# Patient Record
Sex: Male | Born: 1970 | Hispanic: Yes | Marital: Married | Smoking: Former smoker
Health system: Southern US, Community
[De-identification: ages and names within clinical notes are randomized; demographics above are authoritative.]

## PROBLEM LIST (undated history)

## (undated) DIAGNOSIS — T8859XA Other complications of anesthesia, initial encounter: Secondary | ICD-10-CM

## (undated) DIAGNOSIS — S3609XA Other injury of spleen, initial encounter: Secondary | ICD-10-CM

## (undated) DIAGNOSIS — F1011 Alcohol abuse, in remission: Secondary | ICD-10-CM

## (undated) DIAGNOSIS — K759 Inflammatory liver disease, unspecified: Secondary | ICD-10-CM

## (undated) DIAGNOSIS — F329 Major depressive disorder, single episode, unspecified: Secondary | ICD-10-CM

## (undated) DIAGNOSIS — T4145XA Adverse effect of unspecified anesthetic, initial encounter: Secondary | ICD-10-CM

## (undated) DIAGNOSIS — T50901A Poisoning by unspecified drugs, medicaments and biological substances, accidental (unintentional), initial encounter: Secondary | ICD-10-CM

## (undated) DIAGNOSIS — J45909 Unspecified asthma, uncomplicated: Secondary | ICD-10-CM

## (undated) DIAGNOSIS — F1020 Alcohol dependence, uncomplicated: Secondary | ICD-10-CM

## (undated) HISTORY — DX: Major depressive disorder, single episode, unspecified: F32.9

## (undated) HISTORY — PX: NO PAST SURGERIES: SHX2092

## (undated) HISTORY — PX: OTHER SURGICAL HISTORY: SHX169

---

## 2009-05-24 ENCOUNTER — Emergency Department (HOSPITAL_COMMUNITY): Admission: EM | Admit: 2009-05-24 | Discharge: 2009-05-24 | Payer: Self-pay | Admitting: Emergency Medicine

## 2009-05-31 ENCOUNTER — Emergency Department (HOSPITAL_COMMUNITY): Admission: EM | Admit: 2009-05-31 | Discharge: 2009-06-01 | Payer: Self-pay | Admitting: Internal Medicine

## 2009-07-19 ENCOUNTER — Emergency Department (HOSPITAL_COMMUNITY): Admission: EM | Admit: 2009-07-19 | Discharge: 2009-07-19 | Payer: Self-pay | Admitting: Emergency Medicine

## 2009-08-03 ENCOUNTER — Emergency Department (HOSPITAL_COMMUNITY): Admission: EM | Admit: 2009-08-03 | Discharge: 2009-08-03 | Payer: Self-pay | Admitting: Emergency Medicine

## 2009-08-13 ENCOUNTER — Encounter (INDEPENDENT_AMBULATORY_CARE_PROVIDER_SITE_OTHER): Payer: Self-pay | Admitting: Family Medicine

## 2009-08-13 ENCOUNTER — Ambulatory Visit: Payer: Self-pay | Admitting: Internal Medicine

## 2009-08-13 LAB — CONVERTED CEMR LAB
ALT: 87 units/L — ABNORMAL HIGH (ref 0–53)
AST: 159 units/L — ABNORMAL HIGH (ref 0–37)
Albumin: 4.3 g/dL (ref 3.5–5.2)
Alkaline Phosphatase: 92 units/L (ref 39–117)
BUN: 16 mg/dL (ref 6–23)
Basophils Absolute: 0 10*3/uL (ref 0.0–0.1)
Basophils Relative: 0 % (ref 0–1)
CO2: 26 meq/L (ref 19–32)
Calcium: 8.8 mg/dL (ref 8.4–10.5)
Chloride: 105 meq/L (ref 96–112)
Creatinine, Ser: 0.87 mg/dL (ref 0.40–1.50)
Eosinophils Absolute: 0.2 10*3/uL (ref 0.0–0.7)
Eosinophils Relative: 3 % (ref 0–5)
Glucose, Bld: 91 mg/dL (ref 70–99)
HCT: 45.5 % (ref 39.0–52.0)
HCV Ab: REACTIVE — AB
HCV Quantitative: 8190000 intl units/mL — ABNORMAL HIGH (ref <43)
Hemoglobin: 14.8 g/dL (ref 13.0–17.0)
Hep A Total Ab: NEGATIVE
Hep B Core Total Ab: POSITIVE — AB
Hep B S Ab: POSITIVE — AB
Lymphocytes Relative: 44 % (ref 12–46)
Lymphs Abs: 3 10*3/uL (ref 0.7–4.0)
MCHC: 32.5 g/dL (ref 30.0–36.0)
MCV: 97.6 fL (ref 78.0–100.0)
Monocytes Absolute: 1.2 10*3/uL — ABNORMAL HIGH (ref 0.1–1.0)
Monocytes Relative: 18 % — ABNORMAL HIGH (ref 3–12)
Neutro Abs: 2.4 10*3/uL (ref 1.7–7.7)
Neutrophils Relative %: 35 % — ABNORMAL LOW (ref 43–77)
Platelets: 210 10*3/uL (ref 150–400)
Potassium: 5 meq/L (ref 3.5–5.3)
RBC: 4.66 M/uL (ref 4.22–5.81)
RDW: 14.5 % (ref 11.5–15.5)
Sodium: 140 meq/L (ref 135–145)
Total Bilirubin: 0.8 mg/dL (ref 0.3–1.2)
Total Protein: 8 g/dL (ref 6.0–8.3)
WBC: 6.8 10*3/uL (ref 4.0–10.5)

## 2010-06-05 ENCOUNTER — Emergency Department (HOSPITAL_COMMUNITY)
Admission: EM | Admit: 2010-06-05 | Discharge: 2010-06-06 | Payer: Self-pay | Source: Home / Self Care | Admitting: Emergency Medicine

## 2010-06-30 ENCOUNTER — Emergency Department (HOSPITAL_COMMUNITY)
Admission: EM | Admit: 2010-06-30 | Discharge: 2010-07-01 | Payer: Self-pay | Source: Home / Self Care | Admitting: Emergency Medicine

## 2010-07-08 LAB — COMPREHENSIVE METABOLIC PANEL
ALT: 141 U/L — ABNORMAL HIGH (ref 0–53)
AST: 221 U/L — ABNORMAL HIGH (ref 0–37)
Albumin: 3.4 g/dL — ABNORMAL LOW (ref 3.5–5.2)
Alkaline Phosphatase: 116 U/L (ref 39–117)
BUN: 12 mg/dL (ref 6–23)
CO2: 22 mEq/L (ref 19–32)
Calcium: 8.5 mg/dL (ref 8.4–10.5)
Chloride: 106 mEq/L (ref 96–112)
Creatinine, Ser: 0.67 mg/dL (ref 0.4–1.5)
GFR calc Af Amer: >60 mL/min (ref >60)
GFR calc non Af Amer: >60 mL/min (ref >60)
Glucose, Bld: 94 mg/dL (ref 70–99)
Potassium: 3.7 mEq/L (ref 3.5–5.1)
Sodium: 137 mEq/L (ref 135–145)
Total Bilirubin: 0.7 mg/dL (ref 0.3–1.2)
Total Protein: 7.4 g/dL (ref 6.0–8.3)

## 2010-07-08 LAB — URINALYSIS, ROUTINE W REFLEX MICROSCOPIC
Bilirubin Urine: NEGATIVE
Hgb urine dipstick: NEGATIVE
Ketones, ur: NEGATIVE mg/dL
Nitrite: NEGATIVE
Protein, ur: NEGATIVE mg/dL
Specific Gravity, Urine: 1.018 (ref 1.005–1.030)
Urine Glucose, Fasting: NEGATIVE mg/dL
Urobilinogen, UA: 0.2 mg/dL (ref 0.0–1.0)
pH: 5.5 (ref 5.0–8.0)

## 2010-07-08 LAB — DIFFERENTIAL
Basophils Absolute: 0.1 10*3/uL (ref 0.0–0.1)
Basophils Relative: 1 % (ref 0–1)
Eosinophils Absolute: 0.4 10*3/uL (ref 0.0–0.7)
Eosinophils Relative: 4 % (ref 0–5)
Lymphocytes Relative: 50 % — ABNORMAL HIGH (ref 12–46)
Lymphs Abs: 4.1 10*3/uL — ABNORMAL HIGH (ref 0.7–4.0)
Monocytes Absolute: 1.4 10*3/uL — ABNORMAL HIGH (ref 0.1–1.0)
Monocytes Relative: 17 % — ABNORMAL HIGH (ref 3–12)
Neutro Abs: 2.3 10*3/uL (ref 1.7–7.7)
Neutrophils Relative %: 28 % — ABNORMAL LOW (ref 43–77)

## 2010-07-08 LAB — CBC
HCT: 42 % (ref 39.0–52.0)
Hemoglobin: 14.3 g/dL (ref 13.0–17.0)
MCH: 30.6 pg (ref 26.0–34.0)
MCHC: 34 g/dL (ref 30.0–36.0)
MCV: 89.9 fL (ref 78.0–100.0)
Platelets: 182 10*3/uL (ref 150–400)
RBC: 4.67 MIL/uL (ref 4.22–5.81)
RDW: 17.3 % — ABNORMAL HIGH (ref 11.5–15.5)
WBC: 8.4 10*3/uL (ref 4.0–10.5)

## 2010-07-08 LAB — LIPASE, BLOOD: Lipase: 46 U/L (ref 11–59)

## 2010-07-08 LAB — ETHANOL: Alcohol, Ethyl (B): 139 mg/dL — ABNORMAL HIGH (ref 0–10)

## 2010-07-08 LAB — CK: Total CK: 175 U/L (ref 7–232)

## 2010-07-21 ENCOUNTER — Emergency Department (HOSPITAL_COMMUNITY)
Admission: EM | Admit: 2010-07-21 | Discharge: 2010-07-21 | Payer: Self-pay | Source: Home / Self Care | Admitting: Emergency Medicine

## 2010-09-03 LAB — POCT I-STAT, CHEM 8
Calcium, Ion: 1.05 mmol/L — ABNORMAL LOW (ref 1.12–1.32)
Glucose, Bld: 88 mg/dL (ref 70–99)
HCT: 43 % (ref 39.0–52.0)
Hemoglobin: 14.6 g/dL (ref 13.0–17.0)

## 2010-09-03 LAB — CBC
HCT: 40.3 % (ref 39.0–52.0)
Hemoglobin: 13.5 g/dL (ref 13.0–17.0)
MCH: 29.7 pg (ref 26.0–34.0)
MCHC: 33.5 g/dL (ref 30.0–36.0)
MCV: 88.6 fL (ref 78.0–100.0)
Platelets: 229 10*3/uL (ref 150–400)
RBC: 4.55 MIL/uL (ref 4.22–5.81)
RDW: 17.8 % — ABNORMAL HIGH (ref 11.5–15.5)
WBC: 8.8 10*3/uL (ref 4.0–10.5)

## 2010-09-03 LAB — BASIC METABOLIC PANEL
BUN: 12 mg/dL (ref 6–23)
CO2: 24 mEq/L (ref 19–32)
Calcium: 8.6 mg/dL (ref 8.4–10.5)
Chloride: 111 mEq/L (ref 96–112)
Creatinine, Ser: 0.79 mg/dL (ref 0.4–1.5)
GFR calc Af Amer: >60 mL/min (ref >60)
GFR calc non Af Amer: >60 mL/min (ref >60)
Glucose, Bld: 89 mg/dL (ref 70–99)
Potassium: 3.6 mEq/L (ref 3.5–5.1)
Sodium: 139 mEq/L (ref 135–145)

## 2010-09-03 LAB — TYPE AND SCREEN
ABO/RH(D): O POS
Antibody Screen: NEGATIVE

## 2010-09-03 LAB — ABO/RH: ABO/RH(D): O POS

## 2010-09-03 LAB — URINALYSIS, ROUTINE W REFLEX MICROSCOPIC
Bilirubin Urine: NEGATIVE
Glucose, UA: NEGATIVE mg/dL
Hgb urine dipstick: NEGATIVE
Ketones, ur: NEGATIVE mg/dL
Nitrite: NEGATIVE
Protein, ur: NEGATIVE mg/dL
Specific Gravity, Urine: 1.037 — ABNORMAL HIGH (ref 1.005–1.030)
Urobilinogen, UA: 0.2 mg/dL (ref 0.0–1.0)
pH: 5.5 (ref 5.0–8.0)

## 2010-09-09 LAB — DIFFERENTIAL
Basophils Relative: 0 % (ref 0–1)
Lymphs Abs: 3.2 10*3/uL (ref 0.7–4.0)
Monocytes Absolute: 1.1 10*3/uL — ABNORMAL HIGH (ref 0.1–1.0)
Monocytes Relative: 16 % — ABNORMAL HIGH (ref 3–12)
Neutro Abs: 2.4 10*3/uL (ref 1.7–7.7)

## 2010-09-09 LAB — COMPREHENSIVE METABOLIC PANEL
Albumin: 3.6 g/dL (ref 3.5–5.2)
Alkaline Phosphatase: 69 U/L (ref 39–117)
BUN: 6 mg/dL (ref 6–23)
Calcium: 8.2 mg/dL — ABNORMAL LOW (ref 8.4–10.5)
Potassium: 4.4 mEq/L (ref 3.5–5.1)
Sodium: 141 mEq/L (ref 135–145)
Total Protein: 7.1 g/dL (ref 6.0–8.3)

## 2010-09-09 LAB — CBC
HCT: 41.2 % (ref 39.0–52.0)
MCHC: 33.6 g/dL (ref 30.0–36.0)
Platelets: 167 10*3/uL (ref 150–400)
RDW: 13.6 % (ref 11.5–15.5)

## 2010-09-09 LAB — URINALYSIS, ROUTINE W REFLEX MICROSCOPIC
Glucose, UA: NEGATIVE mg/dL
Hgb urine dipstick: NEGATIVE
Protein, ur: NEGATIVE mg/dL
Specific Gravity, Urine: 1.035 — ABNORMAL HIGH (ref 1.005–1.030)
pH: 5 (ref 5.0–8.0)

## 2010-09-24 LAB — URINALYSIS, ROUTINE W REFLEX MICROSCOPIC
Bilirubin Urine: NEGATIVE
Glucose, UA: NEGATIVE mg/dL
Glucose, UA: NEGATIVE mg/dL
Hgb urine dipstick: NEGATIVE
Nitrite: NEGATIVE
Protein, ur: NEGATIVE mg/dL
Specific Gravity, Urine: 1.012 (ref 1.005–1.030)
pH: 5.5 (ref 5.0–8.0)

## 2010-09-24 LAB — DIFFERENTIAL
Basophils Absolute: 0 10*3/uL (ref 0.0–0.1)
Basophils Absolute: 0.1 10*3/uL (ref 0.0–0.1)
Eosinophils Absolute: 0.2 10*3/uL (ref 0.0–0.7)
Eosinophils Relative: 2 % (ref 0–5)
Eosinophils Relative: 2 % (ref 0–5)
Lymphocytes Relative: 51 % — ABNORMAL HIGH (ref 12–46)
Lymphs Abs: 3.6 10*3/uL (ref 0.7–4.0)
Monocytes Absolute: 1 10*3/uL (ref 0.1–1.0)
Monocytes Relative: 14 % — ABNORMAL HIGH (ref 3–12)
Neutrophils Relative %: 35 % — ABNORMAL LOW (ref 43–77)

## 2010-09-24 LAB — URINE CULTURE
Colony Count: NO GROWTH
Culture: NO GROWTH

## 2010-09-24 LAB — COMPREHENSIVE METABOLIC PANEL
ALT: 96 U/L — ABNORMAL HIGH (ref 0–53)
AST: 157 U/L — ABNORMAL HIGH (ref 0–37)
AST: 178 U/L — ABNORMAL HIGH (ref 0–37)
Albumin: 3.7 g/dL (ref 3.5–5.2)
Alkaline Phosphatase: 79 U/L (ref 39–117)
BUN: 5 mg/dL — ABNORMAL LOW (ref 6–23)
CO2: 23 mEq/L (ref 19–32)
Calcium: 8.5 mg/dL (ref 8.4–10.5)
Chloride: 108 mEq/L (ref 96–112)
Chloride: 109 mEq/L (ref 96–112)
Creatinine, Ser: 0.8 mg/dL (ref 0.4–1.5)
GFR calc Af Amer: >60 mL/min (ref >60)
GFR calc Af Amer: >60 mL/min (ref >60)
GFR calc non Af Amer: >60 mL/min (ref >60)
GFR calc non Af Amer: >60 mL/min (ref >60)
Glucose, Bld: 82 mg/dL (ref 70–99)
Potassium: 4.2 mEq/L (ref 3.5–5.1)
Sodium: 142 mEq/L (ref 135–145)
Total Bilirubin: 0.7 mg/dL (ref 0.3–1.2)
Total Bilirubin: 0.9 mg/dL (ref 0.3–1.2)

## 2010-09-24 LAB — CBC
HCT: 43.5 % (ref 39.0–52.0)
Hemoglobin: 14.6 g/dL (ref 13.0–17.0)
MCHC: 33.1 g/dL (ref 30.0–36.0)
MCV: 99.8 fL (ref 78.0–100.0)
Platelets: 165 10*3/uL (ref 150–400)
RBC: 4.44 MIL/uL (ref 4.22–5.81)
WBC: 7.5 10*3/uL (ref 4.0–10.5)
WBC: 9.2 10*3/uL (ref 4.0–10.5)

## 2011-04-18 ENCOUNTER — Emergency Department (HOSPITAL_COMMUNITY)
Admission: EM | Admit: 2011-04-18 | Discharge: 2011-04-19 | Disposition: A | Discharge status: Home / Self Care | Payer: Self-pay | Attending: Emergency Medicine | Admitting: Emergency Medicine

## 2011-04-18 ENCOUNTER — Emergency Department (HOSPITAL_COMMUNITY): Payer: Self-pay

## 2011-04-18 DIAGNOSIS — F17200 Nicotine dependence, unspecified, uncomplicated: Secondary | ICD-10-CM | POA: Insufficient documentation

## 2011-04-18 DIAGNOSIS — F1010 Alcohol abuse, uncomplicated: Secondary | ICD-10-CM | POA: Insufficient documentation

## 2011-04-18 DIAGNOSIS — W1789XA Other fall from one level to another, initial encounter: Secondary | ICD-10-CM | POA: Insufficient documentation

## 2011-04-18 DIAGNOSIS — F1410 Cocaine abuse, uncomplicated: Secondary | ICD-10-CM | POA: Insufficient documentation

## 2011-04-18 DIAGNOSIS — M549 Dorsalgia, unspecified: Secondary | ICD-10-CM | POA: Insufficient documentation

## 2011-04-18 LAB — DIFFERENTIAL
Eosinophils Absolute: 0 10*3/uL (ref 0.0–0.7)
Lymphs Abs: 2 10*3/uL (ref 0.7–4.0)
Monocytes Absolute: 1 10*3/uL (ref 0.1–1.0)
Monocytes Relative: 16 % — ABNORMAL HIGH (ref 3–12)
Neutrophils Relative %: 49 % (ref 43–77)

## 2011-04-18 LAB — CBC
Hemoglobin: 15.2 g/dL (ref 13.0–17.0)
MCH: 32.5 pg (ref 26.0–34.0)
MCV: 97.6 fL (ref 78.0–100.0)
RBC: 4.68 MIL/uL (ref 4.22–5.81)

## 2011-04-18 LAB — RAPID URINE DRUG SCREEN, HOSP PERFORMED
Opiates: POSITIVE — AB
Tetrahydrocannabinol: NOT DETECTED

## 2011-04-18 LAB — URINALYSIS, ROUTINE W REFLEX MICROSCOPIC
Glucose, UA: NEGATIVE mg/dL
Ketones, ur: NEGATIVE mg/dL
pH: 6 (ref 5.0–8.0)

## 2011-04-18 LAB — COMPREHENSIVE METABOLIC PANEL
Alkaline Phosphatase: 85 U/L (ref 39–117)
BUN: 10 mg/dL (ref 6–23)
CO2: 23 mEq/L (ref 19–32)
Chloride: 103 mEq/L (ref 96–112)
GFR calc Af Amer: >90 mL/min (ref >90)
Glucose, Bld: 96 mg/dL (ref 70–99)
Potassium: 4.2 mEq/L (ref 3.5–5.1)
Total Bilirubin: 1 mg/dL (ref 0.3–1.2)

## 2011-05-26 ENCOUNTER — Encounter: Payer: Self-pay | Attending: Neurosurgery | Admitting: Neurosurgery

## 2011-05-26 DIAGNOSIS — M549 Dorsalgia, unspecified: Secondary | ICD-10-CM | POA: Insufficient documentation

## 2011-05-26 DIAGNOSIS — G894 Chronic pain syndrome: Secondary | ICD-10-CM

## 2011-05-26 DIAGNOSIS — Z9089 Acquired absence of other organs: Secondary | ICD-10-CM | POA: Insufficient documentation

## 2011-05-26 DIAGNOSIS — R109 Unspecified abdominal pain: Secondary | ICD-10-CM | POA: Insufficient documentation

## 2011-05-26 DIAGNOSIS — R209 Unspecified disturbances of skin sensation: Secondary | ICD-10-CM | POA: Insufficient documentation

## 2011-05-26 DIAGNOSIS — M7989 Other specified soft tissue disorders: Secondary | ICD-10-CM | POA: Insufficient documentation

## 2011-05-26 DIAGNOSIS — M546 Pain in thoracic spine: Secondary | ICD-10-CM
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
## 2011-06-01 ENCOUNTER — Emergency Department (HOSPITAL_COMMUNITY)
Admission: EM | Admit: 2011-06-01 | Discharge: 2011-06-02 | Disposition: A | Discharge status: Other Acute Inpatient Hospital | Payer: Self-pay | Attending: Emergency Medicine | Admitting: Emergency Medicine

## 2011-06-01 DIAGNOSIS — F329 Major depressive disorder, single episode, unspecified: Secondary | ICD-10-CM | POA: Insufficient documentation

## 2011-06-01 DIAGNOSIS — S60222A Contusion of left hand, initial encounter: Secondary | ICD-10-CM

## 2011-06-01 DIAGNOSIS — S60229A Contusion of unspecified hand, initial encounter: Secondary | ICD-10-CM | POA: Insufficient documentation

## 2011-06-01 DIAGNOSIS — X838XXA Intentional self-harm by other specified means, initial encounter: Secondary | ICD-10-CM | POA: Insufficient documentation

## 2011-06-01 DIAGNOSIS — F10929 Alcohol use, unspecified with intoxication, unspecified: Secondary | ICD-10-CM

## 2011-06-01 DIAGNOSIS — M79609 Pain in unspecified limb: Secondary | ICD-10-CM | POA: Insufficient documentation

## 2011-06-01 DIAGNOSIS — R45851 Suicidal ideations: Secondary | ICD-10-CM

## 2011-06-01 DIAGNOSIS — F3289 Other specified depressive episodes: Secondary | ICD-10-CM | POA: Insufficient documentation

## 2011-06-01 DIAGNOSIS — F1010 Alcohol abuse, uncomplicated: Secondary | ICD-10-CM | POA: Insufficient documentation

## 2011-06-01 HISTORY — DX: Inflammatory liver disease, unspecified: K75.9

## 2011-06-01 LAB — COMPREHENSIVE METABOLIC PANEL
Alkaline Phosphatase: 87 U/L (ref 39–117)
BUN: 9 mg/dL (ref 6–23)
CO2: 23 mEq/L (ref 19–32)
Chloride: 105 mEq/L (ref 96–112)
Creatinine, Ser: 0.67 mg/dL (ref 0.50–1.35)
GFR calc Af Amer: >90 mL/min (ref >90)
GFR calc non Af Amer: >90 mL/min (ref >90)
Glucose, Bld: 83 mg/dL (ref 70–99)
Potassium: 3.6 mEq/L (ref 3.5–5.1)
Total Bilirubin: 0.4 mg/dL (ref 0.3–1.2)

## 2011-06-01 LAB — ETHANOL: Alcohol, Ethyl (B): 376 mg/dL — ABNORMAL HIGH (ref 0–11)

## 2011-06-01 LAB — SALICYLATE LEVEL: Salicylate Lvl: <2 mg/dL — ABNORMAL LOW (ref 2.8–20.0)

## 2011-06-01 LAB — CBC
HCT: 41.2 % (ref 39.0–52.0)
Hemoglobin: 14.5 g/dL (ref 13.0–17.0)
MCV: 93.6 fL (ref 78.0–100.0)
WBC: 6.6 10*3/uL (ref 4.0–10.5)

## 2011-06-01 LAB — RAPID URINE DRUG SCREEN, HOSP PERFORMED
Barbiturates: NOT DETECTED
Benzodiazepines: NOT DETECTED

## 2011-06-01 NOTE — ED Notes (Signed)
Report per EMS: Girlfriend reports possible OD and SI. Pt has hx of similar behaviors.

## 2011-06-01 NOTE — ED Provider Notes (Signed)
History     CSN: [id number] Arrival date & time: 06/01/2011 10:49 PM   First MD Initiated Contact with Patient 06/01/11 2353      Chief Complaint  Patient presents with  . Psychiatric Evaluation    (Consider location/radiation/quality/duration/timing/severity/associated sxs/prior treatment) HPI Level V caveat: Intoxication Principal historian: Significant other This is a 41 year old Hispanic male with a history of depression. The depression was acutely exacerbated by his mother having a stroke about a week ago. He has been drinking alcohol in an attempt to self medicate. He has been having thoughts of suicide. He has been punching objects in an attempt to his anger; he has pain and swelling over his left middle MCP joint as a result of punching. He has not harmed her expressed interest in harming others. He denies street drug use. He did pass out this evening after drinking heavily. The symptoms are described as moderate to severe. It is unclear if he has been hearing voices but he has been talking to himself in Spanish, which his girlfriend does not understand.  No past medical history on file.  No past surgical history on file.  No family history on file.  History  Substance Use Topics  . Smoking status: Not on file  . Smokeless tobacco: Not on file  . Alcohol Use: Heavy      Review of Systems  All other systems reviewed and are negative.    Allergies  Sulfa antibiotics  Home Medications   Current Outpatient Rx  Name Route Sig Dispense Refill  . NAPROXEN 500 MG PO TABS Oral Take 500 mg by mouth 2 (two) times daily with a meal.        BP 109/62  Pulse 68  Temp(Src) 98.5 F (36.9 C) (Oral)  Resp 17  SpO2 99%  Physical Exam General: Well-developed, well-nourished male in no acute distress; appearance consistent with age of record;  Somnolent but arousable HENT: normocephalic, atraumatic; breath smells of alcohol Eyes: pupils equal round and reactive to light;  extraocular muscles intact; arcus senilis bilaterally Neck: supple Heart: regular rate and rhythm Lungs: clear to auscultation bilaterally Abdomen: soft; nontender; nondistended Extremities: No deformity; full range of motion; pulses normal Neurologic: Motor intact in all extremities; ataxia; dysarthria Skin: Warm and dry Psychiatric: Depressed; angry affect; suicidal ideation    ED Course  Procedures (including critical care time)     MDM   Nursing notes and vitals signs, including pulse oximetry, reviewed.  Summary of this visit's results, reviewed by myself:  Labs:  Results for orders placed during the hospital encounter of 06/01/11  CBC      Component Value Range   WBC 6.6  4.0 - 10.5 (K/uL)   RBC 4.40  4.22 - 5.81 (MIL/uL)   Hemoglobin 14.5  13.0 - 17.0 (g/dL)   HCT 40.9  81.1 - 91.4 (%)   MCV 93.6  78.0 - 100.0 (fL)   MCH 33.0  26.0 - 34.0 (pg)   MCHC 35.2  30.0 - 36.0 (g/dL)   RDW 78.2  95.6 - 21.3 (%)   Platelets 148 (*) 150 - 400 (K/uL)  COMPREHENSIVE METABOLIC PANEL      Component Value Range   Sodium 140  135 - 145 (mEq/L)   Potassium 3.6  3.5 - 5.1 (mEq/L)   Chloride 105  96 - 112 (mEq/L)   CO2 23  19 - 32 (mEq/L)   Glucose, Bld 83  70 - 99 (mg/dL)   BUN 9  6 -  23 (mg/dL)   Creatinine, Ser 9.14  0.50 - 1.35 (mg/dL)   Calcium 9.2  8.4 - 78.2 (mg/dL)   Total Protein 8.8 (*) 6.0 - 8.3 (g/dL)   Albumin 4.1  3.5 - 5.2 (g/dL)   AST 956 (*) 0 - 37 (U/L)   ALT 134 (*) 0 - 53 (U/L)   Alkaline Phosphatase 87  39 - 117 (U/L)   Total Bilirubin 0.4  0.3 - 1.2 (mg/dL)   GFR calc non Af Amer >90  >90 (mL/min)   GFR calc Af Amer >90  >90 (mL/min)  ETHANOL      Component Value Range   Alcohol, Ethyl (B) 376 (*) 0 - 11 (mg/dL)  ACETAMINOPHEN LEVEL      Component Value Range   Acetaminophen (Tylenol), Serum <15.0  10 - 30 (ug/mL)  URINE RAPID DRUG SCREEN (HOSP PERFORMED)      Component Value Range   Opiates NONE DETECTED  NONE DETECTED    Cocaine NONE DETECTED   NONE DETECTED    Benzodiazepines NONE DETECTED  NONE DETECTED    Amphetamines NONE DETECTED  NONE DETECTED    Tetrahydrocannabinol NONE DETECTED  NONE DETECTED    Barbiturates NONE DETECTED  NONE DETECTED   SALICYLATE LEVEL      Component Value Range   Salicylate Lvl <2.0 (*) 2.8 - 20.0 (mg/dL)   Dg Hand Complete Left  06/02/2011  *RADIOLOGY REPORT*  Clinical Data: Punched wall.  Pain.  LEFT HAND - COMPLETE 3+ VIEW  Comparison: None.  Findings: There is no evidence for an acute fracture. No subluxation or dislocation.  Joint spaces are preserved.  No worrisome lytic or sclerotic osseous abnormality.  IMPRESSION: Normal exam.  Original Report Authenticated By: ERIC A. MANSELL, M.D.   7:58 AM Second alcohol level pending. Dr. Patrica Duel will consult [facility] once his alcohol is below 200.        Hanley Seamen, MD 06/02/11 [id number]

## 2011-06-01 NOTE — ED Notes (Signed)
ZOX:[id number]<AV> Expected date:06/01/11<BR> Expected time:10:15 PM<BR> Means of arrival:Ambulance<BR> Comments:<BR> EMS 10 GC - Psych pt

## 2011-06-02 ENCOUNTER — Encounter (HOSPITAL_COMMUNITY): Payer: Self-pay | Admitting: *Deleted

## 2011-06-02 ENCOUNTER — Emergency Department (HOSPITAL_COMMUNITY): Payer: Self-pay

## 2011-06-02 ENCOUNTER — Encounter (HOSPITAL_COMMUNITY): Payer: Self-pay

## 2011-06-02 ENCOUNTER — Inpatient Hospital Stay (HOSPITAL_COMMUNITY)
Admission: AD | Admit: 2011-06-02 | Discharge: 2011-06-06 | DRG: 897 | Disposition: A | Discharge status: Home / Self Care | Payer: PRIVATE HEALTH INSURANCE | Source: Ambulatory Visit | Attending: Psychiatry | Admitting: Psychiatry

## 2011-06-02 DIAGNOSIS — B1920 Unspecified viral hepatitis C without hepatic coma: Secondary | ICD-10-CM

## 2011-06-02 DIAGNOSIS — S3609XA Other injury of spleen, initial encounter: Secondary | ICD-10-CM | POA: Insufficient documentation

## 2011-06-02 DIAGNOSIS — F17200 Nicotine dependence, unspecified, uncomplicated: Secondary | ICD-10-CM

## 2011-06-02 DIAGNOSIS — F1010 Alcohol abuse, uncomplicated: Principal | ICD-10-CM

## 2011-06-02 DIAGNOSIS — M549 Dorsalgia, unspecified: Secondary | ICD-10-CM

## 2011-06-02 DIAGNOSIS — R45851 Suicidal ideations: Secondary | ICD-10-CM

## 2011-06-02 DIAGNOSIS — Z79899 Other long term (current) drug therapy: Secondary | ICD-10-CM

## 2011-06-02 DIAGNOSIS — Z882 Allergy status to sulfonamides status: Secondary | ICD-10-CM

## 2011-06-02 HISTORY — DX: Other injury of spleen, initial encounter: S36.09XA

## 2011-06-02 LAB — ETHANOL: Alcohol, Ethyl (B): 127 mg/dL — ABNORMAL HIGH (ref 0–11)

## 2011-06-02 MED ORDER — ONDANSETRON HCL 4 MG PO TABS: 4.0000 mg | ORAL_TABLET | Freq: Three times a day (TID) | ORAL | Status: DC | PRN

## 2011-06-02 MED ORDER — CHLORDIAZEPOXIDE HCL 25 MG PO CAPS
25.0000 mg | ORAL_CAPSULE | Freq: Four times a day (QID) | ORAL | Status: AC
  Administered 2011-06-02 – 2011-06-04 (×2): 25 mg via ORAL
  Filled 2011-06-02: qty 1

## 2011-06-02 MED ORDER — VITAMIN B-1 100 MG PO TABS
100.0000 mg | ORAL_TABLET | Freq: Every day | ORAL | Status: DC
  Administered 2011-06-03 – 2011-06-06 (×4): 100 mg via ORAL
  Filled 2011-06-02 (×6): qty 1

## 2011-06-02 MED ORDER — CHLORDIAZEPOXIDE HCL 25 MG PO CAPS
25.0000 mg | ORAL_CAPSULE | Freq: Three times a day (TID) | ORAL | Status: AC
  Administered 2011-06-04: 25 mg via ORAL

## 2011-06-02 MED ORDER — CHLORDIAZEPOXIDE HCL 25 MG PO CAPS
25.0000 mg | ORAL_CAPSULE | Freq: Every day | ORAL | Status: AC
  Administered 2011-06-06: 25 mg via ORAL
  Filled 2011-06-02: qty 1

## 2011-06-02 MED ORDER — ONDANSETRON 4 MG PO TBDP: 4.0000 mg | ORAL_TABLET | Freq: Four times a day (QID) | ORAL | Status: DC | PRN

## 2011-06-02 MED ORDER — CHLORDIAZEPOXIDE HCL 25 MG PO CAPS: 25.0000 mg | ORAL_CAPSULE | Freq: Four times a day (QID) | ORAL | Status: DC | PRN

## 2011-06-02 MED ORDER — ACETAMINOPHEN 325 MG PO TABS
650.0000 mg | ORAL_TABLET | Freq: Four times a day (QID) | ORAL | Status: DC | PRN
  Administered 2011-06-02 – 2011-06-05 (×4): 650 mg via ORAL

## 2011-06-02 MED ORDER — LORAZEPAM 1 MG PO TABS
1.0000 mg | ORAL_TABLET | Freq: Four times a day (QID) | ORAL | Status: DC | PRN
  Administered 2011-06-02 (×2): 1 mg via ORAL
  Filled 2011-06-02 (×2): qty 1

## 2011-06-02 MED ORDER — HYDROXYZINE PAMOATE 25 MG PO CAPS: 100.0000 mg | ORAL_CAPSULE | Freq: Every evening | ORAL | Status: DC | PRN

## 2011-06-02 MED ORDER — MAGNESIUM HYDROXIDE 400 MG/5ML PO SUSP: 30.0000 mL | Freq: Every day | ORAL | Status: DC | PRN

## 2011-06-02 MED ORDER — PNEUMOCOCCAL VAC POLYVALENT 25 MCG/0.5ML IJ INJ
0.5000 mL | INJECTION | INTRAMUSCULAR | Status: AC
  Administered 2011-06-03: 0.5 mL via INTRAMUSCULAR

## 2011-06-02 MED ORDER — CHLORDIAZEPOXIDE HCL 25 MG PO CAPS
25.0000 mg | ORAL_CAPSULE | Freq: Three times a day (TID) | ORAL | Status: AC
  Administered 2011-06-04: 25 mg via ORAL
  Filled 2011-06-02 (×2): qty 1

## 2011-06-02 MED ORDER — CHLORDIAZEPOXIDE HCL 25 MG PO CAPS: 25.0000 mg | ORAL_CAPSULE | Freq: Every day | ORAL | Status: DC

## 2011-06-02 MED ORDER — ONDANSETRON 4 MG PO TBDP
4.0000 mg | ORAL_TABLET | Freq: Four times a day (QID) | ORAL | Status: AC | PRN
Start: 2011-06-02 — End: 2011-06-05

## 2011-06-02 MED ORDER — THERA M PLUS PO TABS: 1.0000 | ORAL_TABLET | Freq: Every day | ORAL | Status: DC

## 2011-06-02 MED ORDER — CHLORDIAZEPOXIDE HCL 25 MG PO CAPS
25.0000 mg | ORAL_CAPSULE | Freq: Four times a day (QID) | ORAL | Status: AC
  Administered 2011-06-02 – 2011-06-03 (×5): 25 mg via ORAL
  Filled 2011-06-02 (×7): qty 1

## 2011-06-02 MED ORDER — HYDROXYZINE HCL 25 MG PO TABS
25.0000 mg | ORAL_TABLET | Freq: Four times a day (QID) | ORAL | Status: AC | PRN
  Administered 2011-06-02: 100 mg via ORAL

## 2011-06-02 MED ORDER — VITAMIN B-1 100 MG PO TABS: 100.0000 mg | ORAL_TABLET | Freq: Every day | ORAL | Status: DC

## 2011-06-02 MED ORDER — THIAMINE HCL 100 MG/ML IJ SOLN: 100.0000 mg | Freq: Once | INTRAMUSCULAR | Status: DC

## 2011-06-02 MED ORDER — CHLORDIAZEPOXIDE HCL 25 MG PO CAPS
25.0000 mg | ORAL_CAPSULE | ORAL | Status: AC
  Administered 2011-06-05 – 2011-06-06 (×2): 25 mg via ORAL
  Filled 2011-06-02: qty 1

## 2011-06-02 MED ORDER — HYDROXYZINE HCL 25 MG PO TABS: 25.0000 mg | ORAL_TABLET | Freq: Four times a day (QID) | ORAL | Status: AC | PRN

## 2011-06-02 MED ORDER — ALUM & MAG HYDROXIDE-SIMETH 200-200-20 MG/5ML PO SUSP
30.0000 mL | ORAL | Status: DC | PRN
  Administered 2011-06-02: 30 mL via ORAL

## 2011-06-02 MED ORDER — LOPERAMIDE HCL 2 MG PO CAPS
2.0000 mg | ORAL_CAPSULE | ORAL | Status: AC | PRN
  Administered 2011-06-02 – 2011-06-03 (×2): 4 mg via ORAL

## 2011-06-02 MED ORDER — CHLORDIAZEPOXIDE HCL 25 MG PO CAPS
25.0000 mg | ORAL_CAPSULE | ORAL | Status: AC
  Administered 2011-06-05 (×2): 25 mg via ORAL
  Filled 2011-06-02: qty 1

## 2011-06-02 MED ORDER — INFLUENZA VIRUS VACC SPLIT PF IM SUSP
0.5000 mL | INTRAMUSCULAR | Status: AC
  Administered 2011-06-03: 0.5 mL via INTRAMUSCULAR

## 2011-06-02 MED ORDER — IBUPROFEN 600 MG PO TABS
600.0000 mg | ORAL_TABLET | Freq: Three times a day (TID) | ORAL | Status: DC | PRN
  Administered 2011-06-02: 600 mg via ORAL
  Filled 2011-06-02: qty 1

## 2011-06-02 MED ORDER — THIAMINE HCL 100 MG/ML IJ SOLN
Freq: Once | INTRAVENOUS | Status: AC
  Administered 2011-06-02: 01:00:00 via INTRAVENOUS
  Filled 2011-06-02: qty 1000

## 2011-06-02 MED ORDER — LOPERAMIDE HCL 2 MG PO CAPS: 2.0000 mg | ORAL_CAPSULE | ORAL | Status: DC | PRN

## 2011-06-02 MED ORDER — CHLORDIAZEPOXIDE HCL 25 MG PO CAPS
25.0000 mg | ORAL_CAPSULE | Freq: Four times a day (QID) | ORAL | Status: AC | PRN
  Administered 2011-06-04: 25 mg via ORAL

## 2011-06-02 MED ORDER — THERA M PLUS PO TABS
1.0000 | ORAL_TABLET | Freq: Every day | ORAL | Status: DC
  Administered 2011-06-02: 18:00:00 via ORAL
  Administered 2011-06-03 – 2011-06-06 (×4): 1 via ORAL
  Filled 2011-06-02 (×5): qty 1

## 2011-06-02 MED ORDER — LORAZEPAM 2 MG/ML IJ SOLN: 1.0000 mg | Freq: Four times a day (QID) | INTRAMUSCULAR | Status: DC | PRN

## 2011-06-02 NOTE — ED Notes (Signed)
Called report to [place] at [facility]  BH ready.

## 2011-06-02 NOTE — ED Notes (Signed)
Denies suicidal/homicidal thoughts, denies plan, c/o lower back pain scale 3/10 r/t chronic back pain, girlfriend remained on bedside, maintained on suicide precautions
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
## 2011-06-02 NOTE — ED Notes (Signed)
Patient denies pain and is resting comfortably. Girlfriend remained on bedside

## 2011-06-02 NOTE — Tx Team (Signed)
Initial Interdisciplinary Treatment Plan  PATIENT STRENGTHS: (choose at least two) Ability for insight Average or above average intelligence Communication skills General fund of knowledge Motivation for treatment/growth Physical Health Religious Affiliation Supportive family/friends Work skills  PATIENT STRESSORS: Substance abuse   PROBLEM LIST: Problem List/Patient Goals Date to be addressed Date deferred Reason deferred Estimated date of resolution  Substance abuse 06/02/11     Auditory hallucinations 06/02/11                                                DISCHARGE CRITERIA:  Ability to meet basic life and health needs Improved stabilization in mood, thinking, and/or behavior Motivation to continue treatment in a less acute level of care Verbal commitment to aftercare and medication compliance Withdrawal symptoms are absent or subacute and managed without 24-hour nursing intervention  PRELIMINARY DISCHARGE PLAN: Attend 12-step recovery group Return to previous living arrangement  PATIENT/FAMIILY INVOLVEMENT: This treatment plan has been presented to and reviewed with the patient, Antonio [name], and/or family member,  The patient and family have been given the opportunity to ask questions and make suggestions.  Antonio [name] 06/02/2011, 4:08 PM

## 2011-06-02 NOTE — Consult Note (Signed)
Patient Identification:  Silviano Neuser Date of Evaluation:  06/02/2011   History of Present Illness:  Patient seen in assessment reviewed.  40 year old male with history of polysubstance dependence who is currently abusing alcohol. Patient is guarded during the interview he reported suicidal ideations , he is logical and goal-directed able to make conversation does not seem to be responding to inner stimuli. Patient's friend with whom  he is  Living  is very concerned about him as he is not to eating or sleeping well.  Patient is alert awake oriented x3. Memory fair attention concentration fair insight and judgment poor.   AST, ALT HIGH.  Past Medical History:     Past Medical History  Diagnosis Date  . Hepatitis       No past surgical history on file.  Allergies:  Allergies  Allergen Reactions  . Sulfa Antibiotics     Current Medications:  Prior to Admission medications   Medication Sig Start Date End Date Taking? Authorizing Provider  naproxen (NAPROSYN) 500 MG tablet Take 500 mg by mouth 2 (two) times daily with a meal.     Yes Historical Provider, MD    Social History:    does not have a smoking history on file. He does not have any smokeless tobacco history on file. He reports that he drinks alcohol. His drug history not on file.   Family History:    No family history on file.   DIAGNOSIS:   AXIS I  chronic alcohol dependence   AXIS II  Deffered  AXIS III See medical notes.  AXIS IV  alcohol abuse    35-40     Recommendations: #1 patient will be admitted in the inpatient setting for further stabilization. #2 Ativan detox protocol will be continued I will not start a Librium because of the increase in AST and ALT.    Eulogio Ditch, MD

## 2011-06-02 NOTE — H&P (Signed)
Psychiatric Admission Assessment Adult  Patient Identification:  Antonio [name] Date of Evaluation:  06/02/2011  History of Present Illness:: 40 yo DHM .  Presented to ED yesterday intoxicated with an alcohol level of 376. UDS was negative for All substances. He reports relapsing on alcohol about 4 mos ago.He fell off a ladder hurting his back and has actually been prescribed pain meds from the [facility] here [facility] [address]. ACT Intake reprots substance abuse of cocaine and heroin he denies -although he does buy opiates from the street.Has been detoxed in [place].When he came into the ED he had "blacked out" and had been punching things as he was angry at not being able to go see his mother. Told him this is not a Gaffer and he laughed- he understood this is not a soap opera from TV. I encouraged his GF ( a disabled vet) to call [employer] and see what is available to help him go see his mother. This is the first time he "blacked out" denies withdrawal seizures or DT's. Does have a very mild hand tremor but no other obvious signs of withdrawal.    Past Psychiatric History:One prior  detox denies psychiatric care.   Substance Abuse History:Abuses beer started drinking age 62. Had been sober 4 mos this time prior to relapse after injuring back -fell off a ladder.   Social History:    reports that he has been smoking Cigarettes.  He has a 15 pack-year smoking history. He does not have any smokeless tobacco history on file. He reports that he drinks about 50.4 ounces of alcohol per week. He reports that he uses illicit drugs (Hydrocodone) about 28 times per week. UDS neg ETOH 376 then 127 AST 239 ALT 134.  Moved here from [place] about  6 years ago. Has been in this relationship about 4 years. 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  Went to the 7th grade and has 6 children daughters aged 65 16 &9 sons 1 11 &5.  Family Psych History:Denies . Mother had second stroke a week ago and he  felt terrible that he couldn't afford to fly home to [place]   Past Medical History:     Past Medical History  Diagnosis Date  . Hepatitis     hep b and c       Past Surgical History  Procedure Date  . No past surgeries     spleen removed 5 yrs ago    Allergies:  Allergies  Allergen Reactions  . Sulfa Antibiotics     Current Medications:  Prior to Admission medications   Medication Sig Start Date End Date Taking? Authorizing Provider  HYDROcodone-acetaminophen (NORCO) 10-325 MG per tablet Take 1 tablet by mouth every 6 (six) hours as needed.     Yes Historical Provider, MD  naproxen (NAPROSYN) 500 MG tablet Take 500 mg by mouth 2 (two) times daily with a meal.      Historical Provider, MD    Mental Status Examination/Evaluation: Objective:  Appearance: Fairly Groomed  Psychomotor Activity:  Normal  Eye Contact::  Good  Speech:  Clear and Coherent  Volume:  Normal  Mood: good smiles easily and frequently    Affect:  Congruent  Thought Process:  Clear rational goal oriented -see momma   Orientation:  Full  Thought Content:  No AVH  Was drunk   Suicidal Thoughts:  No  Homicidal Thoughts:  No  Judgement:  Intact  Insight:  Good  DIAGNOSIS:    AXIS I Alcohol Abuse  AXIS II Deferred  AXIS III Hepatitis  Back pain -fell from ladder 4mos ago   AXIS IV economic problems, educational problems, occupational problems and other psychosocial or environmental problems  AXIS V 41-50 serious symptoms    Treatment Plan Summary:  Acute detox had been started in ED. Agree with their H&P and ROS. Admit for medically assisted alcohol detox with low dose librium protocol. GF will call in pharmacy name so pain med can be verified. Wants case manager to help him get Medicaid and identify assistance to help him go see his mother in [place] Assess for antidepressant -once detoxed   [employer] PA-C

## 2011-06-02 NOTE — ED Notes (Signed)
Patient has been accepted to [facility] by Dr. Rogers Blocker. Rm 305-2

## 2011-06-02 NOTE — ED Notes (Addendum)
Denies si, denies plan

## 2011-06-02 NOTE — ED Notes (Signed)
Pt awake, girlfriend on bedside, pt is verbally abusive @ this time, c/o lower back pain, refused to rate pain

## 2011-06-02 NOTE — ED Notes (Signed)
Patient pending acceptance at [facility]
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
## 2011-06-02 NOTE — ED Notes (Signed)
Antonio [name] states that she is patient's friend that he lives with. States that the patient recently had a stroke that sent the patient into a depressive state that caused him to drink. States that patient has not used IV drugs in 5-6 years. States that she sent him to detox a year or so ago and at that time the patient was also taking pills. States that due to the patient having medical problems (chronic back pain, cirrhosis, and hepatitis) causes the patient to be depressed as well. Home # [phone] Cell # [phone].

## 2011-06-02 NOTE — ED Notes (Signed)
Pt. Lives with Para March, she can be reached at [phone] or cell- [phone].  She states that she may be of help to the ACT team.

## 2011-06-02 NOTE — ED Notes (Signed)
C/o lower back pain, refused to take motrin (+) agitation (+)restlessness, offered ativan 1 mg po, pt abled to take ativan 1 mg po without complaints

## 2011-06-02 NOTE — ED Notes (Signed)
Girlfriend's contact nos. Home [phone]    Cell [phone]

## 2011-06-02 NOTE — ED Notes (Signed)
Pt. Has 1 bag of belongings locked in psych ed activity room.

## 2011-06-03 DIAGNOSIS — F1020 Alcohol dependence, uncomplicated: Secondary | ICD-10-CM

## 2011-06-03 DIAGNOSIS — F10239 Alcohol dependence with withdrawal, unspecified: Secondary | ICD-10-CM

## 2011-06-03 MED ORDER — HYDROXYZINE HCL 50 MG PO TABS
100.0000 mg | ORAL_TABLET | Freq: Every evening | ORAL | Status: DC | PRN
  Administered 2011-06-03 – 2011-06-05 (×3): 100 mg via ORAL
  Filled 2011-06-03: qty 2
  Filled 2011-06-03: qty 1

## 2011-06-03 NOTE — Progress Notes (Signed)
Suicide Risk Assessment  Admission Assessment     Demographic factors:  See chart.  Current Mental Status: Pt stated that he is doing "well". His affect was mood congruent and euthymic. He denied any difficulties with detox. He is tolerating the Librium taper well. He denied any past history of self injurious behavior or suicidal ideation. He has a history of agitation. Nevertheless, he has been stable on the unit. He denied any current thoughts of self injurious behavior, suicidal ideation or homicidal ideation. He denied any auditory or visual hallucinations, paranoia or delusional thought processes. His eye contact was good and his speech is normal rate, tone and volume. He was pleasant and cooperative.    Loss Factors: mother s/p cva     Historical Factors: Denied hx self harm or suicide.  SUDs.   Risk Reduction Factors:  Risk Reduction Factors: Responsible for children under 68 years of age;Employed;Living with another person, especially a relative;Positive therapeutic relationship  CLINICAL FACTORS: Alcohol Dependence & Withdrawal; Hepatitis; Chronic Pain   COGNITIVE FEATURES THAT CONTRIBUTE TO RISK: none.    SUICIDE RISK: Patient is currently viewed as a low risk of harm to himself and others in light of his history and risk factors. There are no acute safety concerns on the unit.    PLAN OF CARE: Pt admitted for crisis stabilization and treatment.  Please see orders.   Medications reviewed with pt and medication education provided.  Will continue q15 minute checks per unit protocol.  No clinical indication for one on one level of observation at this time.  Pt contracting for safety.  Mental health treatment, medication management and continued sobriety will mitigate against the increased risk of harm to self and/or others.  Discussed the importance of recovery with pt, as well as, tools to move forward in a healthy & safe manner.  Pt agreeable with the plan.  Discussed with the team.    Lupe Carney 06/03/2011, 3:24 PM

## 2011-06-03 NOTE — Progress Notes (Signed)
Patient interacting well with peers and staff. His mood and affect seemed appropriate. He denied SI/HI and denied hallucinations. Reported that his diarrhea has stopped and he is feeling better. Patient received imodium earlier for diarrhea per report from day nurse. Q 15 minute check continues to maintain safety.

## 2011-06-03 NOTE — Progress Notes (Signed)
Pt is active on the unit and attending the scheduled groups. Denies SI/HI or feeling hopeless. Wrote "no depression" by question four on his self inventory. Eating and sleeping well. Interacting with peers on the unit. Patient is on day one of the librium detox protocol. He is taking the scheduled librium. He is noted to have some mild tremors and diarrhea. Received antidiarrheal medication at 13:46. His affect is very bright during interactions and he displays a sense of humor. Remains safe on the unit.

## 2011-06-03 NOTE — Progress Notes (Signed)
Recreation Therapy Group Note  Date: 06/03/11         Time: 1415      Group Topic/Focus: The focus of this group is on enhancing the patient's understanding of leisure, barriers to leisure, and the importance of engaging in positive leisure activities upon discharge for improved total health.  Participation Level: Active  Participation Quality: Appropriate and Attentive  Affect: Appropriate  Cognitive: Appropriate   Additional Comments: None.   Ninah Moccio 06/03/2011 4:07 PM
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
## 2011-06-03 NOTE — Progress Notes (Signed)
Pt is a 40 yr old male that appears appropriate in affect and mood. Pt reports no symptoms of withdrawal at this time. Pt however has been observed to be mildly anxious. Pt is negative for SI at this time. Pt interacts with others well and is in attendance for this evenings group at this time. Extended support and availability as needed has been extendend to the patient. Patient safety remains with q55min checks.

## 2011-06-04 DIAGNOSIS — F1010 Alcohol abuse, uncomplicated: Principal | ICD-10-CM

## 2011-06-04 NOTE — Progress Notes (Signed)
Interdisciplinary Treatment Plan Update (Adult)  Date: 06/04/2011  Time Reviewed: 8:29 AM   Progress in Treatment: Attending groups: Yes Participating in groups: Yes Taking medication as prescribed: Yes Tolerating medication: Yes   Family/Significant othe contact made:  Yes CM called girlfriend with pt Patient understands diagnosis:  Yes As evidenced by talking in group about his struggles with alcohol and inability to control drinking  Discussing patient identified problems/goals with staff:  Yes See below Medical problems stabilized or resolved:  Yes Denies suicidal/homicidal ideation: Yes Issues/concerns per patient self-inventory:  Yes C/O pain   Other:  New problem(s) identified: N/A  Reason for Continuation of Hospitalization: Depression Medication stabilization Withdrawal symptoms  Interventions implemented related to continuation of hospitalization: Librium detox protocol  Encourage group attendance and participation Additional comments:PA to follow up with pt about pain  Estimated length of stay:1-2 days  Discharge Plan:Return home  Follow up outpt  New goal(s): N/A  Review of initial/current patient goals per problem list:   1.  Goal(s):Safely detox from alcohol  Met:  No  Target date:12/12  As evidenced ON:GEXBMWUX in CIWA score from current to 0  2.  Goal (s):Identify comprehensive sobriety plan  Met:  No  Target date:12/13  As evidenced by:Pt self identification  3.  Goal(s):  Met:  No  Target date:  As evidenced by:  4.  Goal(s):  Met:  No  Target date:  As evidenced by:  Attendees: Patient:  Antonio [name] 06/04/2011 8:29AM  Family:     Physician:  Lupe Carney 06/04/2011 8:29 AM   Nursing:  Carolynn Comment  06/04/2011 8:29 AM   Case Manager:  Richelle Ito, LCSW 06/04/2011 8:29 AM   Counselor:  Ronda Fairly, LCSWA 06/04/2011 8:29 AM   Other: Shelda Jakes PA  06/04/2011 8:29 AM.  Other:     Other:     Other:       Scribe for Treatment Team:   Ida Rogue, 06/04/2011 8:29 AM

## 2011-06-04 NOTE — Progress Notes (Signed)
Patient ID: Antonio [name], male   DOB: 1971-06-11, 40 y.o.   MRN: [id number] Pt. Reports no SHI, pt. Reports "I was drinking stressed, popped some pills passed out, I won't never do that no more" Pt. Says stressed with mom having stroke and not being able to be there( [place] I'm the baby of [age] and I should been there" "I got mad that's why I did it". Staff will continue to monitor q59min for safety.

## 2011-06-04 NOTE — Progress Notes (Signed)
Pt states he slept well, appetite is good. Energy is normal. Pt states he has "no depression", and does not have any signs or symptoms of detox anymore. Pt denies SI/HI, and no c/o physical pain. According to Tx Team, pt may be d/ced Thursday or Friday. Mood: stable.

## 2011-06-04 NOTE — Progress Notes (Signed)
Antonio [name] is a 40 y.o. male [id number] 11-04-1970   Diagnosis:  Axis I: Alcohol Abuse Principal Problem:  *Alcohol abuse with uncomplicated intoxication   Subjective/Objective: Vital Signs:Blood pressure 125/78, pulse 63, temperature 98.1 F (36.7 C), temperature source Oral, resp. rate 18, height 5' 5.75" (1.67 m), weight 133 lb (60.328 kg), SpO2 100.00%. Mental Status: General Appearance [name]:  Casual Eye Contact:  Good Motor Behavior:  Normal Speech:  Normal Level of Consciousness:  Alert Mood:  Anxious Affect:  Appropriate Anxiety Level:  None Thought Process:  Coherent Thought Content:  WNL Perception:  Normal Judgment:  Fair Insight:  Absent Cognition:  Orientation time and place Sleep:  Number of Hours: 4.25     Assessment/Plan:Pt. Met with treatment team today and set goals for his recovery.  He is doing well and will likely be d/c'd tomorrow or Friday.  His detox is progressing well.  He is not interested in rehab any further outside of [facility].  He has limited insight in regards to his back pain and will follow up with the pain clinic for regular medication.  Yet, he can not provide a diagnosis, but does feel that he needs narcotic meds for support.      Lab Results:   BMET    Component Value Date/Time   NA 140 06/01/2011 2312   K 3.6 06/01/2011 2312   CL 105 06/01/2011 2312   CO2 23 06/01/2011 2312   GLUCOSE 83 06/01/2011 2312   BUN 9 06/01/2011 2312   CREATININE 0.67 06/01/2011 2312   CALCIUM 9.2 06/01/2011 2312   GFRNONAA >90 06/01/2011 2312   GFRAA >90 06/01/2011 2312    Medications:  Scheduled:     . chlordiazePOXIDE  25 mg Oral QID   Followed by  . chlordiazePOXIDE  25 mg Oral TID   Followed by  . chlordiazePOXIDE  25 mg Oral BH-qamhs   Followed by  . chlordiazePOXIDE  25 mg Oral Daily  . chlordiazePOXIDE  25 mg Oral QID   Followed by  . chlordiazePOXIDE  25 mg Oral TID   Followed by  . chlordiazePOXIDE  25 mg Oral BH-qamhs   Followed by  . chlordiazePOXIDE  25 mg Oral Daily  . multivitamins ther. w/minerals  1 tablet Oral Daily  . thiamine  100 mg Intramuscular Once  . thiamine  100 mg Oral Daily     PRN Meds acetaminophen, alum & mag hydroxide-simeth, chlordiazePOXIDE, hydrOXYzine, hydrOXYzine, hydrOXYzine, loperamide, magnesium hydroxide, ondansetron   Lloyd Huger T. Cleva [facility] 06/04/2011

## 2011-06-04 NOTE — Progress Notes (Signed)
Assumed care from [place], RN at 2300. Currently resting quietly in bed with eyes closed. Lying in right lateral position. Respirations even and unlabored. No acute distress noted. Safety has been maintained with Q3minute observation. Will continue current POC.

## 2011-06-05 NOTE — Progress Notes (Signed)
[facility] MD Progress Note  06/05/2011 2:00 PM  CLINICAL FACTORS: Alcohol Dependence & Withdrawal; Hepatitis; Chronic Pain   Current Mental Status: Pt stated that he is doing "well". His affect was mood congruent and euthymic. He denied any difficulties with detox. He is tolerating the Librium taper well. He denied any past history of self injurious behavior or suicidal ideation. He has a history of agitation. Nevertheless, he has been stable on the unit. He denied any current thoughts of self injurious behavior, suicidal ideation or homicidal ideation. He denied any auditory or visual hallucinations, paranoia or delusional thought processes. His eye contact was good and his speech is normal rate, tone and volume. He was pleasant and cooperative.  Requesting discharge in am.  Sleep:  Number of Hours: 6   Vital Signs:Blood pressure 93/68, pulse 96, temperature 97.4 F (36.3 C), temperature source Oral, resp. rate 17, height 5' 5.75" (1.67 m), weight 60.328 kg (133 lb), SpO2 100.00%.  Lab Results: No results found for this or any previous visit (from the past 48 hour(s)).  Physical Findings: COWS:  COWS Total Score: 5   PLAN OF CARE: Pt admitted for crisis stabilization and treatment. Please see orders. Medications reviewed with pt and medication education provided. Will continue q15 minute checks per unit protocol. No clinical indication for one on one level of observation at this time. Pt contracting for safety. Mental health treatment and continued sobriety will mitigate against the increased risk of harm to self and/or others. Discussed the importance of recovery with pt, as well as, tools to move forward in a healthy & safe manner. Pt agreeable with the plan. Discussed with the team.   Antonio [name] 06/05/2011, 2:00 PM

## 2011-06-05 NOTE — Progress Notes (Signed)
Recreation Therapy Group Note  Date: 06/05/2011         Time: 1415      Group Topic/Focus: The focus of this group is on emphasizing the importance of taking responsibility for one's actions.   Participation Level: Active  Participation Quality: Intrusive and Monopolizing  Affect: excited  Cognitive: Oriented   Additional Comments: Patient inappropriate, making inappropriate jokes, talking over others, laughing inappropriately. Patient had to be asked to leave group to get himself under control.  [name] 06/05/2011 3:53 PM

## 2011-06-05 NOTE — Progress Notes (Signed)
Pt states he slept well, appetite is good. Pt states his energy level is normal. Pt denies SI/HI. Pt states he plans to take medication once he gets home. According to treatment team, pt is to be D/Ced tomorrow. Pt going to groups/meals.

## 2011-06-05 NOTE — Progress Notes (Signed)
Patient ID: Antonio [name], male   DOB: 1971/04/08, 40 y.o.   MRN: [id number] Pt requested vistaril for anxiety earlier with relief.  He was cooperative and attended the [place] session this evening and said he enjoyed it.  Discharge is planned for tomorrow.  He denies SI/HI/AVH.

## 2011-06-05 NOTE — Progress Notes (Signed)
[facility] Group Notes:  (Counselor/Nursing/MHT/Case Management/Adjunct)  06/05/2011 1:35 PM  Type of Therapy:  group therapy  Participation Level:  Minimal  Participation Quality:  Attentive  Affect:  Excited  Cognitive:  Oriented  Insight:  Limited  Engagement in Group:  Limited  Engagement in Therapy:  Limited  Modes of Intervention:  Problem-solving, Support and exploration  Summary of Progress/Problems: Pt was able to share he was feeling good and ready to go home. Pt shared in group that the consequences of his addiction caused him to loose friends. Pt lacked insight.   Purcell Nails 06/05/2011, 1:35 PM

## 2011-06-06 MED ORDER — NAPROXEN 500 MG PO TABS: 500.0000 mg | ORAL_TABLET | Freq: Two times a day (BID) | ORAL | Status: DC

## 2011-06-06 NOTE — Progress Notes (Signed)
Patient resting quietly with eyes closed. Respirations even and unlabored. No distress noted.

## 2011-06-06 NOTE — Discharge Summary (Signed)
Pt attended discharge planning group and actively participated.  Pt presents with calm mood and affect.  Pt denies depression, anxiety and SI.  Pt reports feeling stable to d/c today.  Pt verbalized d/c plan to stay sober is to attend AA meetings daily.  Pt will return to own home and has transportation home.  Pt states his girlfriend will pick him up upon d/c.  Pt states he wants to go back to school.  Safety planning and suicide prevention discussed.   Pt doesn't need supply of meds or referral to mental health services.  No further d/c needs voiced.  Pt stable to d/c.    Reyes Ivan, LCSWA 06/06/2011  9:49 AM

## 2011-06-06 NOTE — Progress Notes (Signed)
[facility] Family/Significant Other Suicide Prevention Education  Suicide Prevention Education:  Contact Attempts: Pt's friend Janette home [phone] and cell [phone] has been identified by the patient as the family member/significant other with whom the patient will be residing, and identified as the person(s) who will aid the patient in the event of a mental health crisis.  With written consent from the patient, two attempts were made to provide suicide prevention education, prior to and/or following the patient's discharge.  We were unsuccessful in providing suicide prevention education.  A suicide education pamphlet was given to the patient to share with family/significant other.  Date and time of first attempt 06/06/11 the home number was a wrong number and the cell number was no longer in service.  Date and time of second attempt:  Purcell Nails 06/06/2011, 8:41 AM

## 2011-06-06 NOTE — Tx Team (Signed)
Interdisciplinary Treatment Plan Update (Adult)  Date:  06/06/2011  Time Reviewed:  9:38 AM   Progress in Treatment: Attending groups: Yes Participating in groups:  Yes Taking medication as prescribed: Yes Tolerating medication:  Yes Family/Significant othe contact made: Yes   Patient understands diagnosis:  Yes Discussing patient identified problems/goals with staff:  Yes Medical problems stabilized or resolved:  Yes Denies suicidal/homicidal ideation: Yes Issues/concerns per patient self-inventory:  None identified Other: N/A  New problem(s) identified: None Identified  Reason for Continuation of Hospitalization: Stable to d/c  Interventions implemented related to continuation of hospitalization: Stable to d/c  Additional comments: N/A  Estimated length of stay: D/C today  Discharge Plan: Pt will follow up with AA meetings  New goal(s): N/A  Review of initial/current patient goals per problem list:    1.  Goal(s): Safely detox from alcohol  Met:  Yes  Target date: by discharge  As evidenced by: CIWA score is currently a 0, pt safely detoxed from alcohol  2. Goal (s):Identify comprehensive sobriety plan  Met: Yes Target date: by discharge  As evidenced by:Pt self identification - pt identifies plan to go to AA meetings   Attendees: Patient:  Antonio [name] 06/06/2011 9:45 AM   Family:     Physician:  Lupe Carney, DO 06/06/2011 9:38 AM   Nursing: Carolynn Comment, RN 06/06/2011 9:38 AM   Case Manager:  Reyes Ivan, LCSWA 06/06/2011  9:38 AM   Other: Lynann Bologna, NP 06/06/2011  9:38 AM   Other:     Other:     Other:     Other:      Scribe for Treatment Team:   Reyes Ivan 06/06/2011 9:38 AM

## 2011-06-06 NOTE — Progress Notes (Signed)
Suicide Risk Assessment  Discharge Assessment     Demographic factors: See chart.   Current Mental Status: Pt stated that he is doing "well". His affect was mood congruent and euthymic. He denied any difficulties throughout detox. He is tolerated the Librium taper well. He denied any past history of self injurious behavior or suicidal ideation. He denied any current thoughts of self injurious behavior, suicidal ideation or homicidal ideation. He denied any auditory or visual hallucinations, paranoia or delusional thought processes. His eye contact was good and his speech is normal rate, tone and volume. He was pleasant and cooperative.   Loss Factors: mother s/p cva   Historical Factors: Denied hx self harm or suicide. SUDs.   Risk Reduction Factors: Responsible for children under 61 years of age;Employed;Living with another person, especially a relative;Positive therapeutic relationship   Meds:   . chlordiazePOXIDE  25 mg Oral BH-qamhs   Followed by  . chlordiazePOXIDE  25 mg Oral Daily  . chlordiazePOXIDE  25 mg Oral TID   Followed by  . chlordiazePOXIDE  25 mg Oral BH-qamhs   Followed by  . chlordiazePOXIDE  25 mg Oral Daily  . multivitamins ther. w/minerals  1 tablet Oral Daily  . thiamine  100 mg Intramuscular Once  . thiamine  100 mg Oral Daily  . DISCONTD: naproxen  500 mg Oral BID WC    CLINICAL FACTORS: Alcohol Dependence, withdrawal resolved; Hepatitis; Chronic Pain   COGNITIVE FEATURES THAT CONTRIBUTE TO RISK: none.   SUICIDE RISK: Patient is currently viewed as a low risk of harm to himself and others in light of his past history and risk factors. Furthermore, there have been no acute safety concerns on the unit.   PLAN OF CARE:  Pt seen and evaluated. Chart reviewed. Pt stable for and requesting discharge. Pt contracting for safety and does not currently meet [place] involuntary commitment criteria for continued hospitalization.  Continued sobriety will mitigate against  any future increased risk of harm to self and/or others.  Discussed the importance of recovery further with pt, as well as, tools to move forward in a healthy & safe manner.  Pt agreeable with the plan.  Discussed with the team.  Please see orders, follow up plans per team and full discharge summary completed by physician extender.  Pt declined IOP or rehab, discharging to home with GF and f/u with AA.  No meds upon discharge.  Antonio [name] 06/06/2011, 10:57 AM

## 2011-06-06 NOTE — Progress Notes (Signed)
[facility] Adult Inpatient Family/Significant Other Suicide Prevention Education  Suicide Prevention Education:  Education Completed; by pt Antonio [name]  has been identified by the patient as the family member/significant other with whom the patient will be residing, and identified as the person(s) who will aid the patient in the event of a mental health crisis (suicidal ideations/suicide attempt).  With written consent from the patient, the family member/significant other has been provided the following suicide prevention education, prior to the and/or following the discharge of the patient.  The suicide prevention education provided includes the following:  Suicide risk factors  Suicide prevention and interventions  National Suicide Hotline telephone number  [facility] assessment telephone number  [facility] Emergency Assistance 911  [facility] and/or Residential Mobile Crisis Unit telephone number  Request made of family/significant other to:  Remove weapons (e.g., guns, rifles, knives), all items previously/currently identified as safety concern.    Remove drugs/medications (over-the-counter, prescriptions, illicit drugs), all items previously/currently identified as a safety concern.  The family member/significant other verbalizes understanding of the suicide prevention education information provided.  The family member/significant other agrees to remove the items of safety concern listed above. I went over the information with pt, pt showed understanding and given the information to bring with hi. Pt stated he has no weapons in the home. Pt given info as two attempts have been made to contact individuals on consent form. Pt also able to contract for safety.  Purcell Nails 06/06/2011, 10:41 AM

## 2011-06-06 NOTE — Progress Notes (Signed)
Pt D/Ced this morning.

## 2011-06-09 NOTE — Progress Notes (Signed)
Patient Discharge Instructions: No f/u scheduled  Antonio [name], 06/09/2011, 5:14 PM

## 2011-07-01 ENCOUNTER — Ambulatory Visit: Payer: PRIVATE HEALTH INSURANCE | Admitting: Physical Medicine & Rehabilitation

## 2011-07-01 ENCOUNTER — Encounter: Payer: PRIVATE HEALTH INSURANCE | Attending: Physical Medicine & Rehabilitation

## 2011-07-01 DIAGNOSIS — Z9089 Acquired absence of other organs: Secondary | ICD-10-CM | POA: Insufficient documentation

## 2011-07-01 DIAGNOSIS — R209 Unspecified disturbances of skin sensation: Secondary | ICD-10-CM | POA: Insufficient documentation

## 2011-07-01 DIAGNOSIS — M7989 Other specified soft tissue disorders: Secondary | ICD-10-CM | POA: Insufficient documentation

## 2011-07-01 DIAGNOSIS — M549 Dorsalgia, unspecified: Secondary | ICD-10-CM | POA: Insufficient documentation

## 2011-07-01 DIAGNOSIS — R109 Unspecified abdominal pain: Secondary | ICD-10-CM | POA: Insufficient documentation

## 2011-07-11 NOTE — Discharge Summary (Signed)
  Antonio [name] June 16, 1971 41 y.o.  [id number]     Date of Admission: 06/02/2011 Date of Discharge:  06/06/2011 Diagnosis AXIS I Alcohol Dependence & Withdrawal; Hepatitis; Chronic Pain    AXIS II Deferred  AXIS III Past Medical History  Diagnosis Date  .       AXIS IV problems with primary support group  AXIS V 61-70 mild symptoms    HPI: Antonio [name] was admitted to [facility] from ED where he presented with severe alcohol intoxication and symptoms of alcohol dependency.  Hospital Course: The duration of Eulalio"s stay at [facility] was unremarkable.The patient was seen and evaluated by the Treatment team consisting of Psychiatrist, PAC, RN, Case Manager, and Therapist for evaluation and treatment plan with goal of stabilization upon discharge. The patient's physical and mental health problems were identified and treated appropriately.      Multiple modalities of treatment were used including medication, individual and group therapies, unit programming, AA/NA, improved nutrition, physical activity, and family sessions as needed.     The symptoms of alcohol/substance abuse withdrawal were monitored daily by serial clinical withdrawal scores. Improvement was demonstrated by declining CIWA/COWS numbers, improving vital signs, increased cognition, and improvement in mood, sleep, appetite as well as a reduction in psychosocial symptoms.       The patient was evaluated and found to be stable enough for discharge and was released to home per the initial plan of treatment.  MSE: For mental status exam please see SRA completed by MD on day of discharge. Labs: none pending Level of Care:  Long-term IP psych.  Meds on Discharge: Discharge Medication List as of 06/09/2011  3:03 PM    CONTINUE these medications which have NOT CHANGED   Details  naproxen (NAPROSYN) 500 MG tablet Take 500 mg by mouth 2 (two) times daily with a meal.  , Until Discontinued, Historical Med      STOP taking these medications     HYDROcodone-acetaminophen (NORCO) 10-325 MG per tablet        Is patient on multiple antipsychotic therapies at discharge:  No   Has Patient had three or more failed trials of antipsychotic monotherapy by history:  No  Follow-up recommendations:  Other:  AA if you would like help with maintaining your sobriety Discharge destination:  Other:  home Comments: Please keep all follow up appointments as scheduled. Rona Ravens. Avalynne Diver PAC for DR.Alyson Kuroski-MazzieMD 07/11/2011

## 2011-07-15 ENCOUNTER — Ambulatory Visit: Payer: PRIVATE HEALTH INSURANCE | Admitting: Physical Medicine & Rehabilitation

## 2011-08-12 ENCOUNTER — Encounter: Payer: Self-pay | Attending: Physical Medicine & Rehabilitation

## 2011-08-12 ENCOUNTER — Ambulatory Visit: Payer: Self-pay | Admitting: Physical Medicine & Rehabilitation

## 2011-08-12 DIAGNOSIS — R109 Unspecified abdominal pain: Secondary | ICD-10-CM | POA: Insufficient documentation

## 2011-08-12 DIAGNOSIS — M549 Dorsalgia, unspecified: Secondary | ICD-10-CM | POA: Insufficient documentation

## 2011-08-12 DIAGNOSIS — M7989 Other specified soft tissue disorders: Secondary | ICD-10-CM | POA: Insufficient documentation

## 2011-08-12 DIAGNOSIS — R209 Unspecified disturbances of skin sensation: Secondary | ICD-10-CM | POA: Insufficient documentation

## 2011-08-12 DIAGNOSIS — Z9089 Acquired absence of other organs: Secondary | ICD-10-CM | POA: Insufficient documentation

## 2011-08-15 ENCOUNTER — Telehealth: Payer: Self-pay | Admitting: Physical Medicine & Rehabilitation

## 2011-08-15 NOTE — Telephone Encounter (Signed)
Sponsor Lattie Corns needs some to call about patients d/c letter.

## 2011-08-15 NOTE — Telephone Encounter (Signed)
LM for Antonio [name] to call me back.

## 2011-08-18 NOTE — Telephone Encounter (Signed)
Pt's caregiver was very abrasive on the phone and kept telling me that what we were doing was wrong because he is sick and needs medical attention. I explained to her that is office policy that if a pt has narcs in their system that they don't report it is grounds for dismissal. She was rude and said that she was going to look into this because this was wrong. I offered to mail a list of other treating physicians in the area and she accepted. List will be mailed.

## 2012-02-23 ENCOUNTER — Inpatient Hospital Stay (HOSPITAL_COMMUNITY)
Admission: EM | Admit: 2012-02-23 | Discharge: 2012-02-26 | DRG: 917 | Disposition: A | Discharge status: Home / Self Care | Payer: MEDICAID | Attending: Internal Medicine | Admitting: Internal Medicine

## 2012-02-23 ENCOUNTER — Encounter (HOSPITAL_COMMUNITY): Payer: Self-pay | Admitting: *Deleted

## 2012-02-23 DIAGNOSIS — F10239 Alcohol dependence with withdrawal, unspecified: Secondary | ICD-10-CM | POA: Diagnosis present

## 2012-02-23 DIAGNOSIS — G9341 Metabolic encephalopathy: Secondary | ICD-10-CM | POA: Diagnosis present

## 2012-02-23 DIAGNOSIS — F3130 Bipolar disorder, current episode depressed, mild or moderate severity, unspecified: Secondary | ICD-10-CM | POA: Diagnosis present

## 2012-02-23 DIAGNOSIS — E872 Acidosis, unspecified: Secondary | ICD-10-CM | POA: Diagnosis present

## 2012-02-23 DIAGNOSIS — E860 Dehydration: Secondary | ICD-10-CM | POA: Diagnosis present

## 2012-02-23 DIAGNOSIS — F32A Depression, unspecified: Secondary | ICD-10-CM | POA: Diagnosis present

## 2012-02-23 DIAGNOSIS — F1123 Opioid dependence with withdrawal: Secondary | ICD-10-CM | POA: Diagnosis present

## 2012-02-23 DIAGNOSIS — T510X4A Toxic effect of ethanol, undetermined, initial encounter: Principal | ICD-10-CM | POA: Diagnosis present

## 2012-02-23 DIAGNOSIS — F1010 Alcohol abuse, uncomplicated: Secondary | ICD-10-CM | POA: Diagnosis present

## 2012-02-23 DIAGNOSIS — Z9081 Acquired absence of spleen: Secondary | ICD-10-CM

## 2012-02-23 DIAGNOSIS — T510X1A Toxic effect of ethanol, accidental (unintentional), initial encounter: Secondary | ICD-10-CM | POA: Diagnosis present

## 2012-02-23 DIAGNOSIS — D709 Neutropenia, unspecified: Secondary | ICD-10-CM | POA: Diagnosis present

## 2012-02-23 DIAGNOSIS — T400X1A Poisoning by opium, accidental (unintentional), initial encounter: Secondary | ICD-10-CM | POA: Diagnosis present

## 2012-02-23 DIAGNOSIS — F209 Schizophrenia, unspecified: Secondary | ICD-10-CM | POA: Diagnosis present

## 2012-02-23 DIAGNOSIS — R197 Diarrhea, unspecified: Secondary | ICD-10-CM

## 2012-02-23 DIAGNOSIS — B1920 Unspecified viral hepatitis C without hepatic coma: Secondary | ICD-10-CM | POA: Diagnosis present

## 2012-02-23 DIAGNOSIS — E870 Hyperosmolality and hypernatremia: Secondary | ICD-10-CM | POA: Diagnosis present

## 2012-02-23 DIAGNOSIS — F10939 Alcohol use, unspecified with withdrawal, unspecified: Secondary | ICD-10-CM | POA: Diagnosis present

## 2012-02-23 DIAGNOSIS — F17200 Nicotine dependence, unspecified, uncomplicated: Secondary | ICD-10-CM | POA: Diagnosis present

## 2012-02-23 DIAGNOSIS — R748 Abnormal levels of other serum enzymes: Secondary | ICD-10-CM | POA: Diagnosis present

## 2012-02-23 DIAGNOSIS — S3609XA Other injury of spleen, initial encounter: Secondary | ICD-10-CM | POA: Diagnosis present

## 2012-02-23 DIAGNOSIS — R4182 Altered mental status, unspecified: Secondary | ICD-10-CM

## 2012-02-23 DIAGNOSIS — G8929 Other chronic pain: Secondary | ICD-10-CM | POA: Diagnosis present

## 2012-02-23 DIAGNOSIS — F1020 Alcohol dependence, uncomplicated: Secondary | ICD-10-CM | POA: Diagnosis present

## 2012-02-23 DIAGNOSIS — F19939 Other psychoactive substance use, unspecified with withdrawal, unspecified: Secondary | ICD-10-CM | POA: Diagnosis present

## 2012-02-23 DIAGNOSIS — D696 Thrombocytopenia, unspecified: Secondary | ICD-10-CM | POA: Diagnosis present

## 2012-02-23 DIAGNOSIS — B1910 Unspecified viral hepatitis B without hepatic coma: Secondary | ICD-10-CM | POA: Diagnosis present

## 2012-02-23 DIAGNOSIS — M549 Dorsalgia, unspecified: Secondary | ICD-10-CM | POA: Diagnosis present

## 2012-02-23 DIAGNOSIS — Z79899 Other long term (current) drug therapy: Secondary | ICD-10-CM

## 2012-02-23 DIAGNOSIS — F329 Major depressive disorder, single episode, unspecified: Secondary | ICD-10-CM | POA: Diagnosis present

## 2012-02-23 DIAGNOSIS — F10929 Alcohol use, unspecified with intoxication, unspecified: Secondary | ICD-10-CM

## 2012-02-23 DIAGNOSIS — F1120 Opioid dependence, uncomplicated: Secondary | ICD-10-CM | POA: Diagnosis present

## 2012-02-23 DIAGNOSIS — F1910 Other psychoactive substance abuse, uncomplicated: Secondary | ICD-10-CM | POA: Diagnosis present

## 2012-02-23 DIAGNOSIS — F1193 Opioid use, unspecified with withdrawal: Secondary | ICD-10-CM | POA: Diagnosis present

## 2012-02-23 MED ORDER — LORAZEPAM 2 MG/ML IJ SOLN
INTRAMUSCULAR | Status: AC
  Administered 2012-02-23: 6 mg via INTRAVENOUS
  Filled 2012-02-23: qty 3

## 2012-02-23 NOTE — ED Notes (Signed)
The pt has taken a drug  Overdose according to the  pts sponsor.  He has taken overdoses in the past.  He responds  tp ammonia.

## 2012-02-24 ENCOUNTER — Inpatient Hospital Stay (HOSPITAL_COMMUNITY): Payer: Self-pay

## 2012-02-24 DIAGNOSIS — D709 Neutropenia, unspecified: Secondary | ICD-10-CM | POA: Diagnosis present

## 2012-02-24 DIAGNOSIS — F1010 Alcohol abuse, uncomplicated: Secondary | ICD-10-CM

## 2012-02-24 DIAGNOSIS — Z9081 Acquired absence of spleen: Secondary | ICD-10-CM

## 2012-02-24 DIAGNOSIS — F329 Major depressive disorder, single episode, unspecified: Secondary | ICD-10-CM | POA: Diagnosis present

## 2012-02-24 DIAGNOSIS — F1910 Other psychoactive substance abuse, uncomplicated: Secondary | ICD-10-CM

## 2012-02-24 DIAGNOSIS — B1910 Unspecified viral hepatitis B without hepatic coma: Secondary | ICD-10-CM | POA: Diagnosis present

## 2012-02-24 DIAGNOSIS — R748 Abnormal levels of other serum enzymes: Secondary | ICD-10-CM | POA: Diagnosis present

## 2012-02-24 DIAGNOSIS — R4182 Altered mental status, unspecified: Secondary | ICD-10-CM

## 2012-02-24 DIAGNOSIS — G9341 Metabolic encephalopathy: Secondary | ICD-10-CM | POA: Diagnosis present

## 2012-02-24 DIAGNOSIS — R197 Diarrhea, unspecified: Secondary | ICD-10-CM | POA: Diagnosis present

## 2012-02-24 DIAGNOSIS — G8929 Other chronic pain: Secondary | ICD-10-CM | POA: Diagnosis present

## 2012-02-24 DIAGNOSIS — E870 Hyperosmolality and hypernatremia: Secondary | ICD-10-CM | POA: Diagnosis present

## 2012-02-24 DIAGNOSIS — F1193 Opioid use, unspecified with withdrawal: Secondary | ICD-10-CM | POA: Diagnosis present

## 2012-02-24 DIAGNOSIS — F1123 Opioid dependence with withdrawal: Secondary | ICD-10-CM | POA: Diagnosis present

## 2012-02-24 DIAGNOSIS — F10939 Alcohol use, unspecified with withdrawal, unspecified: Secondary | ICD-10-CM | POA: Diagnosis present

## 2012-02-24 DIAGNOSIS — M549 Dorsalgia, unspecified: Secondary | ICD-10-CM

## 2012-02-24 DIAGNOSIS — F10239 Alcohol dependence with withdrawal, unspecified: Secondary | ICD-10-CM | POA: Diagnosis present

## 2012-02-24 DIAGNOSIS — D696 Thrombocytopenia, unspecified: Secondary | ICD-10-CM | POA: Diagnosis present

## 2012-02-24 DIAGNOSIS — B1920 Unspecified viral hepatitis C without hepatic coma: Secondary | ICD-10-CM | POA: Diagnosis present

## 2012-02-24 LAB — COMPREHENSIVE METABOLIC PANEL
ALT: 93 U/L — ABNORMAL HIGH (ref 0–53)
Calcium: 8.8 mg/dL (ref 8.4–10.5)
GFR calc Af Amer: >90 mL/min (ref >90)
Glucose, Bld: 87 mg/dL (ref 70–99)
Sodium: 150 mEq/L — ABNORMAL HIGH (ref 135–145)
Total Protein: 8.5 g/dL — ABNORMAL HIGH (ref 6.0–8.3)

## 2012-02-24 LAB — RAPID URINE DRUG SCREEN, HOSP PERFORMED
Amphetamines: NOT DETECTED
Barbiturates: NOT DETECTED
Opiates: POSITIVE — AB
Tetrahydrocannabinol: NOT DETECTED

## 2012-02-24 LAB — URINALYSIS, ROUTINE W REFLEX MICROSCOPIC
Glucose, UA: NEGATIVE mg/dL
Hgb urine dipstick: NEGATIVE
Leukocytes, UA: NEGATIVE
Specific Gravity, Urine: 1.007 (ref 1.005–1.030)
pH: 6.5 (ref 5.0–8.0)

## 2012-02-24 LAB — CBC WITH DIFFERENTIAL/PLATELET
Basophils Relative: 0 % (ref 0–1)
HCT: 42.8 % (ref 39.0–52.0)
Hemoglobin: 14.8 g/dL (ref 13.0–17.0)
Lymphocytes Relative: 47 % — ABNORMAL HIGH (ref 12–46)
Lymphs Abs: 4.7 10*3/uL — ABNORMAL HIGH (ref 0.7–4.0)
MCHC: 34.6 g/dL (ref 30.0–36.0)
Monocytes Absolute: 1.5 10*3/uL — ABNORMAL HIGH (ref 0.1–1.0)
Monocytes Relative: 15 % — ABNORMAL HIGH (ref 3–12)
Neutro Abs: 3.7 10*3/uL (ref 1.7–7.7)
RBC: 4.62 MIL/uL (ref 4.22–5.81)

## 2012-02-24 LAB — HIV ANTIBODY (ROUTINE TESTING W REFLEX): HIV: NONREACTIVE

## 2012-02-24 LAB — BASIC METABOLIC PANEL
CO2: 23 mEq/L (ref 19–32)
Calcium: 8.8 mg/dL (ref 8.4–10.5)
Chloride: 107 mEq/L (ref 96–112)
Glucose, Bld: 113 mg/dL — ABNORMAL HIGH (ref 70–99)
Sodium: 143 mEq/L (ref 135–145)

## 2012-02-24 LAB — MRSA PCR SCREENING: MRSA by PCR: NEGATIVE

## 2012-02-24 MED ORDER — SODIUM CHLORIDE 0.9 % IJ SOLN
INTRAMUSCULAR | Status: AC
  Administered 2012-02-24: 10 mL
  Filled 2012-02-24: qty 40

## 2012-02-24 MED ORDER — ONDANSETRON HCL 4 MG PO TABS: 4.0000 mg | ORAL_TABLET | Freq: Four times a day (QID) | ORAL | Status: DC | PRN

## 2012-02-24 MED ORDER — LORAZEPAM 1 MG PO TABS: 1.0000 mg | ORAL_TABLET | Freq: Four times a day (QID) | ORAL | Status: DC | PRN

## 2012-02-24 MED ORDER — TRAMADOL HCL 50 MG PO TABS
100.0000 mg | ORAL_TABLET | Freq: Four times a day (QID) | ORAL | Status: DC | PRN
  Administered 2012-02-24 – 2012-02-26 (×4): 100 mg via ORAL
  Filled 2012-02-24 (×5): qty 2

## 2012-02-24 MED ORDER — ONDANSETRON HCL 4 MG/2ML IJ SOLN: 4.0000 mg | Freq: Four times a day (QID) | INTRAMUSCULAR | Status: DC | PRN

## 2012-02-24 MED ORDER — SODIUM CHLORIDE 0.9 % IJ SOLN
INTRAMUSCULAR | Status: AC
  Administered 2012-02-24: 10 mL
  Filled 2012-02-24: qty 10

## 2012-02-24 MED ORDER — SODIUM CHLORIDE 0.9 % IV SOLN
INTRAVENOUS | Status: DC
  Administered 2012-02-24 – 2012-02-26 (×4): via INTRAVENOUS

## 2012-02-24 MED ORDER — ENOXAPARIN SODIUM 40 MG/0.4ML ~~LOC~~ SOLN
40.0000 mg | SUBCUTANEOUS | Status: DC
Start: 2012-02-24 — End: 2012-02-24
  Filled 2012-02-24: qty 0.4

## 2012-02-24 MED ORDER — LORAZEPAM 2 MG/ML IJ SOLN
0.0000 mg | Freq: Four times a day (QID) | INTRAMUSCULAR | Status: AC
  Administered 2012-02-24: 2 mg via INTRAVENOUS
  Administered 2012-02-24 – 2012-02-25 (×5): 1 mg via INTRAVENOUS
  Administered 2012-02-25: 2 mg via INTRAVENOUS
  Filled 2012-02-24 (×2): qty 1
  Filled 2012-02-24: qty 2
  Filled 2012-02-24: qty 1

## 2012-02-24 MED ORDER — THIAMINE HCL 100 MG/ML IJ SOLN
Freq: Once | INTRAVENOUS | Status: AC
  Administered 2012-02-24: 06:00:00 via INTRAVENOUS
  Filled 2012-02-24: qty 1000

## 2012-02-24 MED ORDER — LORAZEPAM 2 MG/ML IJ SOLN
0.0000 mg | Freq: Two times a day (BID) | INTRAMUSCULAR | Status: DC
  Administered 2012-02-26: 2 mg via INTRAVENOUS

## 2012-02-24 MED ORDER — FOLIC ACID 1 MG PO TABS
1.0000 mg | ORAL_TABLET | Freq: Every day | ORAL | Status: DC
  Administered 2012-02-24 – 2012-02-26 (×3): 1 mg via ORAL
  Filled 2012-02-24 (×3): qty 1

## 2012-02-24 MED ORDER — PNEUMOCOCCAL VAC POLYVALENT 25 MCG/0.5ML IJ INJ
0.5000 mL | INJECTION | INTRAMUSCULAR | Status: AC
  Administered 2012-02-25: 0.5 mL via INTRAMUSCULAR
  Filled 2012-02-24: qty 0.5

## 2012-02-24 MED ORDER — ADULT MULTIVITAMIN W/MINERALS CH
1.0000 | ORAL_TABLET | Freq: Every day | ORAL | Status: DC
  Administered 2012-02-24 – 2012-02-26 (×3): 1 via ORAL
  Filled 2012-02-24 (×3): qty 1

## 2012-02-24 MED ORDER — SODIUM CHLORIDE 0.9 % IJ SOLN
3.0000 mL | Freq: Two times a day (BID) | INTRAMUSCULAR | Status: DC
  Administered 2012-02-24 – 2012-02-26 (×4): 3 mL via INTRAVENOUS

## 2012-02-24 MED ORDER — SODIUM CHLORIDE 0.9 % IV SOLN: INTRAVENOUS | Status: DC

## 2012-02-24 MED ORDER — LORAZEPAM 2 MG/ML IJ SOLN
1.0000 mg | Freq: Four times a day (QID) | INTRAMUSCULAR | Status: DC | PRN
  Administered 2012-02-24 – 2012-02-26 (×2): 1 mg via INTRAVENOUS
  Filled 2012-02-24 (×6): qty 1

## 2012-02-24 MED ORDER — SODIUM CHLORIDE 0.9 % IV SOLN
INTRAVENOUS | Status: DC
  Administered 2012-02-24: via INTRAVENOUS

## 2012-02-24 MED ORDER — LORAZEPAM 2 MG/ML IJ SOLN
6.0000 mg | Freq: Once | INTRAMUSCULAR | Status: AC
  Administered 2012-02-23: 6 mg via INTRAVENOUS

## 2012-02-24 MED ORDER — VITAMIN B-1 100 MG PO TABS
100.0000 mg | ORAL_TABLET | Freq: Every day | ORAL | Status: DC
  Administered 2012-02-24 – 2012-02-26 (×3): 100 mg via ORAL
  Filled 2012-02-24 (×3): qty 1

## 2012-02-24 NOTE — Progress Notes (Signed)
[facility] Progress Note [place] TEAM 1 - Stepdown/ICU TEAM   Antonio [name] GEX:[id number] DOB: Jun 22, 1971 DOA: 02/23/2012 PCP: No primary provider on file.  Brief narrative: 41 year old Latino male patient who speaks some English but primarily [place]. Has reported history of prior diagnosis of hepatitis B and hepatitis C. Also has a long-standing history of polysubstance abuse primarily involving alcohol and narcotics. He apparently has a history of chronic back pain as well. Was brought to the emergency department because of altered mental status secondary to intoxicated state. This was associated with agitation as well. In the emergency department he was found to have an elevated alcohol level of 339 as well as an elevated CPK. Urine drug screen was positive for opioids. He also had a mild lactic acidosis and elevated sodium of 150 consistent with dehydration. Tylenol level was normal. At time of admission patient was unable to answer questions appropriately. His caretaker was with him and reports the patient continues for only one day. He was subsequently admitted to the step down unit for further monitoring and treatment.  Assessment/Plan: Principal Problem:  *Metabolic encephalopathy 2/2 alcohol and opiods/dehydration *Has improved with withholding of alcohol and opioids as well as rehydration *Last imaging of liver showed no evidence of cirrhosis only mild fatty liver infiltration and total bilirubin is normal so doubt at this point patient has hepatic encephalopathy  Active Problems:  Polysubstance abuse:  a) Withdrawal from opioids  b) Alcohol withdrawal/ Alcohol abuse, daily use *Continue CIWA protocol *Give nonnarcotic pain medications *Suspect patient withdrawing from both opioids and alcohol at this point   Dehydration with hypernatremia/elevated CPK *Patient admits to poor dietary intake. He relates this to his chronic hepatitis problems *Suspect patient has  primary dehydration due to preference of alcohol and narcotics over food which is also being influenced by patient's severe depression *Sodium has decreased to 143 after appropriate hydration *Continue IV fluids, follow electrolyte panel and repeat CPK in the morning *Urine remains dark   Depression **Briefly had short behavioral health admission December 2012. Apparently during that admission patient had been started on Suboxone but currently not taking any kind of medications to treat alcohol or depression *Patient's caretaker/roommate states patient has expressed suicidal ideation recently therefore will need psychiatric evaluation during this admission. Currently not expressing suicidal ideation *Patient told social worker this morning that he is depressed because when he was told he had hepatitis he felt this med he was going to die   Diarrhea *Likely noninfectious but may need to obtain stool cultures *Suspect secondary to withdrawal process   Chronic back pain *Patient relates has diffuse back pain over the entire back *Has had CT imaging in 2010 and again in 2011 showing no bony or disc disease *Plain films show no acute bony problems as well *Begin Ultram when necessary for pain control   History of splenectomy *We'll need to make sure patient has had appropriate immunizations in the past 12 months   Thrombocytopenia *Has been progressive over the past 12 months and suspect related to ongoing alcohol abuse *We'll repeat labs in the morning will also check PT INR *Check abdominal ultrasound to determine if any cirrhosis   Neutropenia- low relative neutrophil count *Concerning this may be reflective of positive HIV status therefore we'll check HIV serology   Hepatitis B/Hepatitis C *Apparently was told had this diagnosis at some point in the past 2-3 years *We'll check hepatitis panel to determine if any of these markers are positive  Knowledge deficit *Last  the nursing  staff to provide patient education materials in Spanish regarding hepatitis, alcoholism, narcotic abuse and other pertinent topics for this patient   DVT prophylaxis: Lovenox discontinued in favor of SCDs secondary to thrombocytopenia Code Status: Full Family Communication: With the permission of the patient spoke with his caregiver who was at the bedside Disposition Plan: Remain in step down due to high-risk of serious withdrawal side effects  Consultants: Psychiatry  Procedures: None  Antibiotics: None  HPI/Subjective: Patient awakened. He was able to communicate with [place] in [place]. Complains of diffuse back pain without any radiculopathy symptoms verbalized. History is noted above confirmed. Patient also had a period of homelessness while living in [place] about 2-3 years ago. He is originally from [place] Apparently no family in [place]. Denies chest pain or shortness of breath.   Objective: Blood pressure 112/78, pulse 65, temperature 98.5 F (36.9 C), temperature source Oral, resp. rate 15, height 5\' 3"  (1.6 m), weight 60.5 kg (133 lb 6.1 oz), SpO2 100.00%.  Intake/Output Summary (Last 24 hours) at 02/24/12 1246 Last data filed at 02/24/12 1000  Gross per 24 hour  Intake    900 ml  Output    350 ml  Net    550 ml     Exam: General: No acute respiratory distress, appears older than stated age Lungs: Clear to auscultation bilaterally without wheezes or crackles, 2 L nasal cannula oxygen Cardiovascular: Regular rate and rhythm without murmur gallop or rub normal S1 and S2, IV fluid at 125 cc per Abdomen: Nontender, nondistended, soft, bowel sounds positive, no rebound, no ascites, no appreciable mass, no obvious ascites Extremities: No significant cyanosis, clubbing, or edema bilateral lower extremities Neurological: Awake and without any focal neurological deficits appreciated, follow simple commands  Data Reviewed: Basic Metabolic Panel:  Lab 02/24/12 5621    NA 150*  K 3.5  CL 91*  CO2 20  GLUCOSE 87  BUN 5*  CREATININE 0.51  CALCIUM 8.8  MG --  PHOS --   Liver Function Tests:  Lab 02/24/12 0008  AST 152*  ALT 93*  ALKPHOS 95  BILITOT 0.5  PROT 8.5*  ALBUMIN 3.7   No results found for this basename: LIPASE:5,AMYLASE:5 in the last 168 hours No results found for this basename: AMMONIA:5 in the last 168 hours CBC:  Lab 02/24/12 0008  WBC 10.0  NEUTROABS 3.7  HGB 14.8  HCT 42.8  MCV 92.6  PLT 125*   Cardiac Enzymes:  Lab 02/24/12 0008  CKTOTAL 339*  CKMB --  CKMBINDEX --  TROPONINI --   BNP (last 3 results) No results found for this basename: PROBNP:3 in the last 8760 hours CBG: No results found for this basename: GLUCAP:5 in the last 168 hours  Recent Results (from the past 240 hour(s))  MRSA PCR SCREENING     Status: Normal   Collection Time   02/24/12  4:21 AM      Component Value Range Status Comment   MRSA by PCR NEGATIVE  NEGATIVE Final      Studies:  Recent x-ray studies have been reviewed in detail by the Attending Physician  Scheduled Meds:  Reviewed in detail by the Attending Physician   Junious Silk, ANP [facility] Office  [phone] Pager [phone]  On-Call/Text Page:      [email or web address]      password [email or web address]  If 7PM-7AM, please contact night-coverage [email or web address] Password [email or web address] 02/24/2012, 12:46 PM   LOS: 1 day   I have examined the  patient and reviewed the chart. I have modified the above note and agree with it.   Calvert Cantor, MD [phone]

## 2012-02-24 NOTE — H&P (Signed)
[facility] History and Physical  Antonio [name] ZOX:[id number] DOB: 09-17-1970    PCP:   None   Chief Complaint: presents altered, intoxicated, agitated.  HPI: Antonio [name] is an 41 y.o. male with hx of polysubstance abuse including heroine, opoids, alcohol, tobacco, with hx of [facility] admission for intoxication and detoxification, Hx of splenectomy for ruptured spleen, Hep B and C, lives with his male sponsor, brought into the ER being agitated, altered, and intoxicated.  His UDS showed opoid, but nothing else.  His alcohol level was 339, and CPK in the 300's.  He was found to have elevated AG with no acidosis, and Na of 150 with normal Cr.  He has slight elevation of his LFTs.  He was given IV ativan, and is currently well sedated.  He maintained his oral airways and remained hemodynamically stable.  His Tylenol level was OK.  He was confused for only one day.  His caretaker was not able to give any further information.  Hospitalist was asked to admit him for polysubstance overdose.  Rewiew of Systems: Unable.    Past Medical History  Diagnosis Date  . Hepatitis     hep b and c  . Ruptured spleen   . Pain in thoracic spine   . Chronic pain syndrome   . Depression     Past Surgical History  Procedure Date  . No past surgeries     spleen removed 5 yrs ago  . Spllenectomy     Medications:  HOME MEDS: Prior to Admission medications   Medication Sig Start Date End Date Taking? Authorizing Provider  HYDROcodone-acetaminophen (NORCO) 10-325 MG per tablet Take 1 tablet by mouth as needed.    Historical Provider, MD  nabumetone (RELAFEN) 500 MG tablet Take 500 mg by mouth 2 (two) times daily.    Historical Provider, MD     Allergies:  Allergies  Allergen Reactions  . Sulfa Antibiotics     Social History:   reports that he has been smoking Cigarettes.  He has a 15 pack-year smoking history. He does not have any smokeless tobacco history on file. He reports that  he drinks about 50.4 ounces of alcohol per week. He reports that he uses illicit drugs (Hydrocodone) about 28 times per week.  Family History: No family history on file.   Physical Exam: Filed Vitals:   02/24/12 0145 02/24/12 0200 02/24/12 0215 02/24/12 0230  BP: 115/76 114/84 132/106 143/87  Pulse: 61 56 57 50  Temp:      TempSrc:      Resp: 16 15 15    SpO2: 100% 100% 100% 100%   Blood pressure 143/87, pulse 50, temperature 97.9 F (36.6 C), temperature source Oral, resp. rate 15, SpO2 100.00%.  GEN:  Pinpoint pupils, not responsive.  Moves all four extremities with noxious stimuli. HEENT: Mucous membranes pink and anicteric; no cervical lymphadenopathy nor thyromegaly or carotid bruit; no JVD; There were no stridor. Neck is very supple. Breasts:: Not examined CHEST WALL: No tenderness CHEST: Normal respiration, clear to auscultation bilaterally.  HEART: Regular rate and rhythm.  There are no murmur, rub, or gallops.   BACK: No kyphosis or scoliosis; no CVA tenderness ABDOMEN: soft and non-tender; no masses, no organomegaly, normal abdominal bowel sounds; no pannus; no intertriginous candida. There is no rebound and no distention. Rectal Exam: Not done EXTREMITIES: No bone or joint deformity; age-appropriate arthropathy of the hands and knees; no edema; no ulcerations.  There is no calf tenderness. Genitalia: not examined  PULSES: 2+ and symmetric SKIN: Normal hydration no rash or ulceration CNS:  Facial symmetry with grimace.  Moves all extremities. Labs on Admission:  Basic Metabolic Panel:  Lab 02/24/12 9604  NA 150*  K 3.5  CL 91*  CO2 20  GLUCOSE 87  BUN 5*  CREATININE 0.51  CALCIUM 8.8  MG --  PHOS --   Liver Function Tests:  Lab 02/24/12 0008  AST 152*  ALT 93*  ALKPHOS 95  BILITOT 0.5  PROT 8.5*  ALBUMIN 3.7   No results found for this basename: LIPASE:5,AMYLASE:5 in the last 168 hours No results found for this basename: AMMONIA:5 in the last 168  hours CBC:  Lab 02/24/12 0008  WBC 10.0  NEUTROABS 3.7  HGB 14.8  HCT 42.8  MCV 92.6  PLT 125*   Cardiac Enzymes:  Lab 02/24/12 0008  CKTOTAL 339*  CKMB --  CKMBINDEX --  TROPONINI --    CBG: No results found for this basename: GLUCAP:5 in the last 168 hours   Radiological Exams on Admission: No results found.  EKG: Independently reviewed. ST with no QTc prolongation and no ischemic changes.   Assessment/Plan Present on Admission:  .Polysubstance abuse .Chronic back pain AMS  PLAN:  Will admit him to SDU for closer monitoring.  Will give supportive care.  He is at risks for ETOH withdrawal so will put him on CIWA protocol.  He was previously on Suboxone to remain drug free, but recently has been off of it.  He is stable hemodynamically at this time.  He would benefit getting psychiatry consult when he is better mentally.  He is a full code and will be admitted to [facility] service.  Other plans as per orders.  Code Status: FULL Unk Lightning, MD. [facility] Pager [phone] 7pm to 7am.  02/24/2012, 2:41 AM

## 2012-02-24 NOTE — ED Provider Notes (Addendum)
History     CSN: [id number]  Arrival date & time 02/23/12  2336   First MD Initiated Contact with Patient 02/23/12 2348      Chief Complaint  Patient presents with  . Drug Overdose    (Consider location/radiation/quality/duration/timing/severity/associated sxs/prior treatment) Patient is a 41 y.o. male presenting with Overdose. The history is provided by a caregiver. The history is limited by the condition of the patient and a language barrier. No language interpreter was used.  Drug Overdose   41 year old, Hispanic male, was brought in by his American sponsor for evaluation of altered mental status and possible drug overdose.  She said he has been having abnormal behavior since yesterday.  No other history is available.  The patient is combative, with an altered mental status.  Therefore, will 5 caveat applies  Past Medical History  Diagnosis Date  . Hepatitis     hep b and c  . Ruptured spleen   . Pain in thoracic spine   . Chronic pain syndrome   . Depression     Past Surgical History  Procedure Date  . No past surgeries     spleen removed 5 yrs ago  . Spllenectomy     No family history on file.  History  Substance Use Topics  . Smoking status: Current Everyday Smoker -- 1.0 packs/day for 15 years    Types: Cigarettes  . Smokeless tobacco: Not on file  . Alcohol Use: 50.4 oz/week    84 Cans of beer per week      Review of Systems  Unable to perform ROS   Allergies  Sulfa antibiotics  Home Medications   Current Outpatient Rx  Name Route Sig Dispense Refill  . HYDROCODONE-ACETAMINOPHEN 10-325 MG PO TABS Oral Take 1 tablet by mouth as needed.    Marland Kitchen NABUMETONE 500 MG PO TABS Oral Take 500 mg by mouth 2 (two) times daily.      BP 116/76  Pulse 73  Temp 97.9 F (36.6 C) (Oral)  Resp 20  SpO2 97%  Physical Exam  Nursing note and vitals reviewed. Constitutional: He appears well-developed and well-nourished. No distress.  HENT:  Head: Normocephalic  and atraumatic.  Eyes: Conjunctivae and EOM are normal. Pupils are equal, round, and reactive to light.  Neck: Normal range of motion. Neck supple.  Cardiovascular: Normal rate and intact distal pulses.   No murmur heard. Pulmonary/Chest: Effort normal and breath sounds normal. He has no rales.  Abdominal: Soft. Bowel sounds are normal. He exhibits no distension. There is no tenderness.  Musculoskeletal: Normal range of motion. He exhibits no edema and no tenderness.  Neurological:       Alert combative uncooperative, moving all extremities  Skin: Skin is warm and dry.  Psychiatric:       Combative and uncooperative    ED Course  Procedures (including critical care time) 41 year old, male, brought to the emergency department with altered mental status.  He may have had a drug overdose.  There is both a language barrier and his altered mental status.  That prevents, obtaining a detailed history.  We will sedate him for his own safety and the safety of the staff and evaluate him for drug overdose  Labs Reviewed  CBC WITH DIFFERENTIAL - Abnormal; Notable for the following:    Platelets 125 (*)     Neutrophils Relative 37 (*)     Lymphocytes Relative 47 (*)     Lymphs Abs 4.7 (*)  Monocytes Relative 15 (*)     Monocytes Absolute 1.5 (*)     All other components within normal limits  URINALYSIS, ROUTINE W REFLEX MICROSCOPIC - Abnormal; Notable for the following:    Color, Urine STRAW (*)     All other components within normal limits  COMPREHENSIVE METABOLIC PANEL  LACTIC ACID, PLASMA  ACETAMINOPHEN LEVEL  CK  URINE RAPID DRUG SCREEN (HOSP PERFORMED)  ETHANOL   No results found.   No diagnosis found.  ECG Sinus tachycardia at 103 beats per minute. Normal axis. Normal intervals. Normal.  ST and T waves  Spoke with Dr. Conley Rolls.  He will admit for further eval and tx.   MDM  AMS possible drug overdose Hypernatremia Incr. CPK Alcohol intoxication        Cheri Guppy, MD 02/24/12 1610  Cheri Guppy, MD 02/24/12 9604  Cheri Guppy, MD 02/24/12 [phone]

## 2012-02-24 NOTE — Progress Notes (Signed)
Utilization review completed.  

## 2012-02-24 NOTE — ED Notes (Signed)
The patient is a Spanish speaking visitor from [place] who speaks broken [place].  Today, he present with symptoms consistent with a drug overdose.  His local sponsor stated that he has been taken to the ER in the past at least three times for drug overdoses.  The patient presents in a confused and combative state.  Once he received Ativan, he advised that he "a whole lot of Vicodin" with alcohol.

## 2012-02-24 NOTE — ED Notes (Signed)
Antonio [name], EMT to sit with patient while restraints are in place.

## 2012-02-24 NOTE — ED Notes (Signed)
MD at bedside. 

## 2012-02-24 NOTE — Progress Notes (Signed)
Clinical Social Work Department BRIEF PSYCHOSOCIAL ASSESSMENT 02/24/2012  Patient:  Antonio [name], Antonio [name]     Account Number:  [id number]     Admit date:  02/23/2012  Clinical Social Worker:  Dennison Bulla  Date/Time:  02/24/2012 10:45 AM  Referred by:  RN  Date Referred:  02/24/2012 Referred for  Substance Abuse   Other Referral:   Interview type:  Patient Other interview type:   Friend present    PSYCHOSOCIAL DATA Living Status:  FRIEND(S) Admitted from facility:   Level of care:   Primary support name:  Antonio [name] Primary support relationship to patient:  FRIEND Degree of support available:   Strong    CURRENT CONCERNS Current Concerns  Substance Abuse   Other Concerns:    SOCIAL WORK ASSESSMENT / PLAN CSW received referral from RN to assist with substance abuse, disability and Medicaid. CSW reviewed chart and met with patient at bedside. Friend was present. Patient agreeable to friend being involved in assessment.    CSW introduced myself and explained role. Patient reported he was admitted after drinking and taking drugs. Patient reported it was prescription drugs but that they were not prescribed to him. Patient reports that he drinks on a daily basis about 12 beers a day. Patient admits to using prescription drugs about every other day. Patient was inconsistent about amounts or which drugs he was using. Patient has been drinking heavily for about the past 5 years. CSW and patient discussed triggers to substance use.    Patient is from [place] His family including mother, father and siblings still live there. Patient was homeless in [facility] for about 2-3 years before being "sponsored" by friend Antonio [name]) in [place]. Patient has been in [place] for about 4-5 years. Patient reports he became depressed after being diagnosed with Hep B and C. Patient reports he felt this meant he was going to die.    Patient reports no current SI or HI. Patient does report that about 1 year ago he felt  suicidal and attempted suicide. When CSW questioned patient about how he attempted, patient was unable to remember. Friend recalls no incidents of attempted suicide. Patient reports hopelessness and depressed related to medical conditions but does not have any SI or plans at this time. Patient admits to auditory hallucinations but no visual hallucinations. Patient reports the voices are depressed and tell him to drink or use substances. Patient struggles with distinguishing between hallucinations and reality. Friend reports that patient was diagnosed with depression, bipolar and schizophrenia. Friend reports that patient is always anxious and never sleeps. Patient reports that all day he watches tv and feels that the voices could be related to tv. Patient has a poor appetite but reports this is baseline for him. Patient reports feeling down most days but relates this to missing his family.    Patient has been to detox at [facility] and [facility] but was unsure of dates. Patient has never been to counseling or long term treatment for substance abuse. Patient is refusing all treatment options at this time. CSW and patient discussed the importance of treatment if he wants to eliminate substance use. Friend was encouraging patient as well to consider treatment. CSW agreed to allow friend and patient to discuss options and CSW agreed to follow up tomorrow.   Assessment/plan status:  Psychosocial Support/Ongoing Assessment of Needs Other assessment/ plan:   SBIRT   Information/referral to community resources:   CSW encouraged patient and friend to continue working with Optometrist and Department  of Social Services for disability and Medicaid. Patient has already applied for both services and working with facilities.    PATIENT'S/FAMILY'S RESPONSE TO PLAN OF CARE: Patient was alert and oriented. Patient was minimally engaged throughout the assessment. Patient avoided eye contact and had to  be encouraged to elaborate on answers. Patient very dependent on friend and agreeable to discuss options with friend. Patient agreeable to CSW follow up tomorrow.

## 2012-02-24 NOTE — Progress Notes (Signed)
[facility] Progress Note [place] TEAM 1 - Stepdown/ICU TEAM   Antonio [name] ZOX:[id number] DOB: 1970/11/28 DOA: 02/23/2012 PCP: No primary provider on file.  Brief narrative: 41 year old Latino male patient who speaks some English but primarily [place]. Has reported history of prior diagnosis of hepatitis B and hepatitis C. Also has a long-standing history of polysubstance abuse primarily involving alcohol and narcotics. He apparently has a history of chronic back pain as well. Was brought to the emergency department because of altered mental status secondary to intoxicated state. This was associated with agitation as well. In the emergency department he was found to have an elevated alcohol level of 339 as well as an elevated CPK. Urine drug screen was positive for opioids. He also had a mild lactic acidosis and elevated sodium of 150 consistent with dehydration. Tylenol level was normal. At time of admission patient was unable to answer questions appropriately. His caretaker was with him and reports the patient continues for only one day. He was subsequently admitted to the step down unit for further monitoring and treatment.  Assessment/Plan: Principal Problem:  *Metabolic encephalopathy 2/2 alcohol and opiods/dehydration *Has improved with holding alcohol and opioids as well as rehydration *Last imaging of liver showed no evidence of cirrhosis - mild fatty liver infiltration - total bilirubin is normal therefore, doubt at this point patient has hepatic encephalopathy  Active Problems:  Polysubstance abuse:  a) Withdrawal from opioids  b) Alcohol withdrawal/ Alcohol abuse, daily use *Continue CIWA protocol * non-narcotic pain medications   Dehydration with hypernatremia/elevated CPK *Patient admits to poor dietary intake. He relates this to his chronic hepatitis problems *Suspect patient has primary dehydration due to preference of alcohol and narcotics over food and anorexia  related to depression *Sodium has decreased to 143 after appropriate hydration *Continue IV fluids, follow electrolyte panel and repeat CPK in the morning *Urine remains dark   Depression **Briefly had short behavioral health admission December 2012. Apparently during that admission patient had been started on Suboxone but currently not taking any kind of medications to treat alcohol or depression *Patient's caretaker/roommate states patient has expressed suicidal ideation recently therefore will need psychiatric evaluation during this admission.  *Currently admits to depression but not expressing suicidal ideation   Diarrhea *Likely noninfectious but may need to obtain stool cultures *Possible secondary to narcotic withdrawal    Chronic back pain *Patient relates has diffuse back pain over the entire back *Has had CT imaging in 2010 and again in 2011 showing no bony or disc disease *Plain films show no acute bony problems as well *Begin Ultram when necessary for pain control   History of splenectomy *We'll need to make sure patient has had appropriate immunizations in the past 12 months   Thrombocytopenia *Has been progressive over the past 12 months and suspect related to ongoing alcohol abuse *We'll repeat labs in the morning will also check PT INR *Check abdominal ultrasound to determine if any cirrhosis   Neutropenia- low relative neutrophil count *Concerning this may be reflective of positive HIV status therefore we'll check HIV serology   Hepatitis B/Hepatitis C *Apparently was told had this diagnosis at some point in the past 2-3 years *We'll check hepatitis panel to determine if any of these markers are positive  Knowledge deficit *Will ask nursing staff to provide patient education materials in Spanish regarding hepatitis, alcoholism, narcotic abuse and other pertinent topics for this patient   DVT prophylaxis: Lovenox discontinued in favor of SCDs secondary to  thrombocytopenia Code  Status: Full Family Communication: With the permission of the patient spoke with his caregiver who was at the bedside Disposition Plan: Remain in step down due to high-risk of serious withdrawal side effects  Consultants: Psychiatry  Procedures: None  Antibiotics: None  HPI/Subjective: Patient awakened. He was able to communicate with [place] in [place]. Complains of diffuse back pain without any radiculopathy symptoms verbalized. History is noted above confirmed. Patient also had a period of homelessness while living in [place] about 2-3 years ago. He is originally from [place] Apparently no family in [place]. Denies chest pain or shortness of breath. Admits to depression and has considered suicide in the past but no intentions to to this currently.    Objective: Blood pressure 125/78, pulse 88, temperature 98.7 F (37.1 C), temperature source Oral, resp. rate 20, height 5\' 3"  (1.6 m), weight 60.5 kg (133 lb 6.1 oz), SpO2 99.00%.  Intake/Output Summary (Last 24 hours) at 02/24/12 1515 Last data filed at 02/24/12 1000  Gross per 24 hour  Intake    900 ml  Output    350 ml  Net    550 ml     Exam: General: No acute respiratory distress, sleepy but alert and oriented when awakened Lungs: Clear to auscultation bilaterally without wheezes or crackles, 2 L nasal cannula oxygen Cardiovascular: Regular rate and rhythm without murmur gallop or rub normal S1 and S2, IV fluid at 125 cc per Abdomen: Nontender, nondistended, soft, bowel sounds positive, no rebound, no ascites, no appreciable mass, no obvious ascites Extremities: No significant cyanosis, clubbing, or edema bilateral lower extremities Neurological: Awake and without any focal neurological deficits appreciated, followed commands  Data Reviewed: Basic Metabolic Panel:  Lab 02/24/12 1308 02/24/12 0008  NA 143 150*  K 3.6 3.5  CL 107 91*  CO2 23 20  GLUCOSE 113* 87  BUN 6 5*  CREATININE 0.59  0.51  CALCIUM 8.8 8.8  MG -- --  PHOS -- --   Liver Function Tests:  Lab 02/24/12 0008  AST 152*  ALT 93*  ALKPHOS 95  BILITOT 0.5  PROT 8.5*  ALBUMIN 3.7   No results found for this basename: LIPASE:5,AMYLASE:5 in the last 168 hours No results found for this basename: AMMONIA:5 in the last 168 hours CBC:  Lab 02/24/12 0008  WBC 10.0  NEUTROABS 3.7  HGB 14.8  HCT 42.8  MCV 92.6  PLT 125*   Cardiac Enzymes:  Lab 02/24/12 0008  CKTOTAL 339*  CKMB --  CKMBINDEX --  TROPONINI --   BNP (last 3 results) No results found for this basename: PROBNP:3 in the last 8760 hours CBG: No results found for this basename: GLUCAP:5 in the last 168 hours  Recent Results (from the past 240 hour(s))  MRSA PCR SCREENING     Status: Normal   Collection Time   02/24/12  4:21 AM      Component Value Range Status Comment   MRSA by PCR NEGATIVE  NEGATIVE Final      Studies:  Recent x-ray studies have been reviewed in detail by the Attending Physician  Scheduled Meds:  Reviewed in detail by the Attending Physician   Junious Silk, ANP [facility] Office  [phone] Pager [phone]  On-Call/Text Page:      [email or web address]      password [email or web address]  If 7PM-7AM, please contact night-coverage [email or web address] Password [email or web address] 02/24/2012, 3:15 PM   LOS: 1 day   I have examined the patient and reviewed the chart. I have  discussed the plan with the patient and modified the above note which I agree with.   Calvert Cantor, MD [phone]

## 2012-02-25 DIAGNOSIS — F10239 Alcohol dependence with withdrawal, unspecified: Secondary | ICD-10-CM

## 2012-02-25 LAB — BASIC METABOLIC PANEL
BUN: 10 mg/dL (ref 6–23)
CO2: 25 mEq/L (ref 19–32)
Chloride: 104 mEq/L (ref 96–112)
Creatinine, Ser: 0.57 mg/dL (ref 0.50–1.35)
GFR calc Af Amer: >90 mL/min (ref >90)
Potassium: 3.9 mEq/L (ref 3.5–5.1)

## 2012-02-25 LAB — PROTIME-INR
INR: 0.94 (ref 0.00–1.49)
Prothrombin Time: 12.8 seconds (ref 11.6–15.2)

## 2012-02-25 LAB — HEPATITIS PANEL, ACUTE
Hep A IgM: NEGATIVE
Hep B C IgM: NEGATIVE
Hepatitis B Surface Ag: NEGATIVE

## 2012-02-25 LAB — CBC
HCT: 40.9 % (ref 39.0–52.0)
Hemoglobin: 13.7 g/dL (ref 13.0–17.0)
MCHC: 33.5 g/dL (ref 30.0–36.0)
MCV: 93.2 fL (ref 78.0–100.0)
RDW: 14.1 % (ref 11.5–15.5)

## 2012-02-25 LAB — CK: Total CK: 164 U/L (ref 7–232)

## 2012-02-25 LAB — GLUCOSE, CAPILLARY: Glucose-Capillary: 84 mg/dL (ref 70–99)

## 2012-02-25 NOTE — Progress Notes (Signed)
Antonio [name] [id number] Code Status: full   Admission Data: 02/25/2012 3:03 PM Attending Provider:  Sharon Seller PCP:No primary provider on file. Consults/ Treatment Team:    [name] is a 41 y.o. male patient admitted from ED awake, alert - oriented  X 3 - no acute distress noted.  VSS - Blood pressure 146/91, pulse 74, temperature 97.6 F (36.4 C), temperature source Oral, resp. rate 22, height 5\' 3"  (1.6 m), weight 64.7 kg (142 lb 10.2 oz), SpO2 99.00%.  no c/o shortness of breath, no c/o chest pain.   IV Fluids:  IV in place, occlusive dsg intact without redness, IV cath antecubital right, condition patent and no redness normal saline.  Allergies:   Allergies  Allergen Reactions  . Sulfa Antibiotics Itching     Past Medical History  Diagnosis Date  . Hepatitis     hep b and c  . Ruptured spleen   . Pain in thoracic spine   . Chronic pain syndrome   . Depression    Medications Prior to Admission  Medication Sig Dispense Refill  . acetaminophen (TYLENOL) 500 MG tablet Take 1,000 mg by mouth every 6 (six) hours as needed. For pain       History:  obtained from chart review. Tobacco/alcohol:Polysubstance abuse and alcohol abuse  Orientation to room, and floor completed with information packet given to patient/family.  Patient declined safety video at this time.  Admission INP armband ID verified with patient/family, and in place.   SR up x 2, fall assessment complete, with patient and family able to verbalize understanding of risk associated with falls, and verbalized understanding to call nsg before up out of bed.  Call light within reach, patient able to voice, and demonstrate understanding.  Skin, clean-dry- intact without evidence of bruising, or skin tears.   No evidence of skin break down noted on exam.     Will cont to eval and treat per MD orders.  Orvan Seen, RN 02/25/2012 3:03 PM

## 2012-02-25 NOTE — Progress Notes (Signed)
Report called pt going to [phone] via w/c with belongings and care giver. Antonio [name]

## 2012-02-25 NOTE — Progress Notes (Signed)
Clinical Social Work Progress Note PSYCHIATRY SERVICE LINE 02/25/2012  Patient:  Antonio [name]  Account:  [id number]  Admit Date:  02/23/2012  Clinical Social Worker:  Ashley Jacobs, LCSW  Date/Time:  02/25/2012 01:36 PM  Review of Patient  Overall Medical Condition:   Patient medically is doing better, still remains in 3300.  Psych consult for SI and substance abuse.  Patient reports no interest in substance abuse and reports he does have some mood swings and if people look at him the wrong way, he will go after them. Does not have a specific plan nor any intention for hurt anyone, but appears to be on edge.    Regarding substance abuse, CSW Jeanice Lim has already engaged patient with regards to resources and inpatient vs outpatient.  Patient was refusing all treatment at this time, but he was agreeable to have CSW place resources on discharge paperwork in case he changes his mind.    Spoke with 'friend' with regards to support and to getting patient to appointments if he was willing to go. Friend reports she give patient daily allowance so he can have money in his pocket, thus might be how he is getting substances.  Discussed using that money for treatment instead of substances, however friend  becomes defensive and wants only resources that are free or sliding scale.    Resources will be given if patient and friend are interested.   Participation Level:  Minimal  Participation Quality  Guarded  Resistant   Other Participation Quality:   Patient and friend open to discussion, but not happy with resources and answers given.   Affect  Flat  Blunt   Cognitive  Appropriate   Reaction to Medications/Concerns:   none reported, discussed with psych MD a mood stabilizer for medication management.   Modes of Intervention  Education  Behaviors/Psychosis  Exploration   Summary of Progress/Plan at Discharge   1. Will give resources at dc for outpatient and community if patient decides  to utilize.  2. Per Psych note, will follow any other additional recommendations.   Ashley Jacobs, MSW LCSW [phone]
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
## 2012-02-25 NOTE — Progress Notes (Signed)
Clinical Social Work  CSW met with patient at bedside. No visitors were present. Patient continues to be minimally engaged. CSW spoke with patient regarding if he had thought about treatment options. CSW encouraged intensive outpatient therapy due to high SBIRT score. Patient reports that he is not interested in any treatment at this time. CSW offered to place referral on AVS for patient in case he changed his mind. CSW referred patient to [facility] [place] and explained that facility could assist with depression or substance abuse. Patient agreeable and agreed to follow up if he changed his mind. CSW is signing off but available if needed.  [place], [place] [phone]

## 2012-02-25 NOTE — Progress Notes (Signed)
[facility] Progress Note [place] TEAM 1 - Stepdown/ICU TEAM   Antonio [name] ZOX:[id number] DOB: Nov 25, 1970 DOA: 02/23/2012 PCP: No primary provider on file.  Brief narrative: 41 year old Latino male patient who speaks some English but primarily [place]. Has reported history of prior diagnosis of hepatitis B and hepatitis C. Also has a long-standing history of polysubstance abuse primarily involving alcohol and narcotics. He apparently has a history of chronic back pain as well. Was brought to the emergency department because of altered mental status secondary to intoxicated state. This was associated with agitation as well. In the emergency department he was found to have an elevated alcohol level of 339 as well as an elevated CPK. Urine drug screen was positive for opioids. He also had a mild lactic acidosis and elevated sodium of 150 consistent with dehydration. Tylenol level was normal. At time of admission patient was unable to answer questions appropriately. His caretaker was with him and reports the patient continues for only one day. He was subsequently admitted to the step down unit for further monitoring and treatment.  Assessment/Plan:  Metabolic encephalopathy 2/2 alcohol and opiods/dehydration *Has improved with withholding of alcohol and opioids as well as rehydration *Last imaging of liver showed no evidence of cirrhosis only mild fatty liver infiltration and total bilirubin is normal so doubt at this point patient has hepatic encephalopathy  Polysubstance abuse:  a) Withdrawal from opioids  b) Alcohol withdrawal/ Alcohol abuse, daily use *Continue CIWA protocol *Give nonnarcotic pain medications *Suspect patient withdrawing from both opioids and alcohol at this point  Dehydration with hypernatremia/elevated CPK *Patient admits to poor dietary intake. He relates this to his chronic hepatitis  *Suspect patient has primary dehydration due to preference of alcohol and  narcotics over food which is also being influenced by patient's severe depression *Sodium has decreased to 143 after appropriate hydration *Continue IV fluids, follow electrolyte panel and repeat CPK in the morning *Discontinue Foley catheter  Depression/? Known bipolar disorder and schizophrenia **Briefly had short behavioral health admission December 2012. Apparently during that admission patient had been started on Suboxone but currently not taking any kind of medications to treat alcohol or depression *Patient's caretaker/roommate states patient has expressed suicidal ideation recently therefore will need psychiatric evaluation during this admission. Currently not expressing suicidal ideation *Patient told social worker this morning that he is depressed because when he was told he had hepatitis he felt this meant he was going to die  Diarrhea *Resolved *Suspect secondary to withdrawal process  Chronic back pain *Patient relates has diffuse back pain over the entire back *Has had CT imaging in 2010 and again in 2011 showing no bony or disc disease *Plain films show no acute bony problems as well *Begin Ultram when necessary for pain control  History of splenectomy *We'll need to make sure patient has had appropriate immunizations in the past 12 months  Thrombocytopenia *Has been progressive over the past 12 months and suspect related to ongoing alcohol abuse *We'll repeat labs in the morning- will also check PT INR *Check abdominal ultrasound to determine if any cirrhosis  Neutropenia - low relative neutrophil count *Concerning this may be reflective of positive HIV status therefore we'll check HIV serology  Hepatitis B/Hepatitis C *Apparently was told had this diagnosis at some point in the past 2-3 years *We'll check hepatitis panel to determine if any of these markers are positive *Abdominal ultrasound shows no evidence of cirrhosis only a small contracted thickwalled  gallbladder. Patient without any current abdominal  pain or postprandial symptoms.  Knowledge deficit *asked the nursing staff to provide patient education materials in Spanish regarding hepatitis, alcoholism, narcotic abuse and other pertinent topics for this patient  DVT prophylaxis: Lovenox discontinued in favor of SCDs secondary to thrombocytopenia Code Status: Full Family Communication: With the permission of the patient spoke with his caregiver Phil Dopp who was at the bedside. Updated on abdominal ultrasound results. Discussed plans for patient to begin paperwork to obtain Medicaid. Pursuing disability caregiver and patient made aware does not have criteria based on hepatitis for disability but may meet psychiatric criteria Disposition Plan: Transfer to floor  Consultants: Psychiatry-consult pending  Procedures: None  Antibiotics: None  HPI/Subjective: Patient awake. He was able to communicate with [place] in [place]. Complains of diffuse back pain without any radiculopathy symptoms verbalized. History as noted above confirmed. Patient also had a period of homelessness while living in [place] about 2-3 years ago. He is originally from [place] Apparently no family in [place]. Denies chest pain or shortness of breath.   Objective: Blood pressure 140/90, pulse 52, temperature 98.3 F (36.8 C), temperature source Oral, resp. rate 18, height 5\' 3"  (1.6 m), weight 60.5 kg (133 lb 6.1 oz), SpO2 99.00%.  Intake/Output Summary (Last 24 hours) at 02/25/12 1342 Last data filed at 02/25/12 1249  Gross per 24 hour  Intake   2850 ml  Output   2750 ml  Net    100 ml     Exam: General: No acute respiratory distress, appears older than stated age Lungs: Clear to auscultation bilaterally without wheezes or crackles, 2 L nasal cannula oxygen Cardiovascular: Regular rate and rhythm without murmur gallop or rub normal S1 and S2,  Abdomen: Nontender, nondistended, soft, bowel  sounds positive, no rebound, no ascites, no appreciable mass, no obvious ascites Extremities: No significant cyanosis, clubbing, or edema bilateral lower extremities Neurological: Awake and without any focal neurological deficits appreciated, follow simple commands  Data Reviewed: Basic Metabolic Panel:  Lab 02/25/12 1308 02/24/12 1140 02/24/12 0008  NA 138 143 150*  K 3.9 3.6 3.5  CL 104 107 91*  CO2 25 23 20   GLUCOSE 99 113* 87  BUN 10 6 5*  CREATININE 0.57 0.59 0.51  CALCIUM 8.9 8.8 8.8  MG -- -- --  PHOS -- -- --   Liver Function Tests:  Lab 02/24/12 0008  AST 152*  ALT 93*  ALKPHOS 95  BILITOT 0.5  PROT 8.5*  ALBUMIN 3.7   CBC:  Lab 02/25/12 0500 02/24/12 0008  WBC 8.7 10.0  NEUTROABS -- 3.7  HGB 13.7 14.8  HCT 40.9 42.8  MCV 93.2 92.6  PLT 122* 125*   Cardiac Enzymes:  Lab 02/25/12 0500 02/24/12 0008  CKTOTAL 164 339*  CKMB -- --  CKMBINDEX -- --  TROPONINI -- --    Recent Results (from the past 240 hour(s))  MRSA PCR SCREENING     Status: Normal   Collection Time   02/24/12  4:21 AM      Component Value Range Status Comment   MRSA by PCR NEGATIVE  NEGATIVE Final      Studies:  Recent x-ray studies have been reviewed in detail by the Attending Physician  Scheduled Meds:  Reviewed in detail by the Attending Physician   Junious Silk, ANP [facility] Office  [phone] Pager [phone]  On-Call/Text Page:      [email or web address]      password [email or web address]  If 7PM-7AM, please contact night-coverage [email or web address] Password [facility] 02/25/2012,  1:42 PM   LOS: 2 days   I have personally examined this patient and reviewed the entire database. I have reviewed the above note, made any necessary editorial changes, and agree with its content.  Lonia Blood, MD [facility]

## 2012-02-26 DIAGNOSIS — F329 Major depressive disorder, single episode, unspecified: Secondary | ICD-10-CM

## 2012-02-26 DIAGNOSIS — D696 Thrombocytopenia, unspecified: Secondary | ICD-10-CM

## 2012-02-26 DIAGNOSIS — G9341 Metabolic encephalopathy: Secondary | ICD-10-CM

## 2012-02-26 LAB — GLUCOSE, CAPILLARY: Glucose-Capillary: 92 mg/dL (ref 70–99)

## 2012-02-26 LAB — COMPREHENSIVE METABOLIC PANEL
ALT: 72 U/L — ABNORMAL HIGH (ref 0–53)
AST: 90 U/L — ABNORMAL HIGH (ref 0–37)
Albumin: 2.9 g/dL — ABNORMAL LOW (ref 3.5–5.2)
CO2: 26 mEq/L (ref 19–32)
Calcium: 9.2 mg/dL (ref 8.4–10.5)
Creatinine, Ser: 0.57 mg/dL (ref 0.50–1.35)
GFR calc non Af Amer: >90 mL/min (ref >90)
Sodium: 136 mEq/L (ref 135–145)
Total Protein: 7.1 g/dL (ref 6.0–8.3)

## 2012-02-26 LAB — CBC
HCT: 40.8 % (ref 39.0–52.0)
Hemoglobin: 13.7 g/dL (ref 13.0–17.0)
MCH: 31.8 pg (ref 26.0–34.0)
MCHC: 33.6 g/dL (ref 30.0–36.0)
MCV: 94.7 fL (ref 78.0–100.0)
RBC: 4.31 MIL/uL (ref 4.22–5.81)

## 2012-02-26 MED ORDER — DIVALPROEX SODIUM ER 500 MG PO TB24: 500.0000 mg | ORAL_TABLET | Freq: Every day | ORAL | Status: DC

## 2012-02-26 MED ORDER — TRAMADOL HCL 50 MG PO TABS: 100.0000 mg | ORAL_TABLET | Freq: Four times a day (QID) | ORAL | Status: AC | PRN

## 2012-02-26 MED ORDER — CHLORDIAZEPOXIDE HCL 10 MG PO CAPS: 10.0000 mg | ORAL_CAPSULE | Freq: Three times a day (TID) | ORAL | Status: DC

## 2012-02-26 MED ORDER — HYDROCODONE-ACETAMINOPHEN 5-325 MG PO TABS: 0.5000 | ORAL_TABLET | Freq: Three times a day (TID) | ORAL | Status: DC | PRN

## 2012-02-26 MED ORDER — HYDROCODONE-ACETAMINOPHEN 5-325 MG PO TABS: 0.5000 | ORAL_TABLET | Freq: Three times a day (TID) | ORAL | Status: AC | PRN

## 2012-02-26 MED ORDER — LIDOCAINE 5 % EX PTCH: 1.0000 | MEDICATED_PATCH | CUTANEOUS | Status: DC

## 2012-02-26 MED ORDER — FOLIC ACID 1 MG PO TABS: 1.0000 mg | ORAL_TABLET | Freq: Every day | ORAL | Status: DC

## 2012-02-26 MED ORDER — DIVALPROEX SODIUM ER 500 MG PO TB24
500.0000 mg | ORAL_TABLET | Freq: Every day | ORAL | Status: DC
  Filled 2012-02-26: qty 1

## 2012-02-26 MED ORDER — THIAMINE HCL 100 MG PO TABS: 100.0000 mg | ORAL_TABLET | Freq: Every day | ORAL | Status: DC

## 2012-02-26 MED ORDER — CHLORDIAZEPOXIDE HCL 10 MG PO CAPS: ORAL_CAPSULE | ORAL | Status: DC

## 2012-02-26 MED ORDER — LIDOCAINE 5 % EX PTCH: 1.0000 | MEDICATED_PATCH | CUTANEOUS | Status: AC

## 2012-02-26 NOTE — Progress Notes (Signed)
Jeanine Luz discharged Home per MD order.  Discharge instructions reviewed and discussed with the patient, all questions and concerns answered. Copy of instructions and scripts given to patient.   Joash, Tony  Home Medication Instructions ZOX:[id number]   Printed on:02/26/12 1542  Medication Information                    divalproex (DEPAKOTE ER) 500 MG 24 hr tablet Take 1 tablet (500 mg total) by mouth at bedtime.           chlordiazePOXIDE (LIBRIUM) 10 MG capsule Take 10 mg three times a day for 1 day, then 10 mg twice daily for 1 day, then 10 mg daily for 1 day then discontinue. May take an extra 10 mg once daily as needed for anxiety and alcohol withdrawal.           folic acid (FOLVITE) 1 MG tablet Take 1 tablet (1 mg total) by mouth daily.           HYDROcodone-acetaminophen (NORCO/VICODIN) 5-325 MG per tablet Take 0.5-1 tablets by mouth every 8 (eight) hours as needed.           thiamine 100 MG tablet Take 1 tablet (100 mg total) by mouth daily.           traMADol (ULTRAM) 50 MG tablet Take 2 tablets (100 mg total) by mouth every 6 (six) hours as needed for pain.           lidocaine (LIDODERM) 5 % Place 1 patch onto the skin daily. To low back for back pain. Use at bedtime. Remove & Discard patch within 12 hours or as directed by MD             Patients skin is clean, dry and intact, no evidence of skin break down, except for bruise to right hip. IV site discontinued and catheter remains intact. Site without signs and symptoms of complications. Dressing and pressure applied.  Patient escorted to car by SN in a wheelchair,  no distress noted upon discharge.  Laural Benes, Siegfried Vieth C 02/26/2012 3:42 PM

## 2012-02-26 NOTE — Progress Notes (Signed)
Subjective: Patient awake and walking in the room.  Complaining of low back pain.  Caretaker by bedside.  Objective: Vital signs in last 24 hours: Filed Vitals:   02/25/12 1453 02/25/12 2049 02/26/12 0315 02/26/12 0600  BP: 146/91 133/89 128/84 139/88  Pulse: 74 82 68 54  Temp: 97.6 F (36.4 C) 98.3 F (36.8 C) 98.7 F (37.1 C) 98.5 F (36.9 C)  TempSrc: Oral Oral Oral Oral  Resp: 22 20 20 20   Height: 5\' 3"  (1.6 m)     Weight: 64.7 kg (142 lb 10.2 oz)     SpO2: 99% 99% 100% 100%   Weight change:   Intake/Output Summary (Last 24 hours) at 02/26/12 1235 Last data filed at 02/26/12 1012  Gross per 24 hour  Intake 1414.25 ml  Output    900 ml  Net 514.25 ml    Physical Exam: General: Awake, Oriented, No acute distress, very minimal English. HEENT: EOMI. Neck: Supple CV: S1 and S2 Lungs: Clear to ascultation bilaterally Abdomen: Soft, Nontender, Nondistended, +bowel sounds. Ext: Good pulses. Trace edema.  Lab Results: Basic Metabolic Panel:  Lab 02/26/12 9604 02/25/12 0500 02/24/12 1140 02/24/12 0008  NA 136 138 143 150*  K 3.8 3.9 3.6 3.5  CL 101 104 107 91*  CO2 26 25 23 20   GLUCOSE 95 99 113* 87  BUN 10 10 6  5*  CREATININE 0.57 0.57 0.59 0.51  CALCIUM 9.2 8.9 8.8 8.8  MG -- -- -- --  PHOS -- -- -- --   Liver Function Tests:  Lab 02/26/12 0529 02/24/12 0008  AST 90* 152*  ALT 72* 93*  ALKPHOS 152* 95  BILITOT 0.6 0.5  PROT 7.1 8.5*  ALBUMIN 2.9* 3.7   No results found for this basename: LIPASE:5,AMYLASE:5 in the last 168 hours No results found for this basename: AMMONIA:5 in the last 168 hours CBC:  Lab 02/26/12 0529 02/25/12 0500 02/24/12 0008  WBC 9.8 8.7 10.0  NEUTROABS -- -- 3.7  HGB 13.7 13.7 14.8  HCT 40.8 40.9 42.8  MCV 94.7 93.2 92.6  PLT 122* 122* 125*   Cardiac Enzymes:  Lab 02/25/12 0500 02/24/12 0008  CKTOTAL 164 339*  CKMB -- --  CKMBINDEX -- --  TROPONINI -- --   BNP (last 3 results) No results found for this basename:  PROBNP:3 in the last 8760 hours CBG:  Lab 02/26/12 0318 02/25/12 2052  GLUCAP 92 84   No results found for this basename: HGBA1C:5 in the last 72 hours Other Labs: No components found with this basename: POCBNP:3 No results found for this basename: DDIMER:2 in the last 168 hours No results found for this basename: CHOL:2,HDL:2,LDLCALC:2,TRIG:2,CHOLHDL:2,LDLDIRECT:2 in the last 168 hours  Lab 02/25/12 0500  TSH 1.643  T4TOTAL --  T3FREE --  FREET4 --  THYROIDAB --   No results found for this basename: VITAMINB12:2,FOLATE:2,FERRITIN:2,TIBC:2,IRON:2,RETICCTPCT:2 in the last 168 hours  Micro Results: Recent Results (from the past 240 hour(s))  MRSA PCR SCREENING     Status: Normal   Collection Time   02/24/12  4:21 AM      Component Value Range Status Comment   MRSA by PCR NEGATIVE  NEGATIVE Final     Studies/Results: US Abdomen Complete  02/24/2012  *RADIOLOGY REPORT*  Clinical Data:  History of hepatitis and splenectomy.  Question cirrhosis.  COMPLETE ABDOMINAL ULTRASOUND  Comparison:  Abdominal ultrasound 07/01/2010.  Abdominal CT 06/06/2010.  Findings:  Gallbladder: The gallbladder is contracted with diffuse mild wall thickening to 3.2 mm.  There is no evidence of gallstones or sonographic Murphy's sign.  Common bile duct:   Normal in caliber without filling defects.  Liver:  The hepatic echogenicity is diffusely increased.  No focal lesions are identified.  There is no perihepatic ascites.  IVC:  Visualized portions appear unremarkable.  Pancreas:  Visualized portions appear unremarkable.  Spleen:  Surgically absent.  Right Kidney:   The renal cortical thickness and echogenicity are preserved.  There is no hydronephrosis or focal abnormality. Renal length is 12.6 cm.  Left Kidney:   The renal cortical thickness and echogenicity are preserved.  There is no hydronephrosis or focal abnormality. Renal length is 11.0 cm.  Abdominal aorta:  Visualized portions appear unremarkable.   IMPRESSION:  1.  Diffuse gallbladder wall thickening without evidence of cholelithiasis or sonographic Murphy's sign.  As correlated with prior study, this could reflect adenomatosis or adjacent inflammation. 2.  Nonspecific increased hepatic echogenicity most consistent with steatosis.  No specific signs of cirrhosis identified by ultrasound.   Original Report Authenticated By: Gerrianne Scale, M.D.     Medications: I have reviewed the patient's current medications. Scheduled Meds:   . folic acid  1 mg Oral Daily  . LORazepam  0-4 mg Intravenous Q6H   Followed by  . LORazepam  0-4 mg Intravenous Q12H  . multivitamin with minerals  1 tablet Oral Daily  . sodium chloride  3 mL Intravenous Q12H  . thiamine  100 mg Oral Daily   Continuous Infusions:   . sodium chloride 75 mL/hr at 02/26/12 1012   PRN Meds:.LORazepam, LORazepam, ondansetron (ZOFRAN) IV, ondansetron, traMADol  Assessment/Plan: Metabolic encephalopathy 2/2 alcohol and opiods/dehydration  Resolved,  improved with withholding of alcohol and opioids as well as rehydration. Last imaging of liver showed no evidence of cirrhosis only mild fatty liver infiltration and total bilirubin is normal so doubt at this point patient has hepatic encephalopathy.  Polysubstance abuse:  Withdrawal from opioids and Alcohol withdrawal/ Alcohol abuse, daily use  Continue CIWA protocol.  Transition Ativan to Librium.  Continue Librium taper at discharge.  Dehydration with hypernatremia/elevated CPK  Resolved with IV hydration.    Depression/? Known bipolar disorder and schizophrenia  Briefly had short behavioral health admission December 2012. Apparently during that admission patient had been started on Suboxone but currently not taking any kind of medications to treat alcohol or depression.  Patient evaluated by psychiatry, had outpatient psychiatry referral.  Psychiatry also recommended starting the patient on Depakote 500 ER qhs.  Per  psychiatry stable for discharge on a sign off.  Patient given resources to establish care with outpatient psychiatry.  Diarrhea  Resolved.   Chronic back pain  Patient relates has diffuse back pain over the entire back. Has had CT imaging in 2010 and again in 2011 showing no bony or disc disease. Plain films show no acute bony problems as well on 03/2011.  Continue with tramadol. PRN vicodin, caretaker indicated that she will be supervising the patient regarding administration of pain medications.  History of splenectomy  We'll need to make sure patient has had appropriate immunizations in the past 12 months.  Thrombocytopenia  Has been progressive over the past 12 months and suspect related to ongoing alcohol abuse.  Abdominal ultrasound on 02/24/2012 showed diffuse gallbladder wall thickening without evidence of cholelithiasis, nonspecific increased hepatic echogenicity most consistent with steatosis, no specific signs of cirrhosis identified.  Neutropenia - low relative neutrophil count  HIV nonreactive.  Likely due to alcohol consumption.  Hepatitis  B/Hepatitis C  Hepatitis panel on September 30,013 was reactive for hepatitis C.  Hepatitis B IgM and surface antigen negative.  However on 08/13/2009 hepatitis B surface antibody and hepatitis B core antibody was positive.  Will need further workup as outpatient.  Abdominal ultrasound as indicated above.  Knowledge deficit  Nursing staff to provide patient education materials in Spanish regarding hepatitis, alcoholism, narcotic abuse and other pertinent topics for this patient   DVT prophylaxis: SCDs secondary to thrombocytopenia  Code Status: Full  Family Communication: With the permission of the patient spoke with his caregiver Phil Dopp who was at the bedside. Disposition Plan: Discharge the patient today.  Consultants:  Psychiatry, Dr. Ferol Luz, signed off.  Procedures:  None  Antibiotics:  None    LOS: 3 days    Antonio [name] A, MD 02/26/2012, 12:35 PM

## 2012-02-26 NOTE — Care Management Note (Signed)
    Page 1 of 1   02/26/2012     4:55:58 PM   CARE MANAGEMENT NOTE 02/26/2012  Patient:  Antonio [name], Antonio [name]   Account Number:  [id number]  Date Initiated:  02/26/2012  Documentation initiated by:  Letha Cape  Subjective/Objective Assessment:   dx Metabolic  Encephalopathy  admit- lives with care giver.     Action/Plan:   Anticipated DC Date:  02/26/2012   Anticipated DC Plan:  HOME/SELF CARE      DC Planning Services  CM consult      Choice offered to / List presented to:             Status of service:  Completed, signed off Medicare Important Message given?   (If response is "NO", the following Medicare IM given date fields will be blank) Date Medicare IM given:   Date Additional Medicare IM given:    Discharge Disposition:  HOME/SELF CARE  Per UR Regulation:  Reviewed for med. necessity/level of care/duration of stay  If discussed at Long Length of Stay Meetings, dates discussed:    Comments:  02/26/12 16:54 Letha Cape RN, BSN [phone] patient lives with care giver, pta independent, but caregiver buys patients meds , food etc.  Patient is eligible for med ast, helped with 3 day supply of Depakote. Pt dc to home with care giver.

## 2012-02-26 NOTE — Plan of Care (Signed)
Problem: Phase I Progression Outcomes Goal: Initial discharge plan identified Outcome: Completed/Met Date Met:  02/26/12 To return home with caregiver

## 2012-02-26 NOTE — Progress Notes (Signed)
Patient has been moved to 5500 for continued care. All information for outpatient and substance abuse treatment has been placed on dc paperwork along with given to patient male friend. No other needs per patient and declining other assistance.  Will sign off. If needs arise, please re-consult.  Ashley Jacobs, MSW LCSW [phone]

## 2012-02-26 NOTE — Discharge Summary (Signed)
Physician Discharge Summary  Manus Weedman AVW:[id number] DOB: 09/04/1970 DOA: 02/23/2012  PCP: No primary provider on file.  Admit date: 02/23/2012 Discharge date: 02/26/2012  Recommendations for Outpatient Follow-up:  1. Followup with primary care physician and psychiatry with resources provided by case manager and social worker. 2. Will make further workup and management for hepatitis B and C.  Discharge Diagnoses:  Principal Problem:  *Metabolic encephalopathy 2/2 alcohol and opiods/dehydration Active Problems:  Alcohol abuse, daily use  Polysubstance abuse  Chronic back pain  Withdrawal from opioids  Alcohol withdrawal  History of splenectomy  Thrombocytopenia  Neutropenia- low relative neutrophil count  Dehydration with hypernatremia  Elevated CPK  Depression  Hepatitis B  Hepatitis C  Diarrhea   Discharge Condition: Stable  Diet recommendation: Heart healthy diet  Filed Weights   02/24/12 0409 02/25/12 1453  Weight: 60.5 kg (133 lb 6.1 oz) 64.7 kg (142 lb 10.2 oz)    History of present illness:  Antonio [name] is an 41 y.o. male with hx of polysubstance abuse including heroine, opoids, alcohol, tobacco, with hx of [facility] admission for intoxication and detoxification, Hx of splenectomy for ruptured spleen, Hep B and C, lives with his male sponsor, brought into the ER being agitated, altered, and intoxicated on 02/24/2012.  Hospital Course:  Metabolic encephalopathy 2/2 alcohol and opiods/dehydration  Resolved,  improved with withholding of alcohol and opioids as well as rehydration. Last imaging of liver showed no evidence of cirrhosis only mild fatty liver infiltration and total bilirubin is normal so doubt at this point patient has hepatic encephalopathy.  Polysubstance abuse:  Withdrawal from opioids and Alcohol withdrawal/ Alcohol abuse, daily use  Initially started on CIWA protocol.  Transitioned Ativan to Librium on 02/26/2012.  Continue Librium taper at  discharge, patient was counseled on not using alcohol at discharge with Librium..  Patient counseled on cessation and behavior modification for polysubstance abuse.  Dehydration with hypernatremia/elevated CPK  Resolved with IV hydration.    Depression/? Known bipolar disorder and schizophrenia  Briefly had short behavioral health admission December 2012. Apparently during that admission patient had been started on Suboxone but currently not taking any kind of medications to treat alcohol or depression.  Patient evaluated by psychiatry Dr. Ferol Luz, recommended outpatient psychiatry referral.  Psychiatry also recommended starting the patient on Depakote 500 ER qhs.  Per psychiatry stable for discharge and signed off.  Patient given resources to establish care with outpatient psychiatry.  Diarrhea  Resolved.  Likely due to withdrawal from pain medications.  Chronic back pain  Patient relates has diffuse back pain over the entire back. Has had CT imaging in 2010 and again in 2011 showing no bony or disc disease. Plain films show no acute bony problems as well on 03/2011.  Continue with tramadol. PRN vicodin, caretaker indicated that she will be supervising the patient regarding administration of pain medications.  History of splenectomy  Will need to make sure patient has had appropriate immunizations in the past 12 months, under the discretion of primary care physician after discharge.  Thrombocytopenia  Has been progressive over the past 12 months and suspect related to ongoing alcohol abuse.  Abdominal ultrasound on 02/24/2012 showed diffuse gallbladder wall thickening without evidence of cholelithiasis, nonspecific increased hepatic echogenicity most consistent with steatosis, no specific signs of cirrhosis identified.  Neutropenia - low relative neutrophil count  HIV nonreactive.  Likely due to alcohol consumption.  Hepatitis B/Hepatitis C  Hepatitis panel on September 30,013 was reactive  for hepatitis C.  Hepatitis B IgM and surface antigen negative.  However on 08/13/2009 hepatitis B surface antibody and hepatitis B core antibody was positive.  Will need further workup as outpatient.  Abdominal ultrasound as indicated above.  Knowledge deficit  Nursing staff to provide patient education materials in Spanish regarding hepatitis, alcoholism, narcotic abuse and other pertinent topics for this patient   Procedures:  As above  Consultations:  Psychiatry, Dr. Ferol Luz  Discharge Exam: Filed Vitals:   02/26/12 1422  BP: 130/88  Pulse: 88  Temp: 94.4 F (34.7 C)  Resp: 20   Filed Vitals:   02/25/12 2049 02/26/12 0315 02/26/12 0600 02/26/12 1422  BP: 133/89 128/84 139/88 130/88  Pulse: 82 68 54 88  Temp: 98.3 F (36.8 C) 98.7 F (37.1 C) 98.5 F (36.9 C) 94.4 F (34.7 C)  TempSrc: Oral Oral Oral Oral  Resp: 20 20 20 20   Height:      Weight:      SpO2: 99% 100% 100% 100%   Discharge Instructions  Discharge Orders    Future Orders Please Complete By Expires   Diet - low sodium heart healthy      Increase activity slowly      Discharge instructions      Comments:   Followup with PCP and psychiatry with resources provided to you by case manager and Child psychotherapist. Do not drink alcohol while on Librium.     Medication List  As of 02/26/2012  2:34 PM   STOP taking these medications         acetaminophen 500 MG tablet         TAKE these medications         chlordiazePOXIDE 10 MG capsule   Commonly known as: LIBRIUM   Take 10 mg three times a day for 1 day, then 10 mg twice daily for 1 day, then 10 mg daily for 1 day then discontinue. May take an extra 10 mg once daily as needed for anxiety and alcohol withdrawal.      divalproex 500 MG 24 hr tablet   Commonly known as: DEPAKOTE ER   Take 1 tablet (500 mg total) by mouth at bedtime.      folic acid 1 MG tablet   Commonly known as: FOLVITE   Take 1 tablet (1 mg total) by mouth daily.       HYDROcodone-acetaminophen 5-325 MG per tablet   Commonly known as: NORCO/VICODIN   Take 0.5-1 tablets by mouth every 8 (eight) hours as needed.      lidocaine 5 %   Commonly known as: LIDODERM   Place 1 patch onto the skin daily. To low back for back pain. Use at bedtime. Remove & Discard patch within 12 hours or as directed by MD      thiamine 100 MG tablet   Take 1 tablet (100 mg total) by mouth daily.      traMADol 50 MG tablet   Commonly known as: ULTRAM   Take 2 tablets (100 mg total) by mouth every 6 (six) hours as needed for pain.           Follow-up Information    Call [facility]. (Schedule an appointment if interested for depression or substance abuse)    Contact information:   [address] [place], [place]  [phone]      Follow up with [facility], MD on 03/06/2012. (12:15)    Contact information:   [address]  [address]  [place] [address] [phone]           The results of significant diagnostics from this hospitalization (including imaging, microbiology, ancillary and laboratory) are listed below for reference.    Significant Diagnostic Studies: US Abdomen Complete  02/24/2012  *RADIOLOGY REPORT*  Clinical Data:  History of hepatitis and splenectomy.  Question cirrhosis.  COMPLETE ABDOMINAL ULTRASOUND  Comparison:  Abdominal ultrasound 07/01/2010.  Abdominal CT 06/06/2010.  Findings:  Gallbladder: The gallbladder is contracted with diffuse mild wall thickening to 3.2 mm.  There is no evidence of gallstones or sonographic Murphy's sign.  Common bile duct:   Normal in caliber without filling defects.  Liver:  The hepatic echogenicity is diffusely increased.  No focal lesions are identified.  There is no perihepatic ascites.  IVC:  Visualized portions appear unremarkable.  Pancreas:  Visualized portions appear unremarkable.  Spleen:  Surgically absent.  Right Kidney:   The renal cortical thickness and echogenicity are  preserved.  There is no hydronephrosis or focal abnormality. Renal length is 12.6 cm.  Left Kidney:   The renal cortical thickness and echogenicity are preserved.  There is no hydronephrosis or focal abnormality. Renal length is 11.0 cm.  Abdominal aorta:  Visualized portions appear unremarkable.  IMPRESSION:  1.  Diffuse gallbladder wall thickening without evidence of cholelithiasis or sonographic Murphy's sign.  As correlated with prior study, this could reflect adenomatosis or adjacent inflammation. 2.  Nonspecific increased hepatic echogenicity most consistent with steatosis.  No specific signs of cirrhosis identified by ultrasound.   Original Report Authenticated By: Gerrianne Scale, M.D.     Microbiology: Recent Results (from the past 240 hour(s))  MRSA PCR SCREENING     Status: Normal   Collection Time   02/24/12  4:21 AM      Component Value Range Status Comment   MRSA by PCR NEGATIVE  NEGATIVE Final      Labs: Basic Metabolic Panel:  Lab 02/26/12 5409 02/25/12 0500 02/24/12 1140 02/24/12 0008  NA 136 138 143 150*  K 3.8 3.9 3.6 3.5  CL 101 104 107 91*  CO2 26 25 23 20   GLUCOSE 95 99 113* 87  BUN 10 10 6  5*  CREATININE 0.57 0.57 0.59 0.51  CALCIUM 9.2 8.9 8.8 8.8  MG -- -- -- --  PHOS -- -- -- --   Liver Function Tests:  Lab 02/26/12 0529 02/24/12 0008  AST 90* 152*  ALT 72* 93*  ALKPHOS 152* 95  BILITOT 0.6 0.5  PROT 7.1 8.5*  ALBUMIN 2.9* 3.7   No results found for this basename: LIPASE:5,AMYLASE:5 in the last 168 hours No results found for this basename: AMMONIA:5 in the last 168 hours CBC:  Lab 02/26/12 0529 02/25/12 0500 02/24/12 0008  WBC 9.8 8.7 10.0  NEUTROABS -- -- 3.7  HGB 13.7 13.7 14.8  HCT 40.8 40.9 42.8  MCV 94.7 93.2 92.6  PLT 122* 122* 125*   Cardiac Enzymes:  Lab 02/25/12 0500 02/24/12 0008  CKTOTAL 164 339*  CKMB -- --  CKMBINDEX -- --  TROPONINI -- --   BNP: BNP (last 3 results) No results found for this basename: PROBNP:3 in the  last 8760 hours CBG:  Lab 02/26/12 0318 02/25/12 2052  GLUCAP 92 84    Time coordinating discharge: 35 minutes  Signed:  Tamas Suen A  [facility] 02/26/2012, 2:34 PM

## 2012-08-26 ENCOUNTER — Emergency Department (HOSPITAL_COMMUNITY)
Admission: EM | Admit: 2012-08-26 | Discharge: 2012-08-28 | Disposition: A | Discharge status: Home / Self Care | Payer: Self-pay | Attending: Emergency Medicine | Admitting: Emergency Medicine

## 2012-08-26 ENCOUNTER — Encounter (HOSPITAL_COMMUNITY): Payer: Self-pay | Admitting: *Deleted

## 2012-08-26 DIAGNOSIS — G894 Chronic pain syndrome: Secondary | ICD-10-CM | POA: Insufficient documentation

## 2012-08-26 DIAGNOSIS — F17200 Nicotine dependence, unspecified, uncomplicated: Secondary | ICD-10-CM | POA: Insufficient documentation

## 2012-08-26 DIAGNOSIS — R45851 Suicidal ideations: Secondary | ICD-10-CM | POA: Insufficient documentation

## 2012-08-26 DIAGNOSIS — Z8739 Personal history of other diseases of the musculoskeletal system and connective tissue: Secondary | ICD-10-CM | POA: Insufficient documentation

## 2012-08-26 DIAGNOSIS — Z79899 Other long term (current) drug therapy: Secondary | ICD-10-CM | POA: Insufficient documentation

## 2012-08-26 DIAGNOSIS — Z8619 Personal history of other infectious and parasitic diseases: Secondary | ICD-10-CM | POA: Insufficient documentation

## 2012-08-26 DIAGNOSIS — F29 Unspecified psychosis not due to a substance or known physiological condition: Secondary | ICD-10-CM | POA: Insufficient documentation

## 2012-08-26 DIAGNOSIS — F1110 Opioid abuse, uncomplicated: Secondary | ICD-10-CM | POA: Insufficient documentation

## 2012-08-26 DIAGNOSIS — R197 Diarrhea, unspecified: Secondary | ICD-10-CM | POA: Insufficient documentation

## 2012-08-26 DIAGNOSIS — F329 Major depressive disorder, single episode, unspecified: Secondary | ICD-10-CM | POA: Insufficient documentation

## 2012-08-26 DIAGNOSIS — F3289 Other specified depressive episodes: Secondary | ICD-10-CM | POA: Insufficient documentation

## 2012-08-26 DIAGNOSIS — R443 Hallucinations, unspecified: Secondary | ICD-10-CM | POA: Insufficient documentation

## 2012-08-26 LAB — COMPREHENSIVE METABOLIC PANEL
ALT: 163 U/L — ABNORMAL HIGH (ref 0–53)
AST: 303 U/L — ABNORMAL HIGH (ref 0–37)
Alkaline Phosphatase: 99 U/L (ref 39–117)
CO2: 20 mEq/L (ref 19–32)
Calcium: 8.8 mg/dL (ref 8.4–10.5)
Potassium: 3.5 mEq/L (ref 3.5–5.1)
Sodium: 136 mEq/L (ref 135–145)
Total Protein: 8.8 g/dL — ABNORMAL HIGH (ref 6.0–8.3)

## 2012-08-26 LAB — RAPID URINE DRUG SCREEN, HOSP PERFORMED
Amphetamines: NOT DETECTED
Barbiturates: NOT DETECTED
Cocaine: NOT DETECTED
Opiates: POSITIVE — AB
Tetrahydrocannabinol: NOT DETECTED

## 2012-08-26 LAB — ACETAMINOPHEN LEVEL: Acetaminophen (Tylenol), Serum: <15 ug/mL (ref 10–30)

## 2012-08-26 LAB — CBC
Hemoglobin: 14.7 g/dL (ref 13.0–17.0)
MCH: 31.7 pg (ref 26.0–34.0)
MCHC: 34.3 g/dL (ref 30.0–36.0)
Platelets: 101 10*3/uL — ABNORMAL LOW (ref 150–400)
RBC: 4.63 MIL/uL (ref 4.22–5.81)

## 2012-08-26 MED ORDER — IBUPROFEN 600 MG PO TABS
600.0000 mg | ORAL_TABLET | Freq: Three times a day (TID) | ORAL | Status: DC | PRN
  Administered 2012-08-26: 600 mg via ORAL
  Filled 2012-08-26: qty 1

## 2012-08-26 MED ORDER — ONDANSETRON HCL 4 MG PO TABS
4.0000 mg | ORAL_TABLET | Freq: Three times a day (TID) | ORAL | Status: DC | PRN
  Administered 2012-08-26: 4 mg via ORAL
  Filled 2012-08-26: qty 1

## 2012-08-26 MED ORDER — LORAZEPAM 1 MG PO TABS
1.0000 mg | ORAL_TABLET | Freq: Three times a day (TID) | ORAL | Status: DC | PRN
  Administered 2012-08-26 – 2012-08-27 (×2): 1 mg via ORAL
  Filled 2012-08-26 (×2): qty 1

## 2012-08-26 MED ORDER — ALUM & MAG HYDROXIDE-SIMETH 200-200-20 MG/5ML PO SUSP
30.0000 mL | ORAL | Status: DC | PRN
  Filled 2012-08-26: qty 30

## 2012-08-26 MED ORDER — NICOTINE 21 MG/24HR TD PT24
21.0000 mg | MEDICATED_PATCH | Freq: Every day | TRANSDERMAL | Status: DC
  Filled 2012-08-26: qty 1

## 2012-08-26 NOTE — ED Notes (Signed)
Pt presents with mobile crisis. Reported to mobile crisis that he was having auditory hallucinations 2 days ago. Feeling suicidal with plan to stab self. Visitor also sts pt has had OD attempt in past. Reports pt can become hostile and aggressive when he gets upset. Denies HI.

## 2012-08-26 NOTE — ED Provider Notes (Signed)
History     CSN: [id number]  Arrival date & time 08/26/12  1458   First MD Initiated Contact with Patient 08/26/12 1625      Chief Complaint  Patient presents with  . Medical Clearance  . Suicidal    (Consider location/radiation/quality/duration/timing/severity/associated sxs/prior treatment) HPI Comments: Pt comes in with cc of heroine use and suicidal ideations. Hx of hepatitis, schizoaffective disorder, and depression. States that he is supposed to be on suboxone -  but haven't been able to take them for the past month as he cvant afford it. He started using heroine again, last use y'day. Pt is not prescribed any other meds, and no antipsychotics that he can think of. He is suicidal, has OD'd in the past, no plan at this time. He also states that he has been having auditory and visual hallucination. Caregiver at bedside and states that when on suboxone, he is feeling completely normal.   The history is provided by the patient.    Past Medical History  Diagnosis Date  . Hepatitis     hep b and c  . Ruptured spleen   . Pain in thoracic spine   . Chronic pain syndrome   . Depression     Past Surgical History  Procedure Laterality Date  . No past surgeries      spleen removed 5 yrs ago  . Spllenectomy      No family history on file.  History  Substance Use Topics  . Smoking status: Current Every Day Smoker -- 1.00 packs/day for 15 years    Types: Cigarettes  . Smokeless tobacco: Not on file  . Alcohol Use: 50.4 oz/week    84 Cans of beer per week      Review of Systems  Constitutional: Negative for fever, chills and activity change.  HENT: Negative for neck pain.   Eyes: Negative for visual disturbance.  Respiratory: Negative for cough, chest tightness and shortness of breath.   Cardiovascular: Negative for chest pain.  Gastrointestinal: Positive for nausea and diarrhea. Negative for abdominal distention.  Genitourinary: Negative for dysuria, enuresis and  difficulty urinating.  Musculoskeletal: Negative for arthralgias.  Neurological: Negative for dizziness, light-headedness and headaches.  Psychiatric/Behavioral: Positive for suicidal ideas and hallucinations. Negative for confusion.    Allergies  Sulfa antibiotics  Home Medications   Current Outpatient Rx  Name  Route  Sig  Dispense  Refill  . chlordiazePOXIDE (LIBRIUM) 10 MG capsule      Take 10 mg three times a day for 1 day, then 10 mg twice daily for 1 day, then 10 mg daily for 1 day then discontinue. May take an extra 10 mg once daily as needed for anxiety and alcohol withdrawal.   10 capsule   0   . divalproex (DEPAKOTE ER) 500 MG 24 hr tablet   Oral   Take 1 tablet (500 mg total) by mouth at bedtime.   30 tablet   0   . folic acid (FOLVITE) 1 MG tablet   Oral   Take 1 tablet (1 mg total) by mouth daily.   30 tablet   0   . thiamine 100 MG tablet   Oral   Take 1 tablet (100 mg total) by mouth daily.   30 tablet   0     BP 133/93  Pulse 83  Temp(Src) 98.2 F (36.8 C) (Oral)  Resp 16  SpO2 99%  Physical Exam  Nursing note and vitals reviewed. Constitutional: He is oriented  to person, place, and time. He appears well-developed.  HENT:  Head: Normocephalic and atraumatic.  Eyes: Conjunctivae and EOM are normal. Pupils are equal, round, and reactive to light.  Neck: Normal range of motion. Neck supple.  Cardiovascular: Normal rate and regular rhythm.   Pulmonary/Chest: Effort normal and breath sounds normal.  Abdominal: Soft. Bowel sounds are normal. He exhibits no distension. There is no tenderness. There is no rebound and no guarding.  Neurological: He is alert and oriented to person, place, and time.  Skin: Skin is warm.    ED Course  Procedures (including critical care time)  Labs Reviewed  CBC - Abnormal; Notable for the following:    Platelets 101 (*)    All other components within normal limits  COMPREHENSIVE METABOLIC PANEL - Abnormal;  Notable for the following:    Total Protein 8.8 (*)    AST 303 (*)    ALT 163 (*)    All other components within normal limits  ETHANOL - Abnormal; Notable for the following:    Alcohol, Ethyl (B) 73 (*)    All other components within normal limits  SALICYLATE LEVEL - Abnormal; Notable for the following:    Salicylate Lvl <2.0 (*)    All other components within normal limits  ACETAMINOPHEN LEVEL  URINE RAPID DRUG SCREEN (HOSP PERFORMED)   No results found.   No diagnosis found.    MDM  DDx: Depression Bipolar disorder Schizophrenia Substance abuse Suicidal ideation Acute withdrawal  Pt comes in with cc of substance abuse, suicidal ideation and is also having hallucinations. We will call tele psych for likely admission. Is having some heroine withdrawals - will not give suboxone for now.   Derwood Kaplan, MD 08/26/12 1740

## 2012-08-26 NOTE — ED Notes (Signed)
Pt reports n/v/d today and yesterday. C/o abd pain. Also reports he feels he is withdrawing from suboxone. Has not had it in 2.5 months. Reports using heroin when he no longer could get suboxone. Last used yesterday. Pt sts he drinks anything he can get in his hand but caregiver sts he hasn't had any etoh in 2 weeks.

## 2012-08-27 ENCOUNTER — Encounter (HOSPITAL_COMMUNITY): Payer: Self-pay | Admitting: Licensed Clinical Social Worker

## 2012-08-27 ENCOUNTER — Inpatient Hospital Stay (HOSPITAL_COMMUNITY): Admission: AD | Admit: 2012-08-27 | Payer: Self-pay | Source: Intra-hospital | Admitting: Psychiatry

## 2012-08-27 ENCOUNTER — Telehealth (HOSPITAL_COMMUNITY): Payer: Self-pay | Admitting: Licensed Clinical Social Worker

## 2012-08-27 MED ORDER — ONDANSETRON 4 MG PO TBDP: 4.0000 mg | ORAL_TABLET | Freq: Four times a day (QID) | ORAL | Status: DC | PRN

## 2012-08-27 MED ORDER — CLONIDINE HCL 0.1 MG PO TABS: 0.1000 mg | ORAL_TABLET | Freq: Every day | ORAL | Status: DC

## 2012-08-27 MED ORDER — HYDROXYZINE HCL 25 MG PO TABS
25.0000 mg | ORAL_TABLET | Freq: Four times a day (QID) | ORAL | Status: DC | PRN
  Administered 2012-08-27: 25 mg via ORAL
  Filled 2012-08-27: qty 1

## 2012-08-27 MED ORDER — FOLIC ACID 1 MG PO TABS
1.0000 mg | ORAL_TABLET | Freq: Every day | ORAL | Status: DC
  Administered 2012-08-27 – 2012-08-28 (×2): 1 mg via ORAL
  Filled 2012-08-27 (×2): qty 1

## 2012-08-27 MED ORDER — DICYCLOMINE HCL 20 MG PO TABS: 20.0000 mg | ORAL_TABLET | Freq: Four times a day (QID) | ORAL | Status: DC | PRN

## 2012-08-27 MED ORDER — LORAZEPAM 2 MG/ML IJ SOLN: 1.0000 mg | Freq: Four times a day (QID) | INTRAMUSCULAR | Status: DC | PRN

## 2012-08-27 MED ORDER — NAPROXEN 500 MG PO TABS: 500.0000 mg | ORAL_TABLET | Freq: Two times a day (BID) | ORAL | Status: DC | PRN

## 2012-08-27 MED ORDER — METHOCARBAMOL 500 MG PO TABS: 500.0000 mg | ORAL_TABLET | Freq: Three times a day (TID) | ORAL | Status: DC | PRN

## 2012-08-27 MED ORDER — LOPERAMIDE HCL 2 MG PO CAPS
2.0000 mg | ORAL_CAPSULE | ORAL | Status: DC | PRN
  Administered 2012-08-27: 4 mg via ORAL
  Filled 2012-08-27: qty 2

## 2012-08-27 MED ORDER — VITAMIN B-1 100 MG PO TABS
100.0000 mg | ORAL_TABLET | Freq: Every day | ORAL | Status: DC
  Administered 2012-08-27 – 2012-08-28 (×2): 100 mg via ORAL
  Filled 2012-08-27 (×2): qty 1

## 2012-08-27 MED ORDER — ADULT MULTIVITAMIN W/MINERALS CH
1.0000 | ORAL_TABLET | Freq: Every day | ORAL | Status: DC
  Administered 2012-08-27 – 2012-08-28 (×2): 1 via ORAL
  Filled 2012-08-27 (×2): qty 1

## 2012-08-27 MED ORDER — THIAMINE HCL 100 MG/ML IJ SOLN: 100.0000 mg | Freq: Every day | INTRAMUSCULAR | Status: DC

## 2012-08-27 MED ORDER — LORAZEPAM 1 MG PO TABS
1.0000 mg | ORAL_TABLET | Freq: Four times a day (QID) | ORAL | Status: DC | PRN
  Administered 2012-08-27: 1 mg via ORAL
  Filled 2012-08-27: qty 1

## 2012-08-27 MED ORDER — CLONIDINE HCL 0.1 MG PO TABS: 0.1000 mg | ORAL_TABLET | ORAL | Status: DC

## 2012-08-27 MED ORDER — CLONIDINE HCL 0.1 MG PO TABS
0.1000 mg | ORAL_TABLET | Freq: Four times a day (QID) | ORAL | Status: DC
  Administered 2012-08-27 – 2012-08-28 (×5): 0.1 mg via ORAL
  Filled 2012-08-27 (×5): qty 1

## 2012-08-27 NOTE — ED Notes (Signed)
Feeling much better today than yesterday, no diarrhea noted this afternoon, abdomen feels much better, denies Si ro Hi and no AVH at this time. Affect is flat butpatient is pleasant and cooperative.

## 2012-08-27 NOTE — ED Provider Notes (Signed)
Pt sleeping; RNs report no concerns overnight.   John M Bednar, MD 08/28/12 1344 

## 2012-08-27 NOTE — Progress Notes (Signed)
ED Cm reviewed EDP note and ED encounter summary clinical information sPt previously assisted by [facility] for medication assistance (depakote) on 02/26/12

## 2012-08-27 NOTE — ED Notes (Signed)
2 bags in locker 37 

## 2012-08-28 ENCOUNTER — Telehealth (HOSPITAL_COMMUNITY): Payer: Self-pay | Admitting: Emergency Medicine
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
## 2012-08-28 NOTE — ED Notes (Signed)
Pt in room lying on bed with eyes opened. Denies having any needs at this time. Reports he is waiting for his ride to go home. Will cont to care for pt until ride arrives.

## 2012-08-28 NOTE — ED Provider Notes (Signed)
Been seen by telepsych, Dr. Trisha Mangle, who feels the patient is appropriate for discharge and outpatient followup  Olivia Mackie, MD 08/28/12 [phone]

## 2012-08-28 NOTE — ED Notes (Signed)
D/C instructions given with understanding stated. Belongings returned. escorted by staff to front of building. Ambulatory, denies pain.

## 2012-10-04 ENCOUNTER — Emergency Department (HOSPITAL_COMMUNITY)
Admission: EM | Admit: 2012-10-04 | Discharge: 2012-10-04 | Disposition: A | Discharge status: Other Acute Inpatient Hospital | Payer: Self-pay | Attending: Emergency Medicine | Admitting: Emergency Medicine

## 2012-10-04 ENCOUNTER — Encounter (HOSPITAL_COMMUNITY): Payer: Self-pay | Admitting: *Deleted

## 2012-10-04 ENCOUNTER — Other Ambulatory Visit: Payer: Self-pay

## 2012-10-04 ENCOUNTER — Inpatient Hospital Stay (HOSPITAL_COMMUNITY)
Admission: AD | Admit: 2012-10-04 | Discharge: 2012-10-11 | DRG: 897 | Disposition: A | Discharge status: Home / Self Care | Payer: No Typology Code available for payment source | Source: Intra-hospital | Attending: Psychiatry | Admitting: Psychiatry

## 2012-10-04 ENCOUNTER — Emergency Department (HOSPITAL_COMMUNITY): Payer: Self-pay

## 2012-10-04 DIAGNOSIS — G894 Chronic pain syndrome: Secondary | ICD-10-CM | POA: Diagnosis present

## 2012-10-04 DIAGNOSIS — F1120 Opioid dependence, uncomplicated: Secondary | ICD-10-CM

## 2012-10-04 DIAGNOSIS — F1910 Other psychoactive substance abuse, uncomplicated: Secondary | ICD-10-CM

## 2012-10-04 DIAGNOSIS — F1010 Alcohol abuse, uncomplicated: Secondary | ICD-10-CM

## 2012-10-04 DIAGNOSIS — F17200 Nicotine dependence, unspecified, uncomplicated: Secondary | ICD-10-CM | POA: Insufficient documentation

## 2012-10-04 DIAGNOSIS — F10229 Alcohol dependence with intoxication, unspecified: Secondary | ICD-10-CM

## 2012-10-04 DIAGNOSIS — F329 Major depressive disorder, single episode, unspecified: Secondary | ICD-10-CM | POA: Insufficient documentation

## 2012-10-04 DIAGNOSIS — T50991A Poisoning by other drugs, medicaments and biological substances, accidental (unintentional), initial encounter: Secondary | ICD-10-CM | POA: Insufficient documentation

## 2012-10-04 DIAGNOSIS — F3289 Other specified depressive episodes: Secondary | ICD-10-CM | POA: Insufficient documentation

## 2012-10-04 DIAGNOSIS — R45851 Suicidal ideations: Secondary | ICD-10-CM

## 2012-10-04 DIAGNOSIS — Z79899 Other long term (current) drug therapy: Secondary | ICD-10-CM

## 2012-10-04 DIAGNOSIS — Z8719 Personal history of other diseases of the digestive system: Secondary | ICD-10-CM | POA: Insufficient documentation

## 2012-10-04 DIAGNOSIS — F10239 Alcohol dependence with withdrawal, unspecified: Secondary | ICD-10-CM

## 2012-10-04 DIAGNOSIS — F1020 Alcohol dependence, uncomplicated: Secondary | ICD-10-CM

## 2012-10-04 DIAGNOSIS — R748 Abnormal levels of other serum enzymes: Secondary | ICD-10-CM

## 2012-10-04 DIAGNOSIS — F32A Depression, unspecified: Secondary | ICD-10-CM

## 2012-10-04 DIAGNOSIS — F39 Unspecified mood [affective] disorder: Secondary | ICD-10-CM | POA: Diagnosis present

## 2012-10-04 DIAGNOSIS — K089 Disorder of teeth and supporting structures, unspecified: Secondary | ICD-10-CM | POA: Insufficient documentation

## 2012-10-04 DIAGNOSIS — R569 Unspecified convulsions: Secondary | ICD-10-CM | POA: Insufficient documentation

## 2012-10-04 DIAGNOSIS — Z8739 Personal history of other diseases of the musculoskeletal system and connective tissue: Secondary | ICD-10-CM | POA: Insufficient documentation

## 2012-10-04 DIAGNOSIS — T50992A Poisoning by other drugs, medicaments and biological substances, intentional self-harm, initial encounter: Secondary | ICD-10-CM | POA: Insufficient documentation

## 2012-10-04 DIAGNOSIS — R109 Unspecified abdominal pain: Secondary | ICD-10-CM | POA: Insufficient documentation

## 2012-10-04 DIAGNOSIS — Z862 Personal history of diseases of the blood and blood-forming organs and certain disorders involving the immune mechanism: Secondary | ICD-10-CM | POA: Insufficient documentation

## 2012-10-04 DIAGNOSIS — F1110 Opioid abuse, uncomplicated: Secondary | ICD-10-CM | POA: Diagnosis present

## 2012-10-04 DIAGNOSIS — Z8669 Personal history of other diseases of the nervous system and sense organs: Secondary | ICD-10-CM | POA: Insufficient documentation

## 2012-10-04 LAB — RAPID URINE DRUG SCREEN, HOSP PERFORMED
Amphetamines: NOT DETECTED
Benzodiazepines: NOT DETECTED
Cocaine: NOT DETECTED
Opiates: POSITIVE — AB

## 2012-10-04 LAB — COMPREHENSIVE METABOLIC PANEL
ALT: 103 U/L — ABNORMAL HIGH (ref 0–53)
AST: 198 U/L — ABNORMAL HIGH (ref 0–37)
Albumin: 3.4 g/dL — ABNORMAL LOW (ref 3.5–5.2)
CO2: 23 mEq/L (ref 19–32)
Calcium: 8.5 mg/dL (ref 8.4–10.5)
GFR calc non Af Amer: >90 mL/min (ref >90)
Sodium: 141 mEq/L (ref 135–145)
Total Protein: 7.7 g/dL (ref 6.0–8.3)

## 2012-10-04 LAB — URINALYSIS, ROUTINE W REFLEX MICROSCOPIC
Glucose, UA: NEGATIVE mg/dL
Leukocytes, UA: NEGATIVE
Nitrite: NEGATIVE
Specific Gravity, Urine: 1.018 (ref 1.005–1.030)
pH: 5.5 (ref 5.0–8.0)

## 2012-10-04 LAB — POCT I-STAT, CHEM 8
BUN: 7 mg/dL (ref 6–23)
Calcium, Ion: 1.08 mmol/L — ABNORMAL LOW (ref 1.12–1.23)
Chloride: 110 mEq/L (ref 96–112)
Creatinine, Ser: 0.8 mg/dL (ref 0.50–1.35)
TCO2: 22 mmol/L (ref 0–100)

## 2012-10-04 LAB — CBC
MCH: 31.5 pg (ref 26.0–34.0)
Platelets: 77 10*3/uL — ABNORMAL LOW (ref 150–400)
RBC: 4.06 MIL/uL — ABNORMAL LOW (ref 4.22–5.81)
RDW: 14.8 % (ref 11.5–15.5)

## 2012-10-04 MED ORDER — CHLORDIAZEPOXIDE HCL 25 MG PO CAPS
25.0000 mg | ORAL_CAPSULE | Freq: Four times a day (QID) | ORAL | Status: AC
  Administered 2012-10-05 – 2012-10-06 (×5): 25 mg via ORAL
  Filled 2012-10-04 (×5): qty 1

## 2012-10-04 MED ORDER — MAGNESIUM HYDROXIDE 400 MG/5ML PO SUSP: 30.0000 mL | Freq: Every day | ORAL | Status: DC | PRN

## 2012-10-04 MED ORDER — CHLORDIAZEPOXIDE HCL 25 MG PO CAPS: 25.0000 mg | ORAL_CAPSULE | Freq: Four times a day (QID) | ORAL | Status: AC | PRN

## 2012-10-04 MED ORDER — ZOLPIDEM TARTRATE 10 MG PO TABS: 10.0000 mg | ORAL_TABLET | Freq: Every evening | ORAL | Status: DC | PRN

## 2012-10-04 MED ORDER — ADULT MULTIVITAMIN W/MINERALS CH
1.0000 | ORAL_TABLET | Freq: Every day | ORAL | Status: DC
  Administered 2012-10-05 – 2012-10-11 (×7): 1 via ORAL
  Filled 2012-10-04 (×9): qty 1

## 2012-10-04 MED ORDER — VITAMIN B-1 100 MG PO TABS
100.0000 mg | ORAL_TABLET | Freq: Every day | ORAL | Status: DC
  Administered 2012-10-04: 100 mg via ORAL
  Filled 2012-10-04: qty 1

## 2012-10-04 MED ORDER — CHLORDIAZEPOXIDE HCL 25 MG PO CAPS
25.0000 mg | ORAL_CAPSULE | Freq: Every day | ORAL | Status: AC
  Administered 2012-10-09: 25 mg via ORAL
  Filled 2012-10-04: qty 1

## 2012-10-04 MED ORDER — IBUPROFEN 600 MG PO TABS
600.0000 mg | ORAL_TABLET | Freq: Three times a day (TID) | ORAL | Status: DC | PRN
  Administered 2012-10-04: 600 mg via ORAL
  Filled 2012-10-04: qty 1

## 2012-10-04 MED ORDER — THIAMINE HCL 100 MG/ML IJ SOLN
100.0000 mg | Freq: Once | INTRAMUSCULAR | Status: AC
  Administered 2012-10-04: 100 mg via INTRAMUSCULAR

## 2012-10-04 MED ORDER — LORAZEPAM 1 MG PO TABS
1.0000 mg | ORAL_TABLET | Freq: Three times a day (TID) | ORAL | Status: DC | PRN
  Administered 2012-10-04: 1 mg via ORAL
  Filled 2012-10-04: qty 1

## 2012-10-04 MED ORDER — LOPERAMIDE HCL 2 MG PO CAPS
4.0000 mg | ORAL_CAPSULE | Freq: Once | ORAL | Status: AC
  Administered 2012-10-04: 4 mg via ORAL
  Filled 2012-10-04: qty 2

## 2012-10-04 MED ORDER — ONDANSETRON HCL 4 MG PO TABS: 4.0000 mg | ORAL_TABLET | Freq: Three times a day (TID) | ORAL | Status: DC | PRN

## 2012-10-04 MED ORDER — DIVALPROEX SODIUM ER 500 MG PO TB24
1000.0000 mg | ORAL_TABLET | Freq: Every day | ORAL | Status: DC
  Administered 2012-10-04 – 2012-10-10 (×7): 1000 mg via ORAL
  Filled 2012-10-04 (×3): qty 2
  Filled 2012-10-04: qty 28
  Filled 2012-10-04 (×4): qty 2

## 2012-10-04 MED ORDER — CHLORDIAZEPOXIDE HCL 25 MG PO CAPS
25.0000 mg | ORAL_CAPSULE | Freq: Three times a day (TID) | ORAL | Status: AC
  Administered 2012-10-06 – 2012-10-07 (×3): 25 mg via ORAL
  Filled 2012-10-04 (×3): qty 1

## 2012-10-04 MED ORDER — SODIUM CHLORIDE 0.9 % IV BOLUS (SEPSIS)
1000.0000 mL | Freq: Once | INTRAVENOUS | Status: AC
  Administered 2012-10-04: 1000 mL via INTRAVENOUS

## 2012-10-04 MED ORDER — ACETAMINOPHEN 325 MG PO TABS
650.0000 mg | ORAL_TABLET | ORAL | Status: DC | PRN
  Administered 2012-10-04 (×2): 650 mg via ORAL
  Filled 2012-10-04 (×2): qty 2

## 2012-10-04 MED ORDER — CHLORDIAZEPOXIDE HCL 25 MG PO CAPS
50.0000 mg | ORAL_CAPSULE | Freq: Once | ORAL | Status: AC
  Administered 2012-10-04: 50 mg via ORAL
  Filled 2012-10-04: qty 2

## 2012-10-04 MED ORDER — ALUM & MAG HYDROXIDE-SIMETH 200-200-20 MG/5ML PO SUSP: 30.0000 mL | ORAL | Status: DC | PRN

## 2012-10-04 MED ORDER — DIVALPROEX SODIUM ER 500 MG PO TB24
500.0000 mg | ORAL_TABLET | Freq: Every day | ORAL | Status: DC
  Filled 2012-10-04: qty 1

## 2012-10-04 MED ORDER — LOPERAMIDE HCL 2 MG PO CAPS
2.0000 mg | ORAL_CAPSULE | ORAL | Status: AC | PRN
  Administered 2012-10-05: 4 mg via ORAL

## 2012-10-04 MED ORDER — VALPROATE SODIUM 500 MG/5ML IV SOLN
1000.0000 mg | Freq: Once | INTRAVENOUS | Status: AC
  Administered 2012-10-04: 1000 mg via INTRAVENOUS
  Filled 2012-10-04: qty 10

## 2012-10-04 MED ORDER — HYDROXYZINE HCL 25 MG PO TABS
25.0000 mg | ORAL_TABLET | Freq: Four times a day (QID) | ORAL | Status: AC | PRN
  Administered 2012-10-04 – 2012-10-06 (×4): 25 mg via ORAL

## 2012-10-04 MED ORDER — CHLORDIAZEPOXIDE HCL 25 MG PO CAPS
25.0000 mg | ORAL_CAPSULE | ORAL | Status: AC
  Administered 2012-10-07 – 2012-10-08 (×2): 25 mg via ORAL
  Filled 2012-10-04 (×2): qty 1

## 2012-10-04 MED ORDER — NICOTINE 21 MG/24HR TD PT24
21.0000 mg | MEDICATED_PATCH | Freq: Every day | TRANSDERMAL | Status: DC
  Administered 2012-10-04: 21 mg via TRANSDERMAL
  Filled 2012-10-04: qty 1

## 2012-10-04 MED ORDER — ACETAMINOPHEN 325 MG PO TABS
650.0000 mg | ORAL_TABLET | Freq: Four times a day (QID) | ORAL | Status: DC | PRN
  Administered 2012-10-04 – 2012-10-10 (×3): 650 mg via ORAL

## 2012-10-04 MED ORDER — NICOTINE 21 MG/24HR TD PT24
21.0000 mg | MEDICATED_PATCH | Freq: Every day | TRANSDERMAL | Status: DC
  Administered 2012-10-05 – 2012-10-11 (×7): 21 mg via TRANSDERMAL
  Filled 2012-10-04 (×5): qty 1
  Filled 2012-10-04: qty 14
  Filled 2012-10-04 (×5): qty 1
  Filled 2012-10-04: qty 14

## 2012-10-04 MED ORDER — ONDANSETRON 4 MG PO TBDP: 4.0000 mg | ORAL_TABLET | Freq: Four times a day (QID) | ORAL | Status: AC | PRN

## 2012-10-04 MED ORDER — FOLIC ACID 1 MG PO TABS
1.0000 mg | ORAL_TABLET | Freq: Every day | ORAL | Status: DC
  Administered 2012-10-04: 1 mg via ORAL
  Filled 2012-10-04: qty 1

## 2012-10-04 MED ORDER — LOPERAMIDE HCL 2 MG PO CAPS: 2.0000 mg | ORAL_CAPSULE | ORAL | Status: DC | PRN

## 2012-10-04 MED ORDER — DIVALPROEX SODIUM ER 500 MG PO TB24
1000.0000 mg | ORAL_TABLET | Freq: Every day | ORAL | Status: DC
  Filled 2012-10-04: qty 2

## 2012-10-04 MED ORDER — VITAMIN B-1 100 MG PO TABS
100.0000 mg | ORAL_TABLET | Freq: Every day | ORAL | Status: DC
  Administered 2012-10-05 – 2012-10-11 (×7): 100 mg via ORAL
  Filled 2012-10-04 (×9): qty 1

## 2012-10-04 NOTE — [facility] Counselor (Signed)
Patient accepted to [facility] by Antoine Poche to [facility]; Room 300-1.

## 2012-10-04 NOTE — ED Notes (Signed)
WUJ:WJXB<JY> Expected date:10/04/12<BR> Expected time: 1:54 AM<BR> Means of arrival:Ambulance<BR> Comments:<BR> Possible overdose

## 2012-10-04 NOTE — ED Provider Notes (Addendum)
History     CSN: [id number]  Arrival date & time 10/04/12  0216   First MD Initiated Contact with Patient 10/04/12 0225      Chief Complaint  Patient presents with  . Drug Overdose    (Consider location/radiation/quality/duration/timing/severity/associated sxs/prior treatment) HPI History provided by EMS and limited history per patient. Patient states he was trying to kill himself by taking medications drinking alcohol prior to arrival. He has been having abdominal pain dental pain. EMS reports seizure activity prior to coming to the emergency department. Some post ictal activity. No incontinence. No tongue laceration. His prescribed Depakote. Patient is unable to say what medications he took or how long ago he took them.  Past Medical History  Diagnosis Date  . Hepatitis     hep b and c  . Ruptured spleen   . Pain in thoracic spine   . Chronic pain syndrome   . Depression     Past Surgical History  Procedure Laterality Date  . No past surgeries      spleen removed 5 yrs ago  . Spllenectomy      History reviewed. No pertinent family history.  History  Substance Use Topics  . Smoking status: Current Every Day Smoker -- 1.00 packs/day for 15 years    Types: Cigarettes  . Smokeless tobacco: Not on file  . Alcohol Use: 50.4 oz/week    84 Cans of beer per week      Review of Systems  Constitutional: Negative for fever and chills.  HENT: Negative for neck pain and neck stiffness.   Eyes: Negative for pain.  Respiratory: Negative for shortness of breath.   Cardiovascular: Negative for chest pain.  Gastrointestinal: Negative for abdominal pain.  Genitourinary: Negative for dysuria.  Musculoskeletal: Negative for back pain.  Skin: Negative for rash.  Neurological: Positive for seizures. Negative for headaches.  All other systems reviewed and are negative.    Allergies  Sulfa antibiotics  Home Medications   Current Outpatient Rx  Name  Route  Sig  Dispense   Refill  . chlordiazePOXIDE (LIBRIUM) 10 MG capsule      Take 10 mg three times a day for 1 day, then 10 mg twice daily for 1 day, then 10 mg daily for 1 day then discontinue. May take an extra 10 mg once daily as needed for anxiety and alcohol withdrawal.   10 capsule   0   . divalproex (DEPAKOTE ER) 500 MG 24 hr tablet   Oral   Take 1 tablet (500 mg total) by mouth at bedtime.   30 tablet   0   . folic acid (FOLVITE) 1 MG tablet   Oral   Take 1 tablet (1 mg total) by mouth daily.   30 tablet   0   . thiamine 100 MG tablet   Oral   Take 1 tablet (100 mg total) by mouth daily.   30 tablet   0     BP 146/113  Pulse 74  Temp(Src) 97.5 F (36.4 C) (Rectal)  Resp 18  SpO2 99%  Physical Exam  Constitutional: He is oriented to person, place, and time. He appears well-developed and well-nourished.  HENT:  Head: Normocephalic and atraumatic.  Mouth/Throat: Oropharynx is clear and moist.  Eyes: EOM are normal. Pupils are equal, round, and reactive to light. No scleral icterus.  Neck: Normal range of motion. Neck supple.  Cardiovascular: Normal rate, regular rhythm and intact distal pulses.   Pulmonary/Chest: Effort normal and  breath sounds normal. No stridor. No respiratory distress.  Abdominal: Soft. Bowel sounds are normal. He exhibits no distension and no mass. There is no tenderness. There is no rebound and no guarding.  Musculoskeletal: Normal range of motion. He exhibits no edema and no tenderness.  Neurological: He is alert and oriented to person, place, and time. He displays normal reflexes. No cranial nerve deficit. He exhibits normal muscle tone. Coordination normal.  Skin: Skin is warm and dry.    ED Course  Procedures (including critical care time)  Results for orders placed during the hospital encounter of 10/04/12  CBC      Result Value Range   WBC 7.7  4.0 - 10.5 K/uL   RBC 4.06 (*) 4.22 - 5.81 MIL/uL   Hemoglobin 12.8 (*) 13.0 - 17.0 g/dL   HCT 14.7 (*)  82.9 - 52.0 %   MCV 92.6  78.0 - 100.0 fL   MCH 31.5  26.0 - 34.0 pg   MCHC 34.0  30.0 - 36.0 g/dL   RDW 56.2  13.0 - 86.5 %   Platelets 77 (*) 150 - 400 K/uL  COMPREHENSIVE METABOLIC PANEL      Result Value Range   Sodium 141  135 - 145 mEq/L   Potassium 3.5  3.5 - 5.1 mEq/L   Chloride 106  96 - 112 mEq/L   CO2 23  19 - 32 mEq/L   Glucose, Bld 92  70 - 99 mg/dL   BUN 8  6 - 23 mg/dL   Creatinine, Ser 7.84  0.50 - 1.35 mg/dL   Calcium 8.5  8.4 - 69.6 mg/dL   Total Protein 7.7  6.0 - 8.3 g/dL   Albumin 3.4 (*) 3.5 - 5.2 g/dL   AST 295 (*) 0 - 37 U/L   ALT 103 (*) 0 - 53 U/L   Alkaline Phosphatase 96  39 - 117 U/L   Total Bilirubin 0.7  0.3 - 1.2 mg/dL   GFR calc non Af Amer >90  >90 mL/min   GFR calc Af Amer >90  >90 mL/min  ACETAMINOPHEN LEVEL      Result Value Range   Acetaminophen (Tylenol), Serum <15.0  10 - 30 ug/mL  SALICYLATE LEVEL      Result Value Range   Salicylate Lvl <2.0 (*) 2.8 - 20.0 mg/dL  URINALYSIS, ROUTINE W REFLEX MICROSCOPIC      Result Value Range   Color, Urine YELLOW  YELLOW   APPearance CLEAR  CLEAR   Specific Gravity, Urine 1.018  1.005 - 1.030   pH 5.5  5.0 - 8.0   Glucose, UA NEGATIVE  NEGATIVE mg/dL   Hgb urine dipstick NEGATIVE  NEGATIVE   Bilirubin Urine NEGATIVE  NEGATIVE   Ketones, ur NEGATIVE  NEGATIVE mg/dL   Protein, ur NEGATIVE  NEGATIVE mg/dL   Urobilinogen, UA 1.0  0.0 - 1.0 mg/dL   Nitrite NEGATIVE  NEGATIVE   Leukocytes, UA NEGATIVE  NEGATIVE  URINE RAPID DRUG SCREEN (HOSP PERFORMED)      Result Value Range   Opiates POSITIVE (*) NONE DETECTED   Cocaine NONE DETECTED  NONE DETECTED   Benzodiazepines NONE DETECTED  NONE DETECTED   Amphetamines NONE DETECTED  NONE DETECTED   Tetrahydrocannabinol NONE DETECTED  NONE DETECTED   Barbiturates NONE DETECTED  NONE DETECTED  ETHANOL      Result Value Range   Alcohol, Ethyl (B) 244 (*) 0 - 11 mg/dL  POCT I-STAT, CHEM 8  Result Value Range   Sodium 146 (*) 135 - 145 mEq/L    Potassium 3.6  3.5 - 5.1 mEq/L   Chloride 110  96 - 112 mEq/L   BUN 7  6 - 23 mg/dL   Creatinine, Ser 1.61  0.50 - 1.35 mg/dL   Glucose, Bld 95  70 - 99 mg/dL   Calcium, Ion 0.96 (*) 1.12 - 1.23 mmol/L   TCO2 22  0 - 100 mmol/L   Hemoglobin 13.9  13.0 - 17.0 g/dL   HCT 04.5  40.9 - 81.1 %   Ct Head Wo Contrast  10/04/2012  *RADIOLOGY REPORT*  Clinical Data: Unresponsive; took multiple medications.  CT HEAD WITHOUT CONTRAST  Technique:  Contiguous axial images were obtained from the base of the skull through the vertex without contrast.  Comparison: CT of the head performed 12/15 1011  Findings: There is no evidence of acute infarction, mass lesion, or intra- or extra-axial hemorrhage on CT.  Prominence of the sulci suggests mild cortical volume loss.  Mild cerebellar atrophy is noted.  The brainstem and fourth ventricle are within normal limits.  The basal ganglia are unremarkable in appearance.  The cerebral hemispheres demonstrate grossly normal gray-white differentiation. No mass effect or midline shift is seen.  There is no evidence of fracture; visualized osseous structures are unremarkable in appearance.  The visualized portions of the orbits are within normal limits.  The paranasal sinuses and mastoid air cells are well-aerated.  No significant soft tissue abnormalities are seen.  IMPRESSION:  1.  No acute intracranial pathology seen on CT. 2.  Mild cortical volume loss noted.   Original Report Authenticated By: Tonia Ghent, M.D.       Date: 10/04/2012  Rate: 75  Rhythm: normal sinus rhythm  QRS Axis: normal  Intervals: normal  ST/T Wave abnormalities: nonspecific T wave changes  Conduction Disutrbances:none  Narrative Interpretation:   Old EKG Reviewed: none available  5:59 AM patient's caregiver arrives bedside and reports history of seizures, history of suicidal ideation, as chronic pain. Depakote level pending. ACT consult/ plan psych admit   MDM  Overdose with suicidal  ideation  EKG. Labs. Imaging  IV fluids. Serial evaluations  PSY dispo        Sunnie Nielsen, MD 10/05/12 [id number]  Sunnie Nielsen, MD 10/18/12 [id number]

## 2012-10-04 NOTE — Progress Notes (Signed)
At the end of CM evaluation with pt he inquired if [facility] staff would assist with his diarrhea and detox off the pills he took [place] voiced concerns with pt hearing voices telling him to hurt himself.  CM discussed this with Psych ED RN, Minerva Areola.  Minerva Areola stated no diarrhea had been noted. CM did not note odor of stool or signs of stool during CM evaluation.  Clinical encounter noted etoh and opiates positive with labs.  Cm reviewed resources provided to pt and Para March and their (pt's and Jeanette's)  voiced concerns with psych ED SW and psychiatrist.

## 2012-10-04 NOTE — ED Provider Notes (Signed)
Jessie Foot, ACT states accepted at [facility] by Dr Patterson Hammersmith, MD 10/04/12 1640

## 2012-10-04 NOTE — BH Assessment (Addendum)
Assessment Note   Antonio [name] is an 42 y.o. male who presents to the ED after overdosing on medications and drinking alcohol. Per EMS, patient was having a seizure when they arrived. CSW met with pt at bedside to complete [place] [facility] assessment.   Pt reports that since last Tuesday, he has had a tooth ache with unbearable pain. Patient reports last night he took a bunch of pills and drink a 12 pack of beer in order to kill himself because the pain was so bad. Pt reports auditory and visual hallucinations of seeing people walking and hearing people talking but not sure what they're saying. Pt denies HI. Pt reports that he has been to the hosptial before for the same thing of suicidal ideations and alcohol abuse.   Pt reprots drinking 12 pack of beer per day. Pt reports hat he lives with his friend Para March who is helping the patient. Patient states that he would like to return home. When asked if patient can contract for safety and not harm himself or others pt replied, "I hope so".  Patient not able to fully contract for safety.   Pt reports he has been diagnosed with Hep B and C and has had history of suicidal ideation int he past related to these diagnosis, which is documented in chart.    Pt states he did not follow up with a psychiatrist or counselor. Pt states he is not on any medications at this time.  Pt reports that he never got a call back on who to follow up with.   Pt family lives in [place] and only has support from [place]. Patient moved to [facility] when he was 70 and was homeless until he moved to [facility] with [place]. Per chart review, Para March sponsor pt.   CSW met with pt again along with pt friend Para March. Pt shared that he did have voices telling him to kill himself yesterday. Per pt friend, patient talk to people she doesn't see or hear often. Per Para March Pt has appointment with a dentist to assist with removing teeth. Pt has already had two teeth pulled. Patient also has a medicaid  hearing which is being postponed due to patient being in hospital.    Axis I: Major Depression, Recurrent severe with psychotic features, alcohol abuse Axis II: Deferred Axis III:  Past Medical History  Diagnosis Date  . Hepatitis     hep b and c  . Ruptured spleen   . Pain in thoracic spine   . Chronic pain syndrome   . Depression    Axis IV: economic problems, occupational problems, other psychosocial or environmental problems, problems related to social environment, problems with access to health care services and problems with primary support group Axis V: 11-20 some danger of hurting self or others possible OR occasionally fails to maintain minimal personal hygiene OR gross impairment in communication  Past Medical History:  Past Medical History  Diagnosis Date  . Hepatitis     hep b and c  . Ruptured spleen   . Pain in thoracic spine   . Chronic pain syndrome   . Depression     Past Surgical History  Procedure Laterality Date  . No past surgeries      spleen removed 5 yrs ago  . Spllenectomy      Family History: History reviewed. No pertinent family history.  Social History:  reports that he has been smoking Cigarettes.  He has a 15 pack-year smoking history. He does  not have any smokeless tobacco history on file. He reports that he drinks about 50.4 ounces of alcohol per week. He reports that he uses illicit drugs (Hydrocodone and Heroin) about 28 times per week.  Additional Social History:  Alcohol / Drug Use History of alcohol / drug use?: Yes Substance #1 Name of Substance 1: Alcohol  1 - Age of First Use: teens 1 - Amount (size/oz): 12 pack of beer 1 - Frequency: daily  1 - Duration: years 1 - Last Use / Amount: last night, 12 pack   CIWA: CIWA-Ar BP: 161/106 mmHg Pulse Rate: 66 COWS:    Allergies:  Allergies  Allergen Reactions  . Sulfa Antibiotics Swelling    Home Medications:  (Not in a hospital admission)  OB/GYN Status:  No LMP for male  patient.  General Assessment Data Location of Assessment: [facility] Living Arrangements: Other (Comment) (friend) Can pt return to current living arrangement?: Yes Admission Status: Voluntary Is patient capable of signing voluntary admission?: Yes Transfer from: Home Referral Source: Self/Family/Friend  Education Status Is patient currently in school?: No  Risk to self Suicidal Ideation: No-Not Currently/Within Last 6 Months Suicidal Intent: No Is patient at risk for suicide?: Yes Suicidal Plan?: Yes-Currently Present Specify Current Suicidal Plan: overdose on pills Access to Means: Yes Specify Access to Suicidal Means: patient took unknown amout of pills in attempt to kill self  What has been your use of drugs/alcohol within the last 12 months?: alcohol 12 pack  Previous Attempts/Gestures: Yes How many times?: 1 Other Self Harm Risks: no Triggers for Past Attempts: Unknown Intentional Self Injurious Behavior: None Family Suicide History: No Recent stressful life event(s): Other (Comment) (no job, living with friend, toothe ache ) Persecutory voices/beliefs?: No Depression: Yes Depression Symptoms: Despondent;Insomnia Substance abuse history and/or treatment for substance abuse?: Yes  Risk to Others Homicidal Ideation: No Thoughts of Harm to Others: No Current Homicidal Intent: No Current Homicidal Plan: No Access to Homicidal Means: No Identified Victim: no History of harm to others?: No Assessment of Violence: None Noted Violent Behavior Description: no Does patient have access to weapons?: No Criminal Charges Pending?: No Does patient have a court date: No  Psychosis Hallucinations: Auditory;Visual Delusions: None noted  Mental Status Report Appear/Hygiene: Disheveled Eye Contact: Good Motor Activity: Freedom of movement Speech: Logical/coherent Level of Consciousness: Alert Mood: Depressed Affect: Appropriate to circumstance Anxiety Level: Minimal Thought  Processes: Coherent;Relevant Judgement: Impaired Orientation: Person;Place;Time;Situation Obsessive Compulsive Thoughts/Behaviors: Minimal  Cognitive Functioning Concentration: Normal Memory: Recent Intact;Remote Intact IQ: Average Insight: Fair Impulse Control: Fair Appetite: Fair Sleep: Decreased Total Hours of Sleep: 3  ADLScreening [facility] Assessment Services) Patient's cognitive ability adequate to safely complete daily activities?: Yes Patient able to express need for assistance with ADLs?: Yes Independently performs ADLs?: Yes (appropriate for developmental age)  Abuse/Neglect [facility]) Physical Abuse: Denies;Denies, provider concerned (Comment) Verbal Abuse: Denies Sexual Abuse: Denies  Prior Inpatient Therapy Prior Inpatient Therapy: Yes  Prior Outpatient Therapy Prior Outpatient Therapy: No  ADL Screening (condition at time of admission) Patient's cognitive ability adequate to safely complete daily activities?: Yes Patient able to express need for assistance with ADLs?: Yes Independently performs ADLs?: Yes (appropriate for developmental age)       Abuse/Neglect Assessment (Assessment to be complete while patient is alone) Physical Abuse: Denies;Denies, provider concerned (Comment) Verbal Abuse: Denies Sexual Abuse: Denies          Additional Information 1:1 In Past 12 Months?: No CIRT Risk: No Elopement Risk: No Does  patient have medical clearance?: Yes     Disposition:  Disposition Initial Assessment Completed for this Encounter: Yes Disposition of Patient: Inpatient treatment program Type of inpatient treatment program: Adult  On Site Evaluation by:   Reviewed with Physician:     Catha Gosselin A 10/04/2012 12:27 PM

## 2012-10-04 NOTE — Tx Team (Signed)
Initial Interdisciplinary Treatment Plan  PATIENT STRENGTHS: (choose at least two) Average or above average intelligence Capable of independent living General fund of knowledge Motivation for treatment/growth Supportive family/friends  PATIENT STRESSORS: Financial difficulties Health problems Substance abuse Traumatic event   PROBLEM LIST: Problem List/Patient Goals Date to be addressed Date deferred Reason deferred Estimated date of resolution  S/p overdose      Substance abuse      "I am in chronic pain"      "I have some bad teeth that need to be pulled; please do something for the pain"      Depression                               DISCHARGE CRITERIA:  Adequate post-discharge living arrangements Improved stabilization in mood, thinking, and/or behavior Motivation to continue treatment in a less acute level of care Need for constant or close observation no longer present Verbal commitment to aftercare and medication compliance Withdrawal symptoms are absent or subacute and managed without 24-hour nursing intervention  PRELIMINARY DISCHARGE PLAN: Attend aftercare/continuing care group Attend PHP/IOP Outpatient therapy Return to previous living arrangement  PATIENT/FAMIILY INVOLVEMENT: This treatment plan has been presented to and reviewed with the patient, Antonio [name], and/or family member.  The patient and family have been given the opportunity to ask questions and make suggestions.  Jesus Genera [facility] 10/04/2012, 11:17 PM

## 2012-10-04 NOTE — ED Notes (Signed)
Per EMS:  Pt reports diffuse abdominal pain and dental pain x 2 days.  Took multiple medications tonight in attempt to kill himself.  When EMS arrived pt was having a 'seizure'.  Pt suddenly 'came to' after having the seizure.  No urinary incontience, no tongue injury.  Pt ambulatory.  20 (R) hand.  162/92, 92HA, 86CBG.

## 2012-10-04 NOTE — Consult Note (Signed)
Reason for Consult: Alcohol intoxication and s/p seizure, Overdose on pain medication with suicidal intent Referring Physician: Dr. Deloria Lair Harville is an 42 y.o. male.  HPI: Patient was seen and chart reviewed and patient came to the listed on emergency department via emergency medical services followed by an episode of seizure after overdose on alcohol and and pain medication. Reportedly she drank 12 cans of beer and overdosed on decrease of his pain medication with the intent to kill himself the patient reported he is tightly himself because of his tooth pain. Patient reported inconsistent reports of seeing and the feeding people walking across the hall way. Patient has previous history of intra-venous Heroin abuse or dependence and also  positive for hepatitis B and C. the patient stated he was disabled because of hepatitis B and C. and unable to work. 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 He has a sponsor who has been supportive to him. Patient has a history of acute psychiatric hospitalization for detox treatment in 2012 at [facility]. Patient to index screen was positive for opiates and a blood alcohol level was 244 on arrival.  MSE: Patient was awake and alert oriented and somewhat disheveled but the unshaven beard and long black hair. Patient stayed in mood is not good and affect was constricted that he has a normal rate rhythm and volume of speech his thought processes linear and goal-directed he feels he is action of overdoses this being a stupid. He continued and as says he is having a ideations, intentions and denies current plan. Patient has no homicidal ideation disturbed on the patient has no evidence of responding to internal stimuli.  Past Medical History  Diagnosis Date  . Hepatitis     hep b and c  . Ruptured spleen   . Pain in thoracic spine   . Chronic pain syndrome   . Depression     Past Surgical  History  Procedure Laterality Date  . No past surgeries      spleen removed 5 yrs ago  . Spllenectomy      History reviewed. No pertinent family history.  Social History:  reports that he has been smoking Cigarettes.  He has a 15 pack-year smoking history. He does not have any smokeless tobacco history on file. He reports that he drinks about 50.4 ounces of alcohol per week. He reports that he uses illicit drugs (Hydrocodone and Heroin) about 28 times per week.  Allergies:  Allergies  Allergen Reactions  . Sulfa Antibiotics Swelling    Medications: I have reviewed the patient's current medications.  Results for orders placed during the hospital encounter of 10/04/12 (from the past 48 hour(s))  VALPROIC ACID LEVEL     Status: Abnormal   Collection Time    10/04/12  2:43 AM      Result Value Range   Valproic Acid Lvl <10.0 (*) 50.0 - 100.0 ug/mL  CBC     Status: Abnormal   Collection Time    10/04/12  2:43 AM      Result Value Range   WBC 7.7  4.0 - 10.5 K/uL   RBC 4.06 (*) 4.22 - 5.81 MIL/uL   Hemoglobin 12.8 (*) 13.0 - 17.0 g/dL   HCT 09.8 (*) 11.9 - 14.7 %   MCV 92.6  78.0 - 100.0 fL   MCH 31.5  26.0 - 34.0 pg   MCHC 34.0  30.0 - 36.0  g/dL   RDW 16.1  09.6 - 04.5 %   Platelets 77 (*) 150 - 400 K/uL   Comment: REPEATED TO VERIFY     SPECIMEN CHECKED FOR CLOTS     PLATELET COUNT CONFIRMED BY SMEAR  COMPREHENSIVE METABOLIC PANEL     Status: Abnormal   Collection Time    10/04/12  2:43 AM      Result Value Range   Sodium 141  135 - 145 mEq/L   Potassium 3.5  3.5 - 5.1 mEq/L   Chloride 106  96 - 112 mEq/L   CO2 23  19 - 32 mEq/L   Glucose, Bld 92  70 - 99 mg/dL   BUN 8  6 - 23 mg/dL   Creatinine, Ser 4.09  0.50 - 1.35 mg/dL   Calcium 8.5  8.4 - 81.1 mg/dL   Total Protein 7.7  6.0 - 8.3 g/dL   Albumin 3.4 (*) 3.5 - 5.2 g/dL   AST 914 (*) 0 - 37 U/L   ALT 103 (*) 0 - 53 U/L   Alkaline Phosphatase 96  39 - 117 U/L   Total Bilirubin 0.7  0.3 - 1.2 mg/dL   GFR calc  non Af Amer >90  >90 mL/min   GFR calc Af Amer >90  >90 mL/min   Comment:            The eGFR has been calculated     using the CKD EPI equation.     This calculation has not been     validated in all clinical     situations.     eGFR's persistently     <90 mL/min signify     possible Chronic Kidney Disease.  ACETAMINOPHEN LEVEL     Status: None   Collection Time    10/04/12  2:43 AM      Result Value Range   Acetaminophen (Tylenol), Serum <15.0  10 - 30 ug/mL   Comment:            THERAPEUTIC CONCENTRATIONS VARY     SIGNIFICANTLY. A RANGE OF 10-30     ug/mL MAY BE AN EFFECTIVE     CONCENTRATION FOR MANY PATIENTS.     HOWEVER, SOME ARE BEST TREATED     AT CONCENTRATIONS OUTSIDE THIS     RANGE.     ACETAMINOPHEN CONCENTRATIONS     >150 ug/mL AT 4 HOURS AFTER     INGESTION AND >50 ug/mL AT 12     HOURS AFTER INGESTION ARE     OFTEN ASSOCIATED WITH TOXIC     REACTIONS.  SALICYLATE LEVEL     Status: Abnormal   Collection Time    10/04/12  2:43 AM      Result Value Range   Salicylate Lvl <2.0 (*) 2.8 - 20.0 mg/dL  ETHANOL     Status: Abnormal   Collection Time    10/04/12  2:43 AM      Result Value Range   Alcohol, Ethyl (B) 244 (*) 0 - 11 mg/dL   Comment:            LOWEST DETECTABLE LIMIT FOR     SERUM ALCOHOL IS 11 mg/dL     FOR MEDICAL PURPOSES ONLY  POCT I-STAT, CHEM 8     Status: Abnormal   Collection Time    10/04/12  2:51 AM      Result Value Range   Sodium 146 (*) 135 - 145 mEq/L   Potassium 3.6  3.5 - 5.1 mEq/L   Chloride 110  96 - 112 mEq/L   BUN 7  6 - 23 mg/dL   Creatinine, Ser 1.61  0.50 - 1.35 mg/dL   Glucose, Bld 95  70 - 99 mg/dL   Calcium, Ion 0.96 (*) 1.12 - 1.23 mmol/L   TCO2 22  0 - 100 mmol/L   Hemoglobin 13.9  13.0 - 17.0 g/dL   HCT 04.5  40.9 - 81.1 %  URINALYSIS, ROUTINE W REFLEX MICROSCOPIC     Status: None   Collection Time    10/04/12  3:20 AM      Result Value Range   Color, Urine YELLOW  YELLOW   APPearance CLEAR  CLEAR    Specific Gravity, Urine 1.018  1.005 - 1.030   pH 5.5  5.0 - 8.0   Glucose, UA NEGATIVE  NEGATIVE mg/dL   Hgb urine dipstick NEGATIVE  NEGATIVE   Bilirubin Urine NEGATIVE  NEGATIVE   Ketones, ur NEGATIVE  NEGATIVE mg/dL   Protein, ur NEGATIVE  NEGATIVE mg/dL   Urobilinogen, UA 1.0  0.0 - 1.0 mg/dL   Nitrite NEGATIVE  NEGATIVE   Leukocytes, UA NEGATIVE  NEGATIVE   Comment: MICROSCOPIC NOT DONE ON URINES WITH NEGATIVE PROTEIN, BLOOD, LEUKOCYTES, NITRITE, OR GLUCOSE <1000 mg/dL.  URINE RAPID DRUG SCREEN (HOSP PERFORMED)     Status: Abnormal   Collection Time    10/04/12  3:20 AM      Result Value Range   Opiates POSITIVE (*) NONE DETECTED   Cocaine NONE DETECTED  NONE DETECTED   Benzodiazepines NONE DETECTED  NONE DETECTED   Amphetamines NONE DETECTED  NONE DETECTED   Tetrahydrocannabinol NONE DETECTED  NONE DETECTED   Barbiturates NONE DETECTED  NONE DETECTED   Comment:            DRUG SCREEN FOR MEDICAL PURPOSES     ONLY.  IF CONFIRMATION IS NEEDED     FOR ANY PURPOSE, NOTIFY LAB     WITHIN 5 DAYS.                LOWEST DETECTABLE LIMITS     FOR URINE DRUG SCREEN     Drug Class       Cutoff (ng/mL)     Amphetamine      1000     Barbiturate      200     Benzodiazepine   200     Tricyclics       300     Opiates          300     Cocaine          300     THC              50  ACETAMINOPHEN LEVEL     Status: None   Collection Time    10/04/12  7:10 AM      Result Value Range   Acetaminophen (Tylenol), Serum <15.0  10 - 30 ug/mL   Comment:            THERAPEUTIC CONCENTRATIONS VARY     SIGNIFICANTLY. A RANGE OF 10-30     ug/mL MAY BE AN EFFECTIVE     CONCENTRATION FOR MANY PATIENTS.     HOWEVER, SOME ARE BEST TREATED     AT CONCENTRATIONS OUTSIDE THIS     RANGE.     ACETAMINOPHEN CONCENTRATIONS     >150 ug/mL AT 4 HOURS AFTER  INGESTION AND >50 ug/mL AT 12     HOURS AFTER INGESTION ARE     OFTEN ASSOCIATED WITH TOXIC     REACTIONS.    Ct Head Wo  Contrast  10/04/2012  *RADIOLOGY REPORT*  Clinical Data: Unresponsive; took multiple medications.  CT HEAD WITHOUT CONTRAST  Technique:  Contiguous axial images were obtained from the base of the skull through the vertex without contrast.  Comparison: CT of the head performed 12/15 1011  Findings: There is no evidence of acute infarction, mass lesion, or intra- or extra-axial hemorrhage on CT.  Prominence of the sulci suggests mild cortical volume loss.  Mild cerebellar atrophy is noted.  The brainstem and fourth ventricle are within normal limits.  The basal ganglia are unremarkable in appearance.  The cerebral hemispheres demonstrate grossly normal gray-white differentiation. No mass effect or midline shift is seen.  There is no evidence of fracture; visualized osseous structures are unremarkable in appearance.  The visualized portions of the orbits are within normal limits.  The paranasal sinuses and mastoid air cells are well-aerated.  No significant soft tissue abnormalities are seen.  IMPRESSION:  1.  No acute intracranial pathology seen on CT. 2.  Mild cortical volume loss noted.   Original Report Authenticated By: Tonia Ghent, M.D.     Positive for anxiety, bad mood, behavior problems, excessive alcohol consumption and illegal drug usage Blood pressure 143/99, pulse 66, temperature 98.8 F (37.1 C), temperature source Oral, resp. rate 18, SpO2 99.00%.   Assessment/Plan:  Alcohol intoxication Alcohol dependence history of seizure disorder History of hepatitis B and C.   Recommendation: Patient benefit from the PICU psychiatric hospitalization from the detox treatment with the Librium protocol and possibly clonidine and appropriate medication management for crisis stabilization. Continue home medications. Patient will be admitted to 300 hall and case discussed with Verne Spurr, NP.    Darrol Jump R. 10/04/2012, 3:57 PM

## 2012-10-04 NOTE — ED Notes (Signed)
Attempted to call reports nurse unavailable at this time.

## 2012-10-04 NOTE — ED Notes (Signed)
No acute distress noted.

## 2012-10-04 NOTE — ED Provider Notes (Signed)
Antonio [name], ACT will evaluate patient.   Ward Givens, MD 10/04/12 [phone]

## 2012-10-04 NOTE — Progress Notes (Signed)
[facility] ED CM consulted by [facility] ED SW to assist with resources for dental care to assist with [facility] trigger of dental pain.   CM reviewed EPIC chart review CM reviewed appropriate resources Noted pt was provided [facility] medication assistance last on 02/26/12 at [facility]  CM spoke with pt who confirms self pay [facility] resident with no pcp.CM spoke with pt  about [place] self pay resources for pcp, dental services, DSS, [facility] pcps and medication assistance, health department, financial resources, written information for importance of pcp for f/u care, www.needymeds.org, and discounted pharmacies. Reviewed resources for [place] self pay pcps like [facility], family medicine at [address], [facility] family practice, general medical clinics, [name] urgent care plus others, [facility] out patient [facility]ies, housing and other resources in [employer]. Pt voiced understanding and appreciation of resources provided  Pt agreed to receive these written resources upon on the day of d/c along with d/c instructions. BH RN informed and CM requested pt be given written information on the day of d/c   Included following information: If unable to pay or uninsured, contact: [facility]. to become qualified for the adult dental clinic. [facility] and [facility] both have dental clinics completed by students [facility] [phone] ext [phone] --[facility] [phone] [place] [address] [phone] approx $200 (paid up front) [facility] dental clinic [address] [place] [address] approx [phone]  [place] [facility] - They run a dental assistance program that is organized by [employer] to provide dental services and aid to [employer]. Services offered by the dental clinic are limited to extractions, pain management, and minor restorative care. [phone] DENTAL SERVICES UNINSURED/NO INSURANCE COVERAGE OR WITH INSURANCE hCALL 1 [phone]  As left Pt's room, CM noted  male visitor awaiting to see pt.  CM later returned to review the above information with Para March, male visitor and pt's sponsor. During this conversation with Para March, the pt excused himself to go to the restroom.  She reports assisting pt for 4 years after finding him under a bridge in [place].  Pt stays with her and her 2 dogs Para March does have a daughter but she not involved in pt's care.  Para March reports pt has family in [place] that only speak spanish and Para March is not fluent in spanish.  Reports pt is connected with [facility] and had been on medications that she feels was helping pt. She reports the pt states medicine was not helping him and he therefore refused to go back to [facility] for follow up. She confirms she assisted pt to sign up for disability that is pending (pt has a Product/process development scientist) She states pt was seen by a Dentist, Dr Excell Seltzer.  Two prior teeth removed.  Para March paid for these services.  She reports pt has another appointment with Dr Excell Seltzer but began hearing voices and needed this ED [facility] visit completed before his dental appointment.  Encouraged uses of [facility], needymeds.org, self pay pcps, dental resources and financial resources. Para March voiced understanding and appreciation for services and resources offered

## 2012-10-04 NOTE — Progress Notes (Signed)
Vol admit to the 300 hall for detox from alcohol.  Pt is also s/p overdose of opiates bought off the street and alcohol.  Pt reports he was just tired of the tooth pain he is experiencing and wanted it to stop.  He has had two teeth pulled recently, but needs some more pulled.  He has an appt scheduled with a dentist, but can't remember when.  He says his "wife" made the appt.   He has been on suboxone before, but could not get any more and started using heroin.  He has hx splenectomy d/t ruptured spleen, chronic pain from past work injuries, Hep B and Hep C.  He denies SI/HI/AV now, but when he was at the ED, he stated he was hearing and seeing things along with having suicidal thoughts.  Pt was tearful during the admission d/t the tooth pain he was experiencing.  He was fidgety, but cooperative with the skin search and admission process.  Pt was hypertensive on admission, but feel it was related to his pain level.  Pt will be started on the Librium protocol.  Will inform PA on the unit of patient's tooth pain.  Pt was given a meal and beverage.  Safety maintained with q15 minute checks.

## 2012-10-04 NOTE — Progress Notes (Signed)
Pt referred to [facility] pending review.   Catha Gosselin, LCSWA  [phone] 10/04/2012 1456pm

## 2012-10-04 NOTE — ED Provider Notes (Signed)
Patient has history of seizure disorder. He was brought in by EMS after taking an overdose. He states he took 2 many pills that he got from other people "to kill myself". Patient turned over to me at change of shift to check on his Depakote level, which is subtherapeutic.  IV Depacon was ordered. Patient also getting four-hour Tylenol level. At which point he hopefully will be able to go back to the psych emergency department.  Ward Givens, MD 10/04/12 [phone]

## 2012-10-05 ENCOUNTER — Encounter (HOSPITAL_COMMUNITY): Payer: Self-pay | Admitting: Psychiatry

## 2012-10-05 DIAGNOSIS — F1010 Alcohol abuse, uncomplicated: Secondary | ICD-10-CM

## 2012-10-05 DIAGNOSIS — F1020 Alcohol dependence, uncomplicated: Principal | ICD-10-CM

## 2012-10-05 DIAGNOSIS — F10239 Alcohol dependence with withdrawal, unspecified: Secondary | ICD-10-CM

## 2012-10-05 MED ORDER — GABAPENTIN 300 MG PO CAPS
300.0000 mg | ORAL_CAPSULE | Freq: Three times a day (TID) | ORAL | Status: DC
  Administered 2012-10-05 – 2012-10-11 (×19): 300 mg via ORAL
  Filled 2012-10-05: qty 1
  Filled 2012-10-05: qty 42
  Filled 2012-10-05 (×3): qty 1
  Filled 2012-10-05: qty 42
  Filled 2012-10-05 (×8): qty 1
  Filled 2012-10-05: qty 42
  Filled 2012-10-05 (×9): qty 1

## 2012-10-05 MED ORDER — CEPHALEXIN 250 MG PO CAPS: 250.0000 mg | ORAL_CAPSULE | Freq: Two times a day (BID) | ORAL | Status: DC

## 2012-10-05 MED ORDER — BENZOCAINE 10 % MT GEL
Freq: Four times a day (QID) | OROMUCOSAL | Status: DC | PRN
  Administered 2012-10-05: 12:00:00 via OROMUCOSAL
  Filled 2012-10-05: qty 9.4

## 2012-10-05 MED ORDER — DOXYCYCLINE HYCLATE 100 MG PO TABS
100.0000 mg | ORAL_TABLET | Freq: Two times a day (BID) | ORAL | Status: DC
  Administered 2012-10-05 – 2012-10-11 (×12): 100 mg via ORAL
  Filled 2012-10-05 (×14): qty 1

## 2012-10-05 MED ORDER — IBUPROFEN 600 MG PO TABS
600.0000 mg | ORAL_TABLET | Freq: Four times a day (QID) | ORAL | Status: DC | PRN
  Administered 2012-10-05 – 2012-10-11 (×11): 600 mg via ORAL
  Filled 2012-10-05 (×11): qty 1

## 2012-10-05 NOTE — Progress Notes (Signed)
Recreation Therapy Notes  Date: 04.15.2014 Time: 3:00pm Location: 300 Hall Day Room      Group Topic/Focus: Decision Making & Goal Setting  Participation Level: Active  Participation Quality: Appropriate  Affect: Euthymic  Cognitive: Appropriate   Additional Comments: Activity: Choices in a Jar & [employer]; Explanation: Choices in a Jar - Patients were asked a series of either or questions from Choices in a Jar. Patients were asked to choice one option out of the two provided. [place] - A beach ball was placed between patient and peer or LRT hips. Patients were asked to set a goal of how far they could walk without the ball falling to the floor. Patients were instructed not to touch the ball or each other to complete this task.   Patient actively participated in Choices in a Jar. Patient participated in group discussion about making good choices. Patient actively participated in [employer]. Patient with peer set goal of making it to the end of 300 hallway and back. Patient and peer successful at reaching this goal. Patient participated in discussion about goal setting effecting sobriety.   Antonio [name], LRT/CTRS  Jearl Klinefelter 10/05/2012 3:42 PM

## 2012-10-05 NOTE — [facility] Suicide Risk Assessment (Signed)
Suicide Risk Assessment  Admission Assessment     Nursing information obtained from:  Patient Demographic factors:  Male;Low socioeconomic status;Unemployed Current Mental Status:  NA (Pt denies SI at this time) Loss Factors:  Financial problems / change in socioeconomic status Historical Factors:  Prior suicide attempts;Family history of mental illness or substance abuse Risk Reduction Factors:  Living with another person, especially a relative;Positive social support  CLINICAL FACTORS:   Depression:   Comorbid alcohol abuse/dependence Alcohol/Substance Abuse/Dependencies Medical Diagnoses and Treatments/Surgeries  COGNITIVE FEATURES THAT CONTRIBUTE TO RISK: None iedentified   SUICIDE RISK:   Moderate:  Frequent suicidal ideation with limited intensity, and duration, some specificity in terms of plans, no associated intent, good self-control, limited dysphoria/symptomatology, some risk factors present, and identifiable protective factors, including available and accessible social support.  PLAN OF CARE: Supportive approach/coping skills/relapse prevention                               Detox                               Reassess co morbidities  I certify that inpatient services furnished can reasonably be expected to improve the patient's condition.  Trayven Lumadue A 10/05/2012, 5:31 PM

## 2012-10-05 NOTE — [facility] Group Notes (Signed)
[facility] LCSW Aftercare Discharge Planning Group Note   10/05/2012 8:45 AM  Participation Quality:  Did not attend; patient in bed complaining of toothache  Harrill, Julious Payer

## 2012-10-05 NOTE — Progress Notes (Signed)
Pt was up for breakfast and came to medication window for his morning medication.  He immediately asked "has my antibiotic here yet" informed pt that the doctor would be in this morning and he can talk with him about his need for antibiotic or pain medication.  Pt did voice understanding.  He reported poor sleep poor appetite energy level low and ability to pay attention as poor.  He adamantly denies any depression hopelessness or anxiety.  He denied any S/H ideation but did admit to hearing voices that say "come here" then he hears them say "whoa whoa".  He is unsure where he plans to go from here.  As the day has progressed, pt has been up awake and interacting with peers and staff appropriately.  He did go down for lunch and he has been ordered ibuprofen, gel (which he refused after first dose) stated,"it makes it hurt worse" he also started on gabapentin and an antibiotic which pt is aware and voiced understanding.  He did c/o diarrhea and was given imodium which was helpful.

## 2012-10-05 NOTE — [facility] Group Notes (Signed)
[facility] LCSW Group Therapy  10/05/2012 1:15 PM  Type of Therapy:  Group Therapy 1:15 to 2:30PM  Participation Level:  Minimal  Participation Quality:  Attentive  Affect:  Distracted; in and out of group room  Cognitive:  Alert and Oriented  Insight:  None shared  Engagement in Therapy:  Limited  Modes of Intervention:  Discussion, Education and Support  Summary of Progress/Problems: Patient attended group presentation by staff member of  [facility] ([facility]). [name] was somewhat attentive during session.   Antonio [name]

## 2012-10-05 NOTE — H&P (Signed)
Psychiatric Admission Assessment Adult  Patient Identification:  Antonio [name]  Date of Evaluation:  10/05/2012  Chief Complaint:  MDD with psychotic features   Alcohol Abuse  History of Present Illness: This is a 42 year old Hispanic male. Admitted to [facility] from the [facility] Ed with complaints of suicide attempt by overdose on Aspirin and alcohol. Patient reports, "I went to the [facility] yesterday because I took too many Aspirin Pills and drank alot of alcohol too. Prior to doing this, I was having a bad tooth pain. I was trying to get my pain out. But, what happened was I passed out and my friend found me and called 911. The ambulance brought me to the hospital. My tooth pain started last Sunday, then it got worse. I did not know what to do at the time. I was in pain. I'm still in pain. The right side of my face is hurting a lot. I have throbbing headaches.  I don't have drug problems, but I do have alcohol problems. I started drinking at the age of 22. I drink about 12 packs daily. I have had DUIs in the past, none presently. I have also been to a treatment program in the past too. The longest that I have been sober is 1 year".  Elements:  Location:  [facility] adult unit. Quality:  pain, frustration, angry. Severity:  Severe "It was bad, the worst tooth pain I have ever felt". Timing:  "It started Sunday morning". Duration:  "It lasted all day Sunday, did not give me any break, bad pain". Context:  "I could rest, eat or sleep, can't focus, even my face hurts, my head throbbing".  Associated Signs/Synptoms:  Depression Symptoms:  suicidal thoughts with specific plan, suicidal attempt,  (Hypo) Manic Symptoms:  Irritable Mood,  Anxiety Symptoms:  Excessive Worry,  Psychotic Symptoms:  Hallucinations: Auditory Olfactory  PTSD Symptoms: Had a traumatic exposure:  None reported  Psychiatric Specialty Exam: Physical Exam  Constitutional: He is oriented to person,  place, and time. He appears well-developed.  HENT:  Head: Normocephalic.  Eyes: Pupils are equal, round, and reactive to light.  Neck: Normal range of motion.  Cardiovascular: Normal rate.   Respiratory: Effort normal.  GI: Soft.  Musculoskeletal: Normal range of motion.  Neurological: He is alert and oriented to person, place, and time.  Skin: Skin is warm and dry.  Psychiatric: His speech is normal. His affect is angry. He is agitated and actively hallucinating. Thought content is not delusional. Cognition and memory are normal. He expresses impulsivity and inappropriate judgment. He exhibits a depressed mood. He expresses no homicidal and no suicidal ideation. He expresses no suicidal plans and no homicidal plans.    Review of Systems  Constitutional: Negative.   Eyes: Negative.   Respiratory: Negative.   Cardiovascular: Negative.   Gastrointestinal: Negative.   Genitourinary: Negative.   Musculoskeletal: Positive for myalgias.  Skin: Positive for itching and rash.  Neurological: Positive for tremors and headaches.  Endo/Heme/Allergies: Negative.   Psychiatric/Behavioral: Positive for depression, hallucinations and substance abuse (Alcohol and benzodiazepine). Negative for suicidal ideas and memory loss. The patient is nervous/anxious. The patient does not have insomnia.     Blood pressure 136/85, pulse 99, temperature 98.4 F (36.9 C), temperature source Oral, resp. rate 20, height 5\' 8"  (1.727 m), weight 63.05 kg (139 lb).Body mass index is 21.14 kg/(m^2).  General Appearance: Disheveled  Eye Contact::  Fair  Speech:  Slow and has some difficulty with  spoken english language  Volume:  Normal  Mood:  Angry, Anxious and Irritable  Affect:  Flat and Tearful  Thought Process:  Goal Directed and Intact  Orientation:  Full (Time, Place, and Person)  Thought Content:  Hallucinations: Auditory Visual and Rumination  Suicidal Thoughts:  No  Homicidal Thoughts:  No  Memory:   Immediate;   Fair Recent;   Fair Remote;   Fair  Judgement:  Impaired  Insight:  Lacking  Psychomotor Activity:  Restlessness  Concentration:  Poor  Recall:  Fair  Akathisia:  No  Handed:  Right  AIMS (if indicated):     Assets:  Desire for Improvement  Sleep:  Number of Hours: 6.25    Past Psychiatric History: Diagnosis: Suicide attempt by overdose, Alcohol dependence, Alcohol abuse   Hospitalizations: [facility] x 3  Outpatient Care:   Substance Abuse Care: Unable to remember  Self-Mutilation: Denies  Suicidal Attempts: "Yes by overdose/drinking bleach)  Violent Behaviors: "Yes, I drank bleach one time to kill myself"   Past Medical History:   Past Medical History  Diagnosis Date  . Hepatitis     hep b and c  . Ruptured spleen   . Pain in thoracic spine   . Chronic pain syndrome   . Depression    Seizure History:  Alcohol withdrawal seizures  Allergies:   Allergies  Allergen Reactions  . Sulfa Antibiotics Swelling   PTA Medications: Prescriptions prior to admission  Medication Sig Dispense Refill  . divalproex (DEPAKOTE ER) 500 MG 24 hr tablet Take 1 tablet (500 mg total) by mouth at bedtime.  30 tablet  0  . folic acid (FOLVITE) 1 MG tablet Take 1 tablet (1 mg total) by mouth daily.  30 tablet  0  . thiamine 100 MG tablet Take 1 tablet (100 mg total) by mouth daily.  30 tablet  0    Previous Psychotropic Medications:  Medication/Dose  See medication lists               Substance Abuse History in the last 12 months:  yes  Consequences of Substance Abuse: Medical Consequences:  Liver damage, Possible death by overdose Legal Consequences:  Arrests, jail time, Loss of driving privilege. Family Consequences:  Family discord, divorce and or separation.  Social History:  reports that he has been smoking Cigarettes.  He has a 15 pack-year smoking history. He does not have any smokeless tobacco history on file. He reports that he drinks about 50.4 ounces of  alcohol per week. He reports that he uses illicit drugs (Hydrocodone and Heroin) about 28 times per week. Additional Social History: Pain Medications: See home med list Prescriptions: See home med list Over the Counter: See home med list History of alcohol / drug use?: Yes Longest period of sobriety (when/how long): not sure Negative Consequences of Use: Financial;Personal relationships Name of Substance 1: heroin 1 - Age of First Use: teens 1 - Amount (size/oz): unknown 1 - Last Use / Amount: can't remember Name of Substance 2: opiates 2 - Age of First Use: unsure 2 - Last Use / Amount: 4/12  Current Place of Residence: [place], [place]    Place of Birth: [place]  Family Members: "My friend"  Marital Status:  Single  Children: 6  Sons: 3  Daughters: 3  Relationships: Single  Education:  No high school diploma  Educational Problems/Performance: Did not complete high school  Religious Beliefs/Practices: NA  History of Abuse (Emotional/Phsycial/Sexual): None reported  Occupational Experiences: Unemployed  Military History:  None.  Legal History: Hx. DUIs, none pending  Hobbies/Interests: None reported  Family History:  History reviewed. No pertinent family history.  Results for orders placed during the hospital encounter of 10/04/12 (from the past 72 hour(s))  VALPROIC ACID LEVEL     Status: Abnormal   Collection Time    10/04/12  2:43 AM      Result Value Range   Valproic Acid Lvl <10.0 (*) 50.0 - 100.0 ug/mL  CBC     Status: Abnormal   Collection Time    10/04/12  2:43 AM      Result Value Range   WBC 7.7  4.0 - 10.5 K/uL   RBC 4.06 (*) 4.22 - 5.81 MIL/uL   Hemoglobin 12.8 (*) 13.0 - 17.0 g/dL   HCT 16.1 (*) 09.6 - 04.5 %   MCV 92.6  78.0 - 100.0 fL   MCH 31.5  26.0 - 34.0 pg   MCHC 34.0  30.0 - 36.0 g/dL   RDW 40.9  81.1 - 91.4 %   Platelets 77 (*) 150 - 400 K/uL   Comment: REPEATED TO VERIFY     SPECIMEN CHECKED FOR CLOTS     PLATELET COUNT  CONFIRMED BY SMEAR  COMPREHENSIVE METABOLIC PANEL     Status: Abnormal   Collection Time    10/04/12  2:43 AM      Result Value Range   Sodium 141  135 - 145 mEq/L   Potassium 3.5  3.5 - 5.1 mEq/L   Chloride 106  96 - 112 mEq/L   CO2 23  19 - 32 mEq/L   Glucose, Bld 92  70 - 99 mg/dL   BUN 8  6 - 23 mg/dL   Creatinine, Ser 7.82  0.50 - 1.35 mg/dL   Calcium 8.5  8.4 - 95.6 mg/dL   Total Protein 7.7  6.0 - 8.3 g/dL   Albumin 3.4 (*) 3.5 - 5.2 g/dL   AST 213 (*) 0 - 37 U/L   ALT 103 (*) 0 - 53 U/L   Alkaline Phosphatase 96  39 - 117 U/L   Total Bilirubin 0.7  0.3 - 1.2 mg/dL   GFR calc non Af Amer >90  >90 mL/min   GFR calc Af Amer >90  >90 mL/min   Comment:            The eGFR has been calculated     using the CKD EPI equation.     This calculation has not been     validated in all clinical     situations.     eGFR's persistently     <90 mL/min signify     possible Chronic Kidney Disease.  ACETAMINOPHEN LEVEL     Status: None   Collection Time    10/04/12  2:43 AM      Result Value Range   Acetaminophen (Tylenol), Serum <15.0  10 - 30 ug/mL   Comment:            THERAPEUTIC CONCENTRATIONS VARY     SIGNIFICANTLY. A RANGE OF 10-30     ug/mL MAY BE AN EFFECTIVE     CONCENTRATION FOR MANY PATIENTS.     HOWEVER, SOME ARE BEST TREATED     AT CONCENTRATIONS OUTSIDE THIS     RANGE.     ACETAMINOPHEN CONCENTRATIONS     >150 ug/mL AT 4 HOURS AFTER     INGESTION AND >50 ug/mL AT 12     HOURS  AFTER INGESTION ARE     OFTEN ASSOCIATED WITH TOXIC     REACTIONS.  SALICYLATE LEVEL     Status: Abnormal   Collection Time    10/04/12  2:43 AM      Result Value Range   Salicylate Lvl <2.0 (*) 2.8 - 20.0 mg/dL  ETHANOL     Status: Abnormal   Collection Time    10/04/12  2:43 AM      Result Value Range   Alcohol, Ethyl (B) 244 (*) 0 - 11 mg/dL   Comment:            LOWEST DETECTABLE LIMIT FOR     SERUM ALCOHOL IS 11 mg/dL     FOR MEDICAL PURPOSES ONLY  POCT I-STAT, CHEM 8      Status: Abnormal   Collection Time    10/04/12  2:51 AM      Result Value Range   Sodium 146 (*) 135 - 145 mEq/L   Potassium 3.6  3.5 - 5.1 mEq/L   Chloride 110  96 - 112 mEq/L   BUN 7  6 - 23 mg/dL   Creatinine, Ser 1.47  0.50 - 1.35 mg/dL   Glucose, Bld 95  70 - 99 mg/dL   Calcium, Ion 8.29 (*) 1.12 - 1.23 mmol/L   TCO2 22  0 - 100 mmol/L   Hemoglobin 13.9  13.0 - 17.0 g/dL   HCT 56.2  13.0 - 86.5 %  URINALYSIS, ROUTINE W REFLEX MICROSCOPIC     Status: None   Collection Time    10/04/12  3:20 AM      Result Value Range   Color, Urine YELLOW  YELLOW   APPearance CLEAR  CLEAR   Specific Gravity, Urine 1.018  1.005 - 1.030   pH 5.5  5.0 - 8.0   Glucose, UA NEGATIVE  NEGATIVE mg/dL   Hgb urine dipstick NEGATIVE  NEGATIVE   Bilirubin Urine NEGATIVE  NEGATIVE   Ketones, ur NEGATIVE  NEGATIVE mg/dL   Protein, ur NEGATIVE  NEGATIVE mg/dL   Urobilinogen, UA 1.0  0.0 - 1.0 mg/dL   Nitrite NEGATIVE  NEGATIVE   Leukocytes, UA NEGATIVE  NEGATIVE   Comment: MICROSCOPIC NOT DONE ON URINES WITH NEGATIVE PROTEIN, BLOOD, LEUKOCYTES, NITRITE, OR GLUCOSE <1000 mg/dL.  URINE RAPID DRUG SCREEN (HOSP PERFORMED)     Status: Abnormal   Collection Time    10/04/12  3:20 AM      Result Value Range   Opiates POSITIVE (*) NONE DETECTED   Cocaine NONE DETECTED  NONE DETECTED   Benzodiazepines NONE DETECTED  NONE DETECTED   Amphetamines NONE DETECTED  NONE DETECTED   Tetrahydrocannabinol NONE DETECTED  NONE DETECTED   Barbiturates NONE DETECTED  NONE DETECTED   Comment:            DRUG SCREEN FOR MEDICAL PURPOSES     ONLY.  IF CONFIRMATION IS NEEDED     FOR ANY PURPOSE, NOTIFY LAB     WITHIN 5 DAYS.                LOWEST DETECTABLE LIMITS     FOR URINE DRUG SCREEN     Drug Class       Cutoff (ng/mL)     Amphetamine      1000     Barbiturate      200     Benzodiazepine   200     Tricyclics       300  Opiates          300     Cocaine          300     THC              50  ACETAMINOPHEN  LEVEL     Status: None   Collection Time    10/04/12  7:10 AM      Result Value Range   Acetaminophen (Tylenol), Serum <15.0  10 - 30 ug/mL   Comment:            THERAPEUTIC CONCENTRATIONS VARY     SIGNIFICANTLY. A RANGE OF 10-30     ug/mL MAY BE AN EFFECTIVE     CONCENTRATION FOR MANY PATIENTS.     HOWEVER, SOME ARE BEST TREATED     AT CONCENTRATIONS OUTSIDE THIS     RANGE.     ACETAMINOPHEN CONCENTRATIONS     >150 ug/mL AT 4 HOURS AFTER     INGESTION AND >50 ug/mL AT 12     HOURS AFTER INGESTION ARE     OFTEN ASSOCIATED WITH TOXIC     REACTIONS.   Psychological Evaluations:  Assessment:   AXIS I:  Alcohol dependence, Alcohol abuse with withdrawal,  AXIS II:  Deferred AXIS III:   Past Medical History  Diagnosis Date  . Hepatitis     hep b and c  . Ruptured spleen   . Pain in thoracic spine   . Chronic pain syndrome   . Depression    AXIS IV:  economic problems, educational problems, occupational problems and other psychosocial or environmental problems AXIS V:  11-20 some danger of hurting self or others possible OR occasionally fails to maintain minimal personal hygiene OR gross impairment in communication  Treatment Plan/Recommendations: 1. Admit for crisis management and stabilization, estimated length of stay 3-5 days.  2. Medication management to reduce current symptoms to base line and improve the patient's overall level of functioning  3. Treat health problems as indicated. (a). Motrin 600 mg Q 6 hours for pain                                                               (b). Doxycycline 100 mg bid for tooth infections.                                                               (c). Orajel 10% prn for tooth pain.                                                               (d). Obtain Depakote levels. 4. Develop treatment plan to decrease risk of relapse upon discharge and the need for readmission.  5. Psycho-social education regarding relapse prevention and  self care.  6. Health care follow up as needed for medical problems.  7.  Review, reconcile, and reinstate any pertinent home medications for other health issues where appropriate. 8. Call for consults with hospitalist for any additional specialty patient care services as needed.  Treatment Plan Summary: Daily contact with patient to assess and evaluate symptoms and progress in treatment Medication management Supportive approach/coping skills/relapse prevention Librium detox protocol Reassess and address the co morbidites Current Medications:  Current Facility-Administered Medications  Medication Dose Route Frequency Provider Last Rate Last Dose  . acetaminophen (TYLENOL) tablet 650 mg  650 mg Oral Q6H PRN Verne Spurr, PA-C   650 mg at 10/05/12 0543  . alum & mag hydroxide-simeth (MAALOX/MYLANTA) 200-200-20 MG/5ML suspension 30 mL  30 mL Oral Q4H PRN Verne Spurr, PA-C      . chlordiazePOXIDE (LIBRIUM) capsule 25 mg  25 mg Oral Q6H PRN Verne Spurr, PA-C      . chlordiazePOXIDE (LIBRIUM) capsule 25 mg  25 mg Oral QID Verne Spurr, PA-C   25 mg at 10/05/12 0749   Followed by  . [START ON 10/06/2012] chlordiazePOXIDE (LIBRIUM) capsule 25 mg  25 mg Oral TID Verne Spurr, PA-C       Followed by  . [START ON 10/07/2012] chlordiazePOXIDE (LIBRIUM) capsule 25 mg  25 mg Oral BH-qamhs Verne Spurr, PA-C       Followed by  . [START ON 10/09/2012] chlordiazePOXIDE (LIBRIUM) capsule 25 mg  25 mg Oral Daily Verne Spurr, PA-C      . divalproex (DEPAKOTE ER) 24 hr tablet 1,000 mg  1,000 mg Oral QHS Kerry Hough, PA-C   1,000 mg at 10/04/12 2222  . hydrOXYzine (ATARAX/VISTARIL) tablet 25 mg  25 mg Oral Q6H PRN Verne Spurr, PA-C   25 mg at 10/05/12 0543  . loperamide (IMODIUM) capsule 2-4 mg  2-4 mg Oral PRN Verne Spurr, PA-C      . magnesium hydroxide (MILK OF MAGNESIA) suspension 30 mL  30 mL Oral Daily PRN Verne Spurr, PA-C      . multivitamin with minerals tablet 1 tablet  1 tablet Oral  Daily Verne Spurr, PA-C   1 tablet at 10/05/12 0749  . nicotine (NICODERM CQ - dosed in mg/24 hours) patch 21 mg  21 mg Transdermal Q0600 Kerry Hough, PA-C   21 mg at 10/05/12 0545  . ondansetron (ZOFRAN-ODT) disintegrating tablet 4 mg  4 mg Oral Q6H PRN Verne Spurr, PA-C      . thiamine (VITAMIN B-1) tablet 100 mg  100 mg Oral Daily Verne Spurr, PA-C   100 mg at 10/05/12 0749    Observation Level/Precautions:  15 minute checks  Laboratory:  Reviewed ED lab findings on file  Psychotherapy:  Group sessions  Medications:  See medication lists  Consultations:  As needed  Discharge Concerns:  Safety/sobriety  Estimated LOS: 3-5 days  Other:     I certify that inpatient services furnished can reasonably be expected to improve the patient's condition.   Armandina Stammer I 4/15/201410:14 AM

## 2012-10-05 NOTE — Progress Notes (Signed)
Pt observed in the dayroom watching TV with peers.  Pt appears more at ease this evening than he did at admission last night.  He is smiling, saying he feels better.  His significant other brought him His tooth pain has decreased, and he started an antibiotic today for his dental caries.  He says his alcohol withdrawal symptoms are minimal at this point.  He denies SI/HI/AV.  He is unsure what he will do at discharge as far as any additional treatment for his addictions.  He wants to take care of his dental needs as soon as possible.  He is pleasant/cooperative.  Pt makes his needs known to staff.  Support/encouragement offered.  Safety maintained with q15 minute checks.

## 2012-10-06 DIAGNOSIS — F39 Unspecified mood [affective] disorder: Secondary | ICD-10-CM | POA: Diagnosis present

## 2012-10-06 DIAGNOSIS — F1020 Alcohol dependence, uncomplicated: Secondary | ICD-10-CM | POA: Diagnosis present

## 2012-10-06 DIAGNOSIS — F1120 Opioid dependence, uncomplicated: Secondary | ICD-10-CM | POA: Diagnosis present

## 2012-10-06 NOTE — [facility] Group Notes (Signed)
[facility] LCSW Aftercare Discharge Planning Group Note   10/06/2012 8:45 AM  Participation Quality:  Did Not Attend, Patient in bed     Royann Wildasin, Julious Payer

## 2012-10-06 NOTE — Progress Notes (Signed)
[facility] MD Progress Note  10/06/2012 4:12 PM Antonio [name]  MRN:  [id number] Subjective:  Antonio [name] endorses that he continues to have a hard time. He is not receptive to the idea of going to another treatment program past here. He is dealing with tooth pain. States he could not sleep that well due to the same. He understands that with all the medical problems that he has he needs to quit for good, but still hesitant to go to rehab. Diagnosis:  Opioid Dependence, Alcohol Dependence, Mood Disorder NOS  ADL's:  Intact  Sleep: Poor  Appetite:  Poor  Suicidal Ideation:  Plan:  denies Intent:  denies Means:  denies Homicidal Ideation:  Plan:  denies Intent:  denies Means:  denies AEB (as evidenced by):  Psychiatric Specialty Exam: Review of Systems  Constitutional: Negative.   HENT: Negative.   Eyes: Negative.   Respiratory: Negative.   Cardiovascular: Negative.   Gastrointestinal: Negative.   Genitourinary: Negative.   Musculoskeletal: Positive for back pain.  Skin: Negative.   Neurological: Negative.   Endo/Heme/Allergies: Negative.   Psychiatric/Behavioral: Positive for substance abuse. The patient is nervous/anxious.     Blood pressure 116/83, pulse 92, temperature 97.9 F (36.6 C), temperature source Oral, resp. rate 16, height 5\' 8"  (1.727 m), weight 63.05 kg (139 lb).Body mass index is 21.14 kg/(m^2).  General Appearance: Disheveled  Eye Solicitor::  Fair  Speech:  Clear and Coherent and Slow  Volume:  fluctuates  Mood:  Anxious, Dysphoric and Irritable  Affect:  Labile  Thought Process:  Coherent and Goal Directed  Orientation:  Full (Time, Place, and Person)  Thought Content:  worries, concerns  Suicidal Thoughts:  No  Homicidal Thoughts:  No  Memory:  Immediate;   Fair Recent;   Fair Remote;   Fair  Judgement:  Fair  Insight:  Shallow  Psychomotor Activity:  Restlessness  Concentration:  Fair  Recall:  Fair  Akathisia:  No  Handed:  Right  AIMS (if  indicated):     Assets:  Social Support  Sleep:  Number of Hours: 6.25   Current Medications: Current Facility-Administered Medications  Medication Dose Route Frequency Provider Last Rate Last Dose  . acetaminophen (TYLENOL) tablet 650 mg  650 mg Oral Q6H PRN Verne Spurr, PA-C   650 mg at 10/05/12 0543  . alum & mag hydroxide-simeth (MAALOX/MYLANTA) 200-200-20 MG/5ML suspension 30 mL  30 mL Oral Q4H PRN Verne Spurr, PA-C      . benzocaine (ORAJEL) 10 % mucosal gel   Mouth/Throat QID PRN Sanjuana Kava, NP      . chlordiazePOXIDE (LIBRIUM) capsule 25 mg  25 mg Oral Q6H PRN Verne Spurr, PA-C      . chlordiazePOXIDE (LIBRIUM) capsule 25 mg  25 mg Oral TID Verne Spurr, PA-C   25 mg at 10/06/12 1106   Followed by  . [START ON 10/07/2012] chlordiazePOXIDE (LIBRIUM) capsule 25 mg  25 mg Oral BH-qamhs Verne Spurr, PA-C       Followed by  . [START ON 10/09/2012] chlordiazePOXIDE (LIBRIUM) capsule 25 mg  25 mg Oral Daily Verne Spurr, PA-C      . divalproex (DEPAKOTE ER) 24 hr tablet 1,000 mg  1,000 mg Oral QHS Kerry Hough, PA-C   1,000 mg at 10/05/12 2214  . doxycycline (VIBRA-TABS) tablet 100 mg  100 mg Oral Q12H Sanjuana Kava, NP   100 mg at 10/06/12 0756  . gabapentin (NEURONTIN) capsule 300 mg  300 mg Oral TID Reymundo Poll  Dub Mikes, MD   300 mg at 10/06/12 1106  . hydrOXYzine (ATARAX/VISTARIL) tablet 25 mg  25 mg Oral Q6H PRN Verne Spurr, PA-C   25 mg at 10/05/12 2214  . ibuprofen (ADVIL,MOTRIN) tablet 600 mg  600 mg Oral Q6H PRN Sanjuana Kava, NP   600 mg at 10/06/12 1027  . loperamide (IMODIUM) capsule 2-4 mg  2-4 mg Oral PRN Verne Spurr, PA-C   4 mg at 10/05/12 1246  . magnesium hydroxide (MILK OF MAGNESIA) suspension 30 mL  30 mL Oral Daily PRN Verne Spurr, PA-C      . multivitamin with minerals tablet 1 tablet  1 tablet Oral Daily Verne Spurr, PA-C   1 tablet at 10/06/12 0757  . nicotine (NICODERM CQ - dosed in mg/24 hours) patch 21 mg  21 mg Transdermal Q0600 Kerry Hough,  PA-C   21 mg at 10/06/12 0640  . ondansetron (ZOFRAN-ODT) disintegrating tablet 4 mg  4 mg Oral Q6H PRN Verne Spurr, PA-C      . thiamine (VITAMIN B-1) tablet 100 mg  100 mg Oral Daily Verne Spurr, PA-C   100 mg at 10/06/12 1610    Lab Results: No results found for this or any previous visit (from the past 48 hour(s)).  Physical Findings: AIMS: Facial and Oral Movements Muscles of Facial Expression: None, normal Lips and Perioral Area: None, normal Jaw: None, normal Tongue: None, normal,Extremity Movements Upper (arms, wrists, hands, fingers): None, normal Lower (legs, knees, ankles, toes): None, normal, Trunk Movements Neck, shoulders, hips: None, normal, Overall Severity Severity of abnormal movements (highest score from questions above): None, normal Incapacitation due to abnormal movements: None, normal Patient's awareness of abnormal movements (rate only patient's report): No Awareness, Dental Status Current problems with teeth and/or dentures?: Yes Does patient usually wear dentures?: No  CIWA:  CIWA-Ar Total: 1 COWS:     Treatment Plan Summary: Daily contact with patient to assess and evaluate symptoms and progress in treatment Medication management  Plan: Supportive approach/coping skills/relapse prevention           Continue and complete the detox           Reassess co morbidites  Medical Decision Making Problem Points:  Review of psycho-social stressors (1) Data Points:  Review of medication regiment & side effects (2)  I certify that inpatient services furnished can reasonably be expected to improve the patient's condition.   Emmali Karow A 10/06/2012, 4:12 PM

## 2012-10-06 NOTE — Progress Notes (Signed)
Adult Psychoeducational Group Note  Date:  10/06/2012 Time:  10:31 PM  Group Topic/Focus:  NA  Participation Level:  Active  Participation Quality:  Appropriate  Affect:  Appropriate  Cognitive:  Alert  Insight: Appropriate  Engagement in Group:  Engaged  Modes of Intervention:  Discussion  Additional Comments:    Flonnie Hailstone 10/06/2012, 10:31 PM

## 2012-10-06 NOTE — B[facility] Counselor (Signed)
Adult Comprehensive Assessment  Patient ID: Antonio [name], male   DOB: [birth year], 42 y.o.   MRN: [id number]  Information Source: Information source: Patient  Current Stressors:  Educational / Learning stressors: 7th grade education Employment / Job issues: Unemployed Family Relationships: NA Surveyor, quantity / Lack of resources (include bankruptcy): Strained, depends on others for support Housing / Lack of housing: NA Physical health (include injuries & life threatening diseases): Toothache, Hep C and B Social relationships: 1 close friend Substance abuse: Ongoing Bereavement / Loss: Grandmother last year  Living/Environment/Situation:  Living Arrangements: Spouse/significant other Living conditions (as described by patient or guardian): Good How long has patient lived in current situation?: 4.5 years What is atmosphere in current home: Comfortable;Loving;Supportive  Family History:  Marital status: Single Does patient have children?: Yes How many children?: 7 How is patient's relationship with their children?: 1 child in [place] and 6 others in [place]; little to no contact with all  Childhood History:  By whom was/is the patient raised?: Both parents Additional childhood history information: Grew Up in [place] Description of patient's relationship with caregiver when they were a child: Good with Both Patient's description of current relationship with people who raised him/her: Good with both, daily contact via phone Does patient have siblings?: Yes Number of Siblings: 10 Description of patient's current relationship with siblings: Good with all "I am the baby and the screw up." Did patient suffer any verbal/emotional/physical/sexual abuse as a child?: No Did patient suffer from severe childhood neglect?: No Has patient ever been sexually abused/assaulted/raped as an adolescent or adult?: No Was the patient ever a victim of a crime or a disaster?: No Witnessed domestic  violence?: No Has patient been effected by domestic violence as an adult?: No  Education:  Highest grade of school patient has completed: 7th Currently a Consulting civil engineer?: No Learning disability?: No  Employment/Work Situation:   Employment situation: Unemployed Patient's job has been impacted by current illness: No What is the longest time patient has a held a job?: "couple of months" Where was the patient employed at that time?: Various places; patient reports he is applying for disability income now Has patient ever been in the [employer]?: No Has patient ever served in Buyer, retail?: No  Financial Resources:   Surveyor, quantity resources: Income from spouse;Food stamps Does patient have a representative payee or guardian?: No  Alcohol/Substance Abuse:   What has been your use of drugs/alcohol within the last 12 months?: Patient acknowledge 12 pack plus of beer daily and is requesting suboxide treatment although no current reported opiate use If attempted suicide, did drugs/alcohol play a role in this?:  (No attempt) Alcohol/Substance Abuse Treatment Hx: Attends AA/NA;Past [place], Outpatient If yes, describe treatment: 6 months clean in AA and several years of suboxone treatment in [place] Has alcohol/substance abuse ever caused legal problems?: Yes (Two DUI's)  Social Support System:   Patient's Community Support System: Good Describe Community Support System: Para March, family and [name] Type of faith/religion: Catholic How does patient's faith help to cope with current illness?: Helps  Leisure/Recreation:   Leisure and Hobbies: Bowling  Strengths/Needs:   What things does the patient do well?: "Cleaning and helping others" In what areas does patient struggle / problems for patient: "Pain"  Discharge Plan:   Does patient have access to transportation?: Yes Will patient be returning to same living situation after discharge?: Yes Currently receiving community mental health services: No If no,  would patient like referral for services when discharged?: Yes (What county?) (  [place]) Does patient have financial barriers related to discharge medications?: Yes Patient description of barriers related to discharge medications: No income  Summary/Recommendations:   Summary and Recommendations (to be completed by the evaluator): Patient is a 42 YO single unemployed [place] male admitted with diagnosis of Major Depression, Recurrent, Severe with psychotic features and Alcohol Abuse.  Patient would benefit from crisis stabilization, medication evaluation, therapy groups for processing thoughts/feelings/experiences, psycho ed groups for coping skills, and case management for discharge planning   Clide Dales. 10/06/2012

## 2012-10-06 NOTE — Progress Notes (Signed)
Pt came to medication window this morning c/o pain in his mouth stated,"I feeling depression because of my mouth" refused any ibuprofen but took his sch meds to include his antibiotic and gabapentin.  He did not want to stay up for group so went back to bed.  He denies any S/H ideation but still hearing "voices" does not elaborate on what he is hearing.  As the day progressed, pt has been up and active in the milieu.  He did get another prn ibuprofen before dinner tonight to see if it will help with the "swelling" he feels.  It did work yesterday but he didn't take at breakfast and finally took around 1027 which seemed to help for the lunch and the afternoon.  Pt wants to have his teeth taking care of before he goes to rehab.

## 2012-10-06 NOTE — Tx Team (Signed)
Interdisciplinary Treatment Plan Update (Adult)  Date: 10/06/2012  Time Reviewed: 9:36 AM   Progress in Treatment: Attending groups: Majority Participating in groups: Majority  Taking medication as prescribed:  Yes Tolerating medication:  Yes Family/Significant othe contact made: Yes Patient understands diagnosis: Yes Discussing patient identified problems/goals with staff: Yes Medical problems stabilized or resolved:  Yes Denies suicidal/homicidal ideation: Yes Patient has not harmed self or Others: Yes  New problem(s) identified: None Identified  Discharge Plan or Barriers:  CSW is assessing for appropriate referrals. Will refer patient to [facility] and [facility]  Additional comments: N/A  Reason for Continuation of Hospitalization: Anxiety Depression Medication stabilization Withdrawal symptoms   Estimated length of stay: 3 days  For review of initial/current patient goals, please see plan of care.  Attendees: Patient:     Family:     Physician:  Geoffery Lyons 10/06/2012 9:36 AM   Clinical Social Worker Ronda Fairly 10/06/2012 9:36 AM   Other:  Robbie Louis, RN 10/06/2012 9:36 AM   Other: Serena Colonel, NP 10/06/2012 9:36 AM  Other:  Other: Alla German PA Student  10/06/2012 9:36 AM   Other:  Other: Edger House, MA [employer] Psych Intern  10/06/2012 9:36 AM    Scribe for Treatment Team:   Carney Bern, LCSWA  10/06/2012 9:36 AM

## 2012-10-06 NOTE — B[facility] Group Notes (Signed)
[facility] LCSW Group Therapy  10/06/2012 1:15 PM  Type of Therapy:  Group Therapy  Participation Level:  Minimal  Participation Quality:  Drowsy and Resistant  Affect:  Irritable  Cognitive:  Asleep for majority of group  Insight:  None shared  Engagement in Therapy:  None  Modes of Intervention:  Limit-setting  Summary of Progress/Problems: Antonio [name] did not take part in discussion regarding emotional regulation.   Antonio [name] 10/06/2012, 4:50 PM

## 2012-10-07 MED ORDER — TRAZODONE HCL 50 MG PO TABS
50.0000 mg | ORAL_TABLET | Freq: Every evening | ORAL | Status: DC | PRN
  Administered 2012-10-07 – 2012-10-10 (×4): 50 mg via ORAL
  Filled 2012-10-07 (×4): qty 1
  Filled 2012-10-07: qty 28
  Filled 2012-10-07 (×4): qty 1
  Filled 2012-10-07: qty 28
  Filled 2012-10-07 (×2): qty 1

## 2012-10-07 NOTE — Progress Notes (Signed)
[facility] LCSW Group Therapy  10/07/2012 1:15 PM  Type of Therapy:  Group Therapy 1:15 to 2:30  Participation Level:  Active  Participation Quality:  Attentive and Sharing  Affect:  Appropriate  Cognitive:  Alert and Oriented  Insight:  Improving  Engagement in Therapy:  Improving  Modes of Intervention:  Activity, Clarification, Exploration, Socialization and Support  Summary of Progress/Problems:  Focus of group processing discussion was on balance in life; the components in life which have a negative influence on balance and the components which make for a more balanced life.  Patient was able to process feelings of shame and guilt to a small degree, although he much prefers to laugh at self. Antonio [name] did become tearful when speaking about his mother who is dying.    Clide Dales

## 2012-10-07 NOTE — Progress Notes (Signed)
[facility] MD Progress Note  10/07/2012 3:16 PM Antonio [name]  MRN:  [id number] Subjective:  He is committed to going to a rehab program. States that he has realized if he is going to be successful he needs the help. He is not clear about what will happen afterwards. He has been thinking about going back to [place] The tooth ache is better but he still feels the inflammation. He is adjusting to the Depakote Diagnosis:  Opioid / Alcohol Dependence, Mood Disorder NOS  ADL's:  Intact  Sleep: Fair  Appetite:  Fair  Suicidal Ideation:  Plan:  Denies Intent:  denies Means:  denies Homicidal Ideation:  Plan:  denies Intent:  denies Means:  denies AEB (as evidenced by):  Psychiatric Specialty Exam: Review of Systems  Constitutional: Negative.   HENT: Negative.   Eyes: Negative.   Respiratory: Negative.   Cardiovascular: Negative.   Gastrointestinal: Negative.   Genitourinary: Negative.   Musculoskeletal: Negative.   Skin: Negative.   Neurological: Negative.   Endo/Heme/Allergies: Negative.   Psychiatric/Behavioral: Positive for substance abuse. The patient is nervous/anxious.     Blood pressure 107/74, pulse 84, temperature 97.8 F (36.6 C), temperature source Oral, resp. rate 16, height 5\' 8"  (1.727 m), weight 63.05 kg (139 lb).Body mass index is 21.14 kg/(m^2).  General Appearance: Fairly Groomed  Patent attorney::  Fair  Speech:  Clear and Coherent and rapid  Volume:  fluctuates  Mood:  Anxious  Affect:  anxious, restless  Thought Process:  Coherent and Goal Directed  Orientation:  Full (Time, Place, and Person)  Thought Content:  worries, concerns  Suicidal Thoughts:  No  Homicidal Thoughts:  No  Memory:  Immediate;   Fair Recent;   Fair Remote;   Fair  Judgement:  Fair  Insight:  Shallow  Psychomotor Activity:  Restlessness  Concentration:  Fair  Recall:  Fair  Akathisia:  No  Handed:  Right  AIMS (if indicated):     Assets:  Desire for Improvement  Sleep:   Number of Hours: 6.5   Current Medications: Current Facility-Administered Medications  Medication Dose Route Frequency Provider Last Rate Last Dose  . acetaminophen (TYLENOL) tablet 650 mg  650 mg Oral Q6H PRN Verne Spurr, PA-C   650 mg at 10/05/12 0543  . alum & mag hydroxide-simeth (MAALOX/MYLANTA) 200-200-20 MG/5ML suspension 30 mL  30 mL Oral Q4H PRN Verne Spurr, PA-C      . benzocaine (ORAJEL) 10 % mucosal gel   Mouth/Throat QID PRN Sanjuana Kava, NP      . chlordiazePOXIDE (LIBRIUM) capsule 25 mg  25 mg Oral Q6H PRN Verne Spurr, PA-C      . chlordiazePOXIDE (LIBRIUM) capsule 25 mg  25 mg Oral BH-qamhs Verne Spurr, PA-C       Followed by  . [START ON 10/09/2012] chlordiazePOXIDE (LIBRIUM) capsule 25 mg  25 mg Oral Daily Verne Spurr, PA-C      . divalproex (DEPAKOTE ER) 24 hr tablet 1,000 mg  1,000 mg Oral QHS Kerry Hough, PA-C   1,000 mg at 10/06/12 2203  . doxycycline (VIBRA-TABS) tablet 100 mg  100 mg Oral Q12H Sanjuana Kava, NP   100 mg at 10/07/12 1191  . gabapentin (NEURONTIN) capsule 300 mg  300 mg Oral TID Rachael Fee, MD   300 mg at 10/07/12 1200  . hydrOXYzine (ATARAX/VISTARIL) tablet 25 mg  25 mg Oral Q6H PRN Verne Spurr, PA-C   25 mg at 10/06/12 2203  . ibuprofen (ADVIL,MOTRIN)  tablet 600 mg  600 mg Oral Q6H PRN Sanjuana Kava, NP   600 mg at 10/07/12 1503  . loperamide (IMODIUM) capsule 2-4 mg  2-4 mg Oral PRN Verne Spurr, PA-C   4 mg at 10/05/12 1246  . magnesium hydroxide (MILK OF MAGNESIA) suspension 30 mL  30 mL Oral Daily PRN Verne Spurr, PA-C      . multivitamin with minerals tablet 1 tablet  1 tablet Oral Daily Verne Spurr, PA-C   1 tablet at 10/07/12 [phone]  . nicotine (NICODERM CQ - dosed in mg/24 hours) patch 21 mg  21 mg Transdermal Q0600 Kerry Hough, PA-C   21 mg at 10/07/12 9604  . ondansetron (ZOFRAN-ODT) disintegrating tablet 4 mg  4 mg Oral Q6H PRN Verne Spurr, PA-C      . thiamine (VITAMIN B-1) tablet 100 mg  100 mg Oral Daily Verne Spurr, PA-C   100 mg at 10/07/12 5409    Lab Results: No results found for this or any previous visit (from the past 48 hour(s)).  Physical Findings: AIMS: Facial and Oral Movements Muscles of Facial Expression: None, normal Lips and Perioral Area: None, normal Jaw: None, normal Tongue: None, normal,Extremity Movements Upper (arms, wrists, hands, fingers): None, normal Lower (legs, knees, ankles, toes): None, normal, Trunk Movements Neck, shoulders, hips: None, normal, Overall Severity Severity of abnormal movements (highest score from questions above): None, normal Incapacitation due to abnormal movements: None, normal Patient's awareness of abnormal movements (rate only patient's report): No Awareness, Dental Status Current problems with teeth and/or dentures?: Yes Does patient usually wear dentures?: No  CIWA:  CIWA-Ar Total: 0 COWS:     Treatment Plan Summary: Daily contact with patient to assess and evaluate symptoms and progress in treatment Medication management  Plan: Supportive approach/coping skills/relapse prevention           Facilitate referral to [facility]           Continue and complete the Detox  Medical Decision Making Problem Points:  Review of psycho-social stressors (1) Data Points:  Review of medication regiment & side effects (2) Review of new medications or change in dosage (2)  I certify that inpatient services furnished can reasonably be expected to improve the patient's condition.   Mylinh Cragg A 10/07/2012, 3:16 PM

## 2012-10-07 NOTE — Progress Notes (Signed)
Patient did attend the evening karaoke group. Pt was not very attentive and socialized with a pt from [address] the entire group.

## 2012-10-07 NOTE — B[facility] Group Notes (Signed)
[facility] LCSW Aftercare Discharge Planning Group Note   10/07/2012 8:45 AM  Participation Quality:  Did not attend, although CSW went to pt's room and specifically requested he attend   Tnia Anglada, Julious Payer

## 2012-10-07 NOTE — Progress Notes (Signed)
Patient ID: Antonio [name], male   DOB: Oct 26, 1970, 42 y.o.   MRN: [id number] He has been up and to part of the  Groups and in bed remainder of the group time. He is up and interacting  With peers and staff.  Self inventory:  Depression 1, hopelessness 0, denies withdrawals.  He continues to c/o tooth pain and was given a prn for the pain  That relieved some of the pain.

## 2012-10-07 NOTE — Progress Notes (Signed)
Pt reports he is doing better.  He is still having tooth discomfort and plans to go have dental work after he is discharged.  He denies SI/HI/AV.  He feels his withdrawal symptoms are still minimal.  Pt makes his needs known and voices no needs or concerns at this time.  He says he has been going to groups.  Support and encouragement offered.  Safety maintained with q15 minute checks.

## 2012-10-07 NOTE — B[facility] Suicide Risk Assessment (Signed)
[facility] INPATIENT:  Family/Significant Other Suicide Prevention Education  Suicide Prevention Education:  Education Completed; Burman Nieves at [phone] has been identified by the patient as the freind with whom the patient will be residing, and identified as the person(s) who will aid the patient in the event of a mental health crisis (suicidal ideations/suicide attempt).  With written consent from the patient, the family member/significant other has been provided the following suicide prevention education, prior to the and/or following the discharge of the patient.  The suicide prevention education provided includes the following:  Suicide risk factors  Suicide prevention and interventions  National Suicide Hotline telephone number  [facility] assessment telephone number  [facility] Emergency Assistance 911  [facility] and/or Residential Mobile Crisis Unit telephone number  Request made of family/significant other to:  Remove weapons (e.g., guns, rifles, knives), all items previously/currently identified as safety concern.    Remove drugs/medications (over-the-counter, prescriptions, illicit drugs), all items previously/currently identified as a safety concern.  The family member/significant other verbalizes understanding of the suicide prevention education information provided.  The family member/significant other agrees to remove the items of safety concern listed above.  Clide Dales 10/07/2012, 3:02 PM

## 2012-10-07 NOTE — Progress Notes (Signed)
Patient ID: Antonio [name], male   DOB: 1970/12/24, 42 y.o.   MRN: [id number]  D: Pt denies SI/HI/AVH. Pt is pleasant and cooperative. Pt only complaint is his tooth pain, that he says is constant 7 out of 10. Pt seems happy and optimistic about treatment.   A: Pt was offered support and encouragement. Pt was given scheduled medications. Pt was encourage to attend groups. Q 15 minute checks were done for safety.   R:Pt attends groups and interacts well with peers and staff. Pt is taking medication. Pt has no complaints other than tooth at this time.Pt receptive to treatment and safety maintained on unit. Pt did not mention his tooth pain after coming back from [place].

## 2012-10-08 NOTE — Progress Notes (Signed)
Adult Psychoeducational Group Note  Date:  10/08/2012 Time:  11:57 AM  Group Topic/Focus:  Relapse Prevention Planning:   The focus of this group is to define relapse and discuss the need for planning to combat relapse.  Participation Level:  Minimal  Participation Quality:  Redirectable  Affect:  Excited  Cognitive:  Oriented  Insight: Good  Engagement in Group:  Limited  Modes of Intervention:  Discussion, Education and Support  Additional Comments:  Pt stated his goal was to stay until Monday to get further treatment. Pt   Sherlonda Flater T 10/08/2012, 11:57 AM

## 2012-10-08 NOTE — Progress Notes (Signed)
Patient ID: Antonio [name], male   DOB: October 13, 1970, 42 y.o.   MRN: [id number] 10-08-12 nursing shift note: D: pt has been visible in the milieu today. His only complaint  has been tooth pain, but he has not complained of that on this shift. He denies any si/hi.  A: his ciwa has been "0" and he was advised if he needs any prn's for his tooth to make staff aware. R: staff continue to support and encourage. rn will monitor and q 15 min cks continue.

## 2012-10-08 NOTE — Progress Notes (Signed)
Patient ID: Antonio [name], male   DOB: 24-Oct-1970, 42 y.o.   MRN: [id number]  D: Pt denies SI/HI/AVH. Pt is pleasant and cooperative. Pt saw GF today and was in good spirits. Pt did not complain of tooth pain tonight. Pt states "I had a good day".  A: Pt was offered support and encouragement. Pt was given scheduled medications. Pt was encourage to attend groups. Q 15 minute checks were done for safety.   R:Pt attends groups and interacts well with peers and staff. Pt is taking medication. Pt has no complaints at this time.Pt receptive to treatment and safety maintained on unit.

## 2012-10-08 NOTE — Progress Notes (Signed)
[facility] MD Progress Note  10/08/2012 12:49 PM Antonio [name]  MRN:  [id number] Subjective:  Has a bed at [facility] on May 1. His neighbor is going to be moving this Sunday. He is the main person he uses with. He would like to wait until the neighbor leaves before he is discharged to wait for the [facility] bed. States that he knows it will be hard to stay abstinent if he was out, around his neighbor. He admits that he is worried, concerned that he is going to relapse. He states that he has realized that he will lose everything if he was to go back to using. Has not felt so pressured to quit before. He also has his teeth to take care of once he is out. He still has pain Diagnosis:  Opioid/Alchol Dependence, Unspecified Mood Disorder  ADL's:  Intact  Sleep: Fair  Appetite:  Fair  Suicidal Ideation:  Plan:  denies Intent:  denies Means:  denies Homicidal Ideation:  Plan:  denies Intent:  denies Means:  denies AEB (as evidenced by):  Psychiatric Specialty Exam: Review of Systems  Constitutional: Negative.   HENT: Negative.   Eyes: Negative.   Respiratory: Negative.   Cardiovascular: Negative.   Gastrointestinal: Negative.   Genitourinary: Negative.   Musculoskeletal: Negative.   Skin: Negative.   Neurological: Negative.   Endo/Heme/Allergies: Negative.   Psychiatric/Behavioral: Positive for substance abuse. The patient is nervous/anxious.     Blood pressure 123/79, pulse 63, temperature 97.5 F (36.4 C), temperature source Oral, resp. rate 18, height 5\' 8"  (1.727 m), weight 63.05 kg (139 lb).Body mass index is 21.14 kg/(m^2).  General Appearance: Fairly Groomed  Patent attorney::  Fair  Speech:  Clear and Coherent and rapid  Volume:  fluctuates  Mood:  Anxious and worried  Affect:  anxious  Thought Process:  Coherent and Goal Directed  Orientation:  Full (Time, Place, and Person)  Thought Content:  worries about relapsing if he was to be discharged while his neighbor is there   Suicidal Thoughts:  No  Homicidal Thoughts:  No  Memory:  Immediate;   Fair Recent;   Fair Remote;   Fair  Judgement:  Fair  Insight:  Present  Psychomotor Activity:  Restlessness  Concentration:  Poor  Recall:  Fair  Akathisia:  No  Handed:  Right  AIMS (if indicated):     Assets:  Desire for Improvement  Sleep:  Number of Hours: 4.5   Current Medications: Current Facility-Administered Medications  Medication Dose Route Frequency Provider Last Rate Last Dose  . acetaminophen (TYLENOL) tablet 650 mg  650 mg Oral Q6H PRN Verne Spurr, PA-C   650 mg at 10/05/12 0543  . alum & mag hydroxide-simeth (MAALOX/MYLANTA) 200-200-20 MG/5ML suspension 30 mL  30 mL Oral Q4H PRN Verne Spurr, PA-C      . benzocaine (ORAJEL) 10 % mucosal gel   Mouth/Throat QID PRN Sanjuana Kava, NP      . Melene Muller ON 10/09/2012] chlordiazePOXIDE (LIBRIUM) capsule 25 mg  25 mg Oral Daily Verne Spurr, PA-C      . divalproex (DEPAKOTE ER) 24 hr tablet 1,000 mg  1,000 mg Oral QHS Kerry Hough, PA-C   1,000 mg at 10/07/12 2226  . doxycycline (VIBRA-TABS) tablet 100 mg  100 mg Oral Q12H Sanjuana Kava, NP   100 mg at 10/08/12 0752  . gabapentin (NEURONTIN) capsule 300 mg  300 mg Oral TID Rachael Fee, MD   300 mg at 10/08/12 1212  .  ibuprofen (ADVIL,MOTRIN) tablet 600 mg  600 mg Oral Q6H PRN Sanjuana Kava, NP   600 mg at 10/08/12 0547  . magnesium hydroxide (MILK OF MAGNESIA) suspension 30 mL  30 mL Oral Daily PRN Verne Spurr, PA-C      . multivitamin with minerals tablet 1 tablet  1 tablet Oral Daily Verne Spurr, PA-C   1 tablet at 10/08/12 1610  . nicotine (NICODERM CQ - dosed in mg/24 hours) patch 21 mg  21 mg Transdermal Q0600 Kerry Hough, PA-C   21 mg at 10/08/12 0700  . thiamine (VITAMIN B-1) tablet 100 mg  100 mg Oral Daily Verne Spurr, PA-C   100 mg at 10/08/12 0753  . traZODone (DESYREL) tablet 50 mg  50 mg Oral QHS,MR X 1 Sanjuana Kava, NP   50 mg at 10/07/12 2305    Lab Results: No results  found for this or any previous visit (from the past 48 hour(s)).  Physical Findings: AIMS: Facial and Oral Movements Muscles of Facial Expression: None, normal Lips and Perioral Area: None, normal Jaw: None, normal Tongue: None, normal,Extremity Movements Upper (arms, wrists, hands, fingers): None, normal Lower (legs, knees, ankles, toes): None, normal, Trunk Movements Neck, shoulders, hips: None, normal, Overall Severity Severity of abnormal movements (highest score from questions above): None, normal Incapacitation due to abnormal movements: None, normal Patient's awareness of abnormal movements (rate only patient's report): No Awareness, Dental Status Current problems with teeth and/or dentures?: Yes Does patient usually wear dentures?: No  CIWA:  CIWA-Ar Total: 0 COWS:     Treatment Plan Summary: Daily contact with patient to assess and evaluate symptoms and progress in treatment Medication management  Plan: Supportive approach/coping skills/relapse prevention           Continue to address the co morbidities (Mood Disorder)  Medical Decision Making Problem Points:  Review of last therapy session (1) and Review of psycho-social stressors (1) Data Points:  Review of medication regiment & side effects (2)  I certify that inpatient services furnished can reasonably be expected to improve the patient's condition.   Samera Macy A 10/08/2012, 12:49 PM

## 2012-10-08 NOTE — [facility] Group Notes (Signed)
[facility] LCSW Aftercare Discharge Planning Group Note   10/08/2012 8:45 AM  Participation Quality:    Mood/Affect:  Appropriate  Depression Rating:  5  Anxiety Rating:  2  Thoughts of Suicide:  No Will you contract for safety?   NA  Current AVH:  NA  Plan for Discharge/Comments:  Followup at [place], [place] and [facility] admit date of 5/1/  Transportation Means:  Friend  Supports: Friend, family and Karen Kitchens, Julious Payer

## 2012-10-08 NOTE — Progress Notes (Signed)
[facility] LCSW Group Therapy  10/08/2012 1:15 PM  Type of Therapy:  Group Therapy 1:15 to 2:30 PM  Participation Level: Distracted  Participation Quality: Limited  Affect: Lethargic, drowsy  Cognitive:  Oriented  Insight: None shared  Engagement in Therapy: None  Modes of Intervention: Discussion, Exploration, Education and Support  Summary of Progress/Problems: Group session included an educational portion on Post Acute Withdrawal Syndrome (PAWS).  Antonio [name] was only focused on another patient he shared space with on settee. Open to redirection at several points.    Clide Dales

## 2012-10-09 MED ORDER — LOPERAMIDE HCL 2 MG PO CAPS
2.0000 mg | ORAL_CAPSULE | ORAL | Status: DC | PRN
  Administered 2012-10-09: 4 mg via ORAL

## 2012-10-09 NOTE — Progress Notes (Signed)
Patient ID: Antonio [name], male   DOB: Feb 18, 1971, 42 y.o.   MRN: [id number] 10-09-12 nursing shift note: D: extender stated that this pt may be discharged on Monday 10-11-12. The pt requested that they be discharged early, because they want to go to church easter Sunday. This pt had a toothache today.  A: his tooth pain is being managed with ibuprofen prn.  RN advised pt that the plan is for him to leave on Monday. R: he was ok with being discharged on Monday, but he had a concern that he needed to give his roommate a ride home who may be discharged on easter Sunday . RN advised this pt I could not discuss another pt's care with him. Also told him this type of arrangement is discouraged. He said "ok". This pt has denied any withdrawal symptoms and denies any si/hi/av.

## 2012-10-09 NOTE — Progress Notes (Signed)
Patient ID: Wheeler Incorvaia, male   DOB: May 04, 1971, 42 y.o.   MRN: [id number] [facility] MD Progress Note  10/09/2012 11:55 AM Antonio [name]  MRN:  [id number]  Subjective:  Has a bed at [facility] on May 1. Hoping that he will not relapse prior to May 1st. Although knows that he this problems, it's much harder for him to resist the cravings when it starts. He is determined to get cleaned and stay clean. He says he still hears voices and sees some people. But when he talks to them, they don't talk back, they just will go away and that scares him at times.  Diagnosis:  Opioid/Alchol Dependence, Unspecified Mood Disorder  ADL's:  Intact  Sleep: Fair  Appetite:  Fair  Suicidal Ideation:  Plan:  denies Intent:  denies Means:  denies Homicidal Ideation:  Plan:  denies Intent:  denies Means:  denies AEB (as evidenced by):  Psychiatric Specialty Exam: Review of Systems  Constitutional: Negative.   HENT: Negative.   Eyes: Negative.   Respiratory: Negative.   Cardiovascular: Negative.   Gastrointestinal: Negative.   Genitourinary: Negative.   Musculoskeletal: Negative.   Skin: Negative.   Neurological: Negative.   Endo/Heme/Allergies: Negative.   Psychiatric/Behavioral: Positive for substance abuse. The patient is nervous/anxious.     Blood pressure 110/79, pulse 90, temperature 97.8 F (36.6 C), temperature source Oral, resp. rate 16, height 5\' 8"  (1.727 m), weight 63.05 kg (139 lb).Body mass index is 21.14 kg/(m^2).  General Appearance: Fairly Groomed  Patent attorney::  Fair  Speech:  Clear and Coherent and rapid  Volume:  fluctuates  Mood:  Anxious and worried  Affect:  anxious  Thought Process:  Coherent and Goal Directed  Orientation:  Full (Time, Place, and Person)  Thought Content:  worries about relapsing if he was to be discharged while his neighbor is there  Suicidal Thoughts:  No  Homicidal Thoughts:  No  Memory:  Immediate;   Fair Recent;   Fair Remote;   Fair   Judgement:  Fair  Insight:  Present  Psychomotor Activity:  Restlessness  Concentration:  Poor  Recall:  Fair  Akathisia:  No  Handed:  Right  AIMS (if indicated):     Assets:  Desire for Improvement  Sleep:  Number of Hours: 5   Current Medications: Current Facility-Administered Medications  Medication Dose Route Frequency Provider Last Rate Last Dose  . acetaminophen (TYLENOL) tablet 650 mg  650 mg Oral Q6H PRN Verne Spurr, PA-C   650 mg at 10/05/12 0543  . alum & mag hydroxide-simeth (MAALOX/MYLANTA) 200-200-20 MG/5ML suspension 30 mL  30 mL Oral Q4H PRN Verne Spurr, PA-C      . benzocaine (ORAJEL) 10 % mucosal gel   Mouth/Throat QID PRN Sanjuana Kava, NP      . divalproex (DEPAKOTE ER) 24 hr tablet 1,000 mg  1,000 mg Oral QHS Kerry Hough, PA-C   1,000 mg at 10/08/12 2309  . doxycycline (VIBRA-TABS) tablet 100 mg  100 mg Oral Q12H Sanjuana Kava, NP   100 mg at 10/09/12 0829  . gabapentin (NEURONTIN) capsule 300 mg  300 mg Oral TID Rachael Fee, MD   300 mg at 10/09/12 0829  . ibuprofen (ADVIL,MOTRIN) tablet 600 mg  600 mg Oral Q6H PRN Sanjuana Kava, NP   600 mg at 10/08/12 0547  . magnesium hydroxide (MILK OF MAGNESIA) suspension 30 mL  30 mL Oral Daily PRN Verne Spurr, PA-C      .  multivitamin with minerals tablet 1 tablet  1 tablet Oral Daily Verne Spurr, PA-C   1 tablet at 10/09/12 1610  . nicotine (NICODERM CQ - dosed in mg/24 hours) patch 21 mg  21 mg Transdermal Q0600 Kerry Hough, PA-C   21 mg at 10/09/12 9604  . thiamine (VITAMIN B-1) tablet 100 mg  100 mg Oral Daily Verne Spurr, PA-C   100 mg at 10/09/12 5409  . traZODone (DESYREL) tablet 50 mg  50 mg Oral QHS,MR X 1 Sanjuana Kava, NP   50 mg at 10/08/12 2308    Lab Results: No results found for this or any previous visit (from the past 48 hour(s)).  Physical Findings: AIMS: Facial and Oral Movements Muscles of Facial Expression: None, normal Lips and Perioral Area: None, normal Jaw: None,  normal Tongue: None, normal,Extremity Movements Upper (arms, wrists, hands, fingers): None, normal Lower (legs, knees, ankles, toes): None, normal, Trunk Movements Neck, shoulders, hips: None, normal, Overall Severity Severity of abnormal movements (highest score from questions above): None, normal Incapacitation due to abnormal movements: None, normal Patient's awareness of abnormal movements (rate only patient's report): No Awareness, Dental Status Current problems with teeth and/or dentures?: Yes Does patient usually wear dentures?: No  CIWA:  CIWA-Ar Total: 0 COWS:     Treatment Plan Summary: Daily contact with patient to assess and evaluate symptoms and progress in treatment Medication management  Plan: Supportive approach/coping skills/relapse prevention Continue to address the co morbidities (Mood Disorder). Encouraged to ask for pain medications for his tooth pain (Motrin and Orajel) as needed. Continue current treatment plan.  Medical Decision Making Problem Points:  Review of last therapy session (1) and Review of psycho-social stressors (1) Data Points:  Review of medication regiment & side effects (2)  I certify that inpatient services furnished can reasonably be expected to improve the patient's condition.   Armandina Stammer I 10/09/2012, 11:55 AM

## 2012-10-09 NOTE — Progress Notes (Signed)
Patient did attend the evening speaker AA meeting.  

## 2012-10-09 NOTE — Progress Notes (Signed)
Psychoeducational Group Note  Date:  10/09/2012 Time:  0945 am  Group Topic/Focus:  Identifying Needs:   The focus of this group is to help patients identify their personal needs that have been historically problematic and identify healthy behaviors to address their needs.  Participation Level:  Active  Participation Quality:  Appropriate  Affect:  Appropriate  Cognitive:  Alert  Insight:  Distracting  Engagement in Group:  Limited  Additional Comments:    Andrena Mews 10/09/2012,2:08 PM

## 2012-10-09 NOTE — Progress Notes (Signed)
Patient ID: Antonio [name], male   DOB: December 14, 1970, 42 y.o.   MRN: [id number]  D: Pt denies SI/HI/AVH. Pt is pleasant and cooperative. Pt mother visited today, and pt was in good spirits. Pt complained of tooth pain 8 out of 10 after getting advil. Pt states he can't wait to get out of here so he can get tooth pulled.   A: Pt was offered support and encouragement. Pt was given scheduled medications. Pt was encourage to attend groups. Q 15 minute checks were done for safety.   R:Pt attends groups and interacts well with peers and staff. Pt is taking medication. Pt has no complaints at this time.Pt receptive to treatment and safety maintained on unit.

## 2012-10-09 NOTE — Progress Notes (Signed)
Pt attended AA group this evening.  

## 2012-10-09 NOTE — [facility] Group Notes (Addendum)
[facility] LCSW Group Therapy  10/09/2012 10:00-11:00AM  Summary of Progress/Problems:   The main focus of today's process group was for the patient to identify ways in which they have in the past sabotaged their own recovery. Motivational Interviewing was utilized to ask the group members what they get out of their substance use, and what they want to change.  The Stages of Change were explained, and members identified where they currently are with regard to stages of change.  The patient expressed that he uses alcohol to suppress his anger, and joked about being a "lover" that his girl likes whenever he is drunk.  On the other hand, he stated his motivation to stop drinking is to "not lose my girl."  He was in and out of the room multiple times and it was not possible to challenge his thinking or try to discuss the discrepancy.  He was laughing inappropriately at odd times and did not appear really connected with the subject matter.  Type of Therapy:  Group Therapy  Participation Level:  Minimal  Participation Quality:  Inattentive  Affect:  Laughing, smiling  Cognitive:  Alert  Insight:  Limited  Engagement in Therapy:  Limited  Modes of Intervention:  Education and Exploration   Sarina Ser 10/09/2012, 12:01 PM

## 2012-10-09 NOTE — Progress Notes (Signed)
Patient ID: Antonio [name], male   DOB: 02/20/1971, 42 y.o.   MRN: [id number] Psychoeducational Group Note  Date:  10/09/2012 Time:1000am  Group Topic/Focus:  Identifying Needs:   The focus of this group is to help patients identify their personal needs that have been historically problematic and identify healthy behaviors to address their needs.  Participation Level:  Active  Participation Quality:  Appropriate  Affect:  Appropriate  Cognitive:  Appropriate  Insight:  Engaged  Engagement in Group:  Engaged  Additional Comments:  Inventory group and healthy coping skills.   Valente David 10/09/2012,12:35 PM

## 2012-10-10 DIAGNOSIS — F1994 Other psychoactive substance use, unspecified with psychoactive substance-induced mood disorder: Secondary | ICD-10-CM

## 2012-10-10 DIAGNOSIS — F1910 Other psychoactive substance abuse, uncomplicated: Secondary | ICD-10-CM

## 2012-10-10 LAB — VALPROIC ACID LEVEL: Valproic Acid Lvl: 62.7 ug/mL (ref 50.0–100.0)

## 2012-10-10 MED ORDER — DIPHENHYDRAMINE HCL 50 MG PO CAPS
50.0000 mg | ORAL_CAPSULE | Freq: Four times a day (QID) | ORAL | Status: AC
  Administered 2012-10-10 – 2012-10-11 (×5): 50 mg via ORAL
  Filled 2012-10-10 (×5): qty 1

## 2012-10-10 MED ORDER — DIPHENHYDRAMINE HCL 25 MG PO CAPS: 50.0000 mg | ORAL_CAPSULE | ORAL | Status: DC | PRN

## 2012-10-10 NOTE — Progress Notes (Signed)
Patient did attend the evening speaker AA meeting.  

## 2012-10-10 NOTE — Progress Notes (Signed)
Met with pt 1:1 who continues to complain of significant R sided tooth pain. He states the motrin is giving him some relief (decreased from 8/10 to 6/10) but he is anxious for discharge so that he can get it extracted. He denies any withdrawal but remains anxious, depressed in affect and mood. Supported and encouraged. Medicated per orders. Pt is cooperative and compliant with treatment. Denies SI/HI/AVH at this time and remains safe. Lawrence Marseilles

## 2012-10-10 NOTE — Progress Notes (Signed)
[facility] MD Progress Note  10/10/2012 11:12 AM Antonio [name]  MRN:  [id number] Subjective:  Denies depression, anxiety, and suicidal thoughts.  He would like to discharge tomorrow, I let him know his regular provider would discuss it with him tomorrow.  No withdrawal symptoms noted, sleep and appetite good, interacting in the dayroom, attending and participating in groups.  Diagnosis:   Axis I: Substance Abuse and Substance Induced Mood Disorder Axis II: Deferred Axis III:  Past Medical History  Diagnosis Date  . Hepatitis     hep b and c  . Ruptured spleen   . Pain in thoracic spine   . Chronic pain syndrome   . Depression    Axis IV: economic problems, occupational problems, other psychosocial or environmental problems, problems related to social environment and problems with primary support group Axis V: 51-60 moderate symptoms  ADL's:  Intact  Sleep: Good  Appetite:  Good  Suicidal Ideation:  Denies Homicidal Ideation:  Denies  Psychiatric Specialty Exam: Review of Systems  Constitutional: Negative.   HENT: Negative.   Eyes: Negative.   Respiratory: Negative.   Cardiovascular: Negative.   Gastrointestinal: Negative.   Genitourinary: Negative.   Musculoskeletal: Negative.   Skin: Negative.   Neurological: Negative.   Endo/Heme/Allergies: Negative.   Psychiatric/Behavioral: Positive for substance abuse.    Blood pressure 103/72, pulse 98, temperature 98.1 F (36.7 C), temperature source Oral, resp. rate 18, height 5\' 8"  (1.727 m), weight 63.05 kg (139 lb).Body mass index is 21.14 kg/(m^2).  General Appearance: Casual  Eye Contact::  Fair  Speech:  Normal Rate  Volume:  Normal  Mood:  Euthymic  Affect:  Congruent  Thought Process:  Coherent  Orientation:  Full (Time, Place, and Person)  Thought Content:  WDL  Suicidal Thoughts:  No  Homicidal Thoughts:  No  Memory:  Immediate;   Fair Recent;   Fair Remote;   Fair  Judgement:  Fair  Insight:  Fair   Psychomotor Activity:  Normal  Concentration:  Fair  Recall:  Fair  Akathisia:  No  Handed:  Right  AIMS (if indicated):     Assets:  Communication Skills Desire for Improvement  Sleep:  Number of Hours: 5.5   Current Medications: Current Facility-Administered Medications  Medication Dose Route Frequency Provider Last Rate Last Dose  . acetaminophen (TYLENOL) tablet 650 mg  650 mg Oral Q6H PRN Verne Spurr, PA-C   650 mg at 10/10/12 0654  . alum & mag hydroxide-simeth (MAALOX/MYLANTA) 200-200-20 MG/5ML suspension 30 mL  30 mL Oral Q4H PRN Verne Spurr, PA-C      . benzocaine (ORAJEL) 10 % mucosal gel   Mouth/Throat QID PRN Sanjuana Kava, NP      . divalproex (DEPAKOTE ER) 24 hr tablet 1,000 mg  1,000 mg Oral QHS Kerry Hough, PA-C   1,000 mg at 10/09/12 2241  . doxycycline (VIBRA-TABS) tablet 100 mg  100 mg Oral Q12H Sanjuana Kava, NP   100 mg at 10/10/12 0757  . gabapentin (NEURONTIN) capsule 300 mg  300 mg Oral TID Rachael Fee, MD   300 mg at 10/10/12 0757  . ibuprofen (ADVIL,MOTRIN) tablet 600 mg  600 mg Oral Q6H PRN Sanjuana Kava, NP   600 mg at 10/09/12 1159  . loperamide (IMODIUM) capsule 2-4 mg  2-4 mg Oral PRN Shuvon Rankin, NP   4 mg at 10/09/12 1840  . magnesium hydroxide (MILK OF MAGNESIA) suspension 30 mL  30 mL Oral Daily PRN Lloyd Huger  [place], PA-C      . multivitamin with minerals tablet 1 tablet  1 tablet Oral Daily Verne Spurr, PA-C   1 tablet at 10/10/12 0757  . nicotine (NICODERM CQ - dosed in mg/24 hours) patch 21 mg  21 mg Transdermal Q0600 Kerry Hough, PA-C   21 mg at 10/10/12 0656  . thiamine (VITAMIN B-1) tablet 100 mg  100 mg Oral Daily Verne Spurr, PA-C   100 mg at 10/10/12 0757  . traZODone (DESYREL) tablet 50 mg  50 mg Oral QHS,MR X 1 Sanjuana Kava, NP   50 mg at 10/09/12 2241    Lab Results:  Results for orders placed during the hospital encounter of 10/04/12 (from the past 48 hour(s))  VALPROIC ACID LEVEL     Status: None   Collection Time     10/09/12  6:53 AM      Result Value Range   Valproic Acid Lvl 62.7  50.0 - 100.0 ug/mL    Physical Findings: AIMS: Facial and Oral Movements Muscles of Facial Expression: None, normal Lips and Perioral Area: None, normal Jaw: None, normal Tongue: None, normal,Extremity Movements Upper (arms, wrists, hands, fingers): None, normal Lower (legs, knees, ankles, toes): None, normal, Trunk Movements Neck, shoulders, hips: None, normal, Overall Severity Severity of abnormal movements (highest score from questions above): None, normal Incapacitation due to abnormal movements: None, normal Patient's awareness of abnormal movements (rate only patient's report): No Awareness, Dental Status Current problems with teeth and/or dentures?: Yes Does patient usually wear dentures?: No  CIWA:  CIWA-Ar Total: 0 COWS:     Treatment Plan Summary: Daily contact with patient to assess and evaluate symptoms and progress in treatment Medication management  Plan:  Review of chart, vital signs, medications, and notes. 1-Individual and group therapy 2-Medication management for depression and anxiety:  Medications reviewed with the patient and he stated no untoward effects 3-Coping skills for mood disorder and substance abuse 4-Continue crisis stabilization and management 5-Address health issues--monitoring vital signs, stable 6-Treatment plan in progress to prevent relapse of mood instability and substance abuse  Medical Decision Making Problem Points:  Established problem, stable/improving (1) and Review of psycho-social stressors (1) Data Points:  Review of medication regiment & side effects (2)  I certify that inpatient services furnished can reasonably be expected to improve the patient's condition.   Nanine Means, PMH-NP 10/10/2012, 11:12 AM

## 2012-10-10 NOTE — [facility] Group Notes (Signed)
[facility] LCSW Group Therapy  10/10/2012 10:38 AM  Type of Therapy:  Group Therapy  Participation Level:  Minimal  Participation Quality:  Attentive, Sharing and Supportive  Affect:  Appropriate  Cognitive:  Appropriate  Insight:  Developing/Improving  Engagement in Therapy:  Developing/Improving  Modes of Intervention:  Socialization and Support  Summary of Progress/Problems:  Group was held informally, as most patients were sleeping.  Discussed who will support patient at discharge, including as many details as possible about family supports.  Sarina Ser 10/10/2012, 10:38 AM

## 2012-10-10 NOTE — Progress Notes (Addendum)
Pt is very pleasant and cooperative. Stated he hopes his DR will send him somewhere to get his tooth pulled as at times it really hurts badly. Pt states he is almost ready to go as he is feeling better.He is hopeful he will be able to go the the program at [facility]. Offered pt. Ice for face and declined. Pt has been going to groups . Denies SI or Hi and contracts for safety. Pt feels hopeless a 5/10 and depressed a 10/10.Marland Kitchen He is unable to say why. Pt does seem to get along with the other pts. Pt remains safe with q 15 minute checks-pt given 600mg  of motrin for tooth ache at 11am- stated it did make him feel better. Pt returned from [place] and c./o throat felt itchy and he was shaking. No swollen tongue or difficulty breathing. Pulse ox on room air 100% pulse 68. BP 114/28. RR 18. NP made aware -Pt given 50mg  of benedryl . Will continue to monitor closely. Pt stated,"I ate catfish and have never had it before."

## 2012-10-11 ENCOUNTER — Encounter (HOSPITAL_COMMUNITY): Payer: Self-pay | Admitting: *Deleted

## 2012-10-11 MED ORDER — TRAZODONE HCL 50 MG PO TABS: 50.0000 mg | ORAL_TABLET | Freq: Every evening | ORAL | Status: DC | PRN

## 2012-10-11 MED ORDER — GABAPENTIN 300 MG PO CAPS: 300.0000 mg | ORAL_CAPSULE | Freq: Three times a day (TID) | ORAL | Status: DC

## 2012-10-11 MED ORDER — DOXYCYCLINE HYCLATE 100 MG PO TABS: 100.0000 mg | ORAL_TABLET | Freq: Two times a day (BID) | ORAL | Status: DC

## 2012-10-11 MED ORDER — DIVALPROEX SODIUM ER 500 MG PO TB24: 1000.0000 mg | ORAL_TABLET | Freq: Every day | ORAL | Status: DC

## 2012-10-11 NOTE — Progress Notes (Signed)
Pt was discharged home today.  He denied any S/I H/I or A/V hallucinations.    He was given f/u appointment, rx, sample medications, and hotline info booklet.  He voiced understanding to all instructions provided.  He requested nicotine patches to go home.  Pharmacy did give out 2 week supply to help stay off cigarettes.

## 2012-10-11 NOTE — Discharge Summary (Signed)
Physician Discharge Summary Note  Patient:  Antonio [name] is an 42 y.o., male MRN:  [id number] DOB:  25-Feb-1971 Patient phone:  [phone] (home)  Patient address:   [address] [place] [address],   Date of Admission:  10/04/2012 Date of Discharge: 10/11/12  Reason for Admission:  Alcohol and Opioid intoxication  Discharge Diagnoses: Active Problems:   Opioid dependence   Unspecified episodic mood disorder   Alcohol dependence  Review of Systems  Constitutional: Negative.   HENT: Negative.   Eyes: Negative.   Respiratory: Negative.   Cardiovascular: Negative.   Gastrointestinal: Negative.   Genitourinary: Negative.   Musculoskeletal: Negative.   Skin: Negative.   Neurological: Negative.   Endo/Heme/Allergies: Negative.   Psychiatric/Behavioral: Positive for substance abuse (Alcohol abuse, Opioid dependence). Negative for depression, suicidal ideas, hallucinations and memory loss. The patient is nervous/anxious (Stabilized with medication prior to discharge) and has insomnia (Stabilized with medication prior to discharge).    Axis Diagnosis:   AXIS I:  Alcohol abuse, daily use, Opioid abuse, AXIS II:  Deferred AXIS III:   Past Medical History  Diagnosis Date  . Hepatitis     hep b and c  . Ruptured spleen   . Pain in thoracic spine   . Chronic pain syndrome   . Depression    AXIS IV:  other psychosocial or environmental problems and Polysubstance abuse AXIS V:  64  Level of Care:  [facility]  Hospital Course:  This is a 42 year old Hispanic male. Admitted to [facility] from the [facility] with complaints of suicide attempt by overdose on Aspirin and alcohol. Patient reports, "I went to the [facility] yesterday because I took too many Aspirin Pills and drank alot of alcohol too. Prior to doing this, I was having a bad tooth pain. I was trying to get my pain out. But, what happened was I passed out and my friend found me and called 911. The  ambulance brought me to the hospital. My tooth pain started last Sunday, then it got worse. I did not know what to do at the time. I was in pain. I'm still in pain. The right side of my face is hurting a lot. I have throbbing headaches. I don't have drug problems, but I do have alcohol problems. I started drinking at the age of 8. I drink about 12 packs daily. I have had DUIs in the past, none presently. I have also been to a treatment program in the past too. The longest that I have been sober is 1 year".  Upon admission into this hospital, and after admission assessment/evaluation coupled with UDS/Toxicology reports, it was determined that Antonio [name] will need detoxification treatment protocol to stabilize his system of alcohol/drug intoxications and to combat the withdrawal symptoms of these substances as well.  Antonio [name] was started on Librium treatment protocol. He was also enrolled in group counseling sessions and activities where he was counseled and learn coping skills that should help him after discharge to cope better and manage his substance abuse issues to sustain a much longer sobriety. He also attended AA/NA meetings being offered and held on this unit. She has some previously existing and or identifiable medical conditions that required treatment and or monitoring. She received medication management for all those health issues as well. She was monitored closely for any potential problems that may arise as a result of and or during detoxification treatment. Patient tolerated his treatment regimen and detoxification treatment  protocol without any significant adverse effects and or reactions presented.  Patient attended treatment team meeting this am and met with the treatment team members. His reason for admission, present symptoms, substance abuse issues, response to treatment and discharge plans discussed. Patient endorsed that he is doing well and stable for discharge to pursue the next phase  of his substance abuse treatment. It was agreed that he will continue substance abuse treatment at [facility] on 10/21/12 at 08:30 am. For the mean time, he is being discharge to the home that he shares with his girl-friend. The address, date, time and contact information for [facility] provided for patient in writing.  Besides the treatments received here and scheduled outpatient psychiatric services , patient was encouraged to join/attend AA/NA meetings offered and held within his community. He was instructed to get a trusted sponsor from the advise of others or from whomever within the AA meetings seems to make sense, and has a proven track record, and will hold him responsible for his sobriety, and both expects and insists on his total abstinence from alcohol. Patient was instructed to do the steps honestly with the trusted sponsor . He is encouraged to get obsessed with her recovery by often reminding himself of how deadly this dredged horrible disease of addiction just is. And must focus the first of each month on speaker meetings where he will specifically look at how her life has been wrecked by drugs/alcohol and or how her life has been similar to that of the speaker's life.   It was agreed upon between patient and the team that he will be discharged to her home that he shares with girlfriend.  Upon discharge, patient adamantly denies suicidal, homicidal ideations, auditory, visual hallucinations, delusional thougts and or withdrawal symptoms. Patient left [facility] with all personal belongings in no apparent distress. he received 2 weeks worth samples of his discharge medications provided by [facility] pharmacy. Transportation per family.    Consults:  psychiatry  Significant Diagnostic Studies:  labs: CBC with diff, CMP, UDS, Toxicology tests, UA, Depakote levels  Discharge Vitals:   Blood pressure 94/67, pulse 94, temperature 96.9 F (36.1 C), temperature source Oral, resp. rate 20,  height 5\' 8"  (1.727 m), weight 63.05 kg (139 lb). Body mass index is 21.14 kg/(m^2). Lab Results:   Results for orders placed during the hospital encounter of 10/04/12 (from the past 72 hour(s))  VALPROIC ACID LEVEL     Status: None   Collection Time    10/09/12  6:53 AM      Result Value Range   Valproic Acid Lvl 62.7  50.0 - 100.0 ug/mL    Physical Findings: AIMS: Facial and Oral Movements Muscles of Facial Expression: None, normal Lips and Perioral Area: None, normal Jaw: None, normal Tongue: None, normal,Extremity Movements Upper (arms, wrists, hands, fingers): None, normal Lower (legs, knees, ankles, toes): None, normal, Trunk Movements Neck, shoulders, hips: None, normal, Overall Severity Severity of abnormal movements (highest score from questions above): None, normal Incapacitation due to abnormal movements: None, normal Patient's awareness of abnormal movements (rate only patient's report): No Awareness, Dental Status Current problems with teeth and/or dentures?: Yes Does patient usually wear dentures?: No  CIWA:  CIWA-Ar Total: 0 COWS:     Psychiatric Specialty Exam: See Psychiatric Specialty Exam and Suicide Risk Assessment completed by Attending Physician prior to discharge.  Discharge destination:  Home, then to [facility] residential 10/21/12  Is patient on multiple antipsychotic therapies at discharge:  No  Has Patient had three or more failed trials of antipsychotic monotherapy by history:  No  Recommended Plan for Multiple Antipsychotic Therapies: NA     Medication List    STOP taking these medications       folic acid 1 MG tablet  Commonly known as:  FOLVITE     thiamine 100 MG tablet      TAKE these medications     Indication   divalproex 500 MG 24 hr tablet  Commonly known as:  DEPAKOTE ER  Take 2 tablets (1,000 mg total) by mouth at bedtime. For mood stabilization   Indication:  Depressive Phase of Manic-Depression, Manic Phase of  Manic-Depression     doxycycline 100 MG tablet  Commonly known as:  VIBRA-TABS  Take 1 tablet (100 mg total) by mouth every 12 (twelve) hours. For infection   Indication:  Tooth infection     gabapentin 300 MG capsule  Commonly known as:  NEURONTIN  Take 1 capsule (300 mg total) by mouth 3 (three) times daily. For anxiety/pain control   Indication:  Nerve Pain After Herpes Zoster or Shingles, Anxiety     traZODone 50 MG tablet  Commonly known as:  DESYREL  Take 1 tablet (50 mg total) by mouth at bedtime and may repeat dose one time if needed. For sleep   Indication:  Trouble Sleeping, Major Depressive Disorder       Follow-up Information   Follow up with [facility] On 10/21/2012. (Be at [facility] with month supply of medications before 8:30 AM morning of May 1, for admit to treatment bed. )    Contact information:   [address] [place] [address] Ph [phone] Fax: [phone]     Follow-up recommendations: Activity:  As tolerated Diet: As recommended by your primary care doctor. Keep all scheduled follow-up appointments as recommended.   Continue to work the relapse prevention plan Comments:  Take all your medications as prescribed by your mental healthcare provider. Report any adverse effects and or reactions from your medicines to your outpatient provider promptly. Patient is instructed and cautioned to not engage in alcohol and or illegal drug use while on prescription medicines. In the event of worsening symptoms, patient is instructed to call the crisis hotline, 911 and or go to the nearest ED for appropriate evaluation and treatment of symptoms. Follow-up with your primary care provider for your other medical issues, concerns and or health care needs.   Total Discharge Time:  Greater than 30 minutes.  SignedArmandina Stammer I 10/11/2012, 9:33 AM

## 2012-10-11 NOTE — Progress Notes (Signed)
[facility] Adult Case Management Discharge Plan :  Will you be returning to the same living situation after discharge: Yes,  with roommate At discharge, do you have transportation home?:Yes,  same Do you have the ability to pay for your medications:Yes,  through [facility]  Release of information consent forms completed and in the chart;  Patient's signature needed at discharge.  Patient to Follow up at: Follow-up Information   Follow up with [facility] On 10/21/2012. (Be at [facility] with month supply of medications before 8:30 AM morning of May 1, for admit to treatment bed. )    Contact information:   [address] [place] [address] Ph [phone] Fax: [phone]      Follow up with [facility] On 10/14/2012. (Go to walk in clinic on Thursday between 8 AM and 11:00 AM for after hospital visit assessment; they will then set you up for a follow up appointment. )    Contact information:   [address] [place] [address] [facility] Fax [phone]      Patient denies SI/HI:   Yes,  denies both     Safety Planning and Suicide Prevention discussed:  Yes,  with Georgette Shell 10/11/2012, 5:32 PM

## 2012-10-11 NOTE — Progress Notes (Signed)
Adult Psychoeducational Group Note  Date:  10/11/2012 Time:  12:59 PM  Group Topic/Focus:  Wellness Toolbox:   The focus of this group is to discuss various aspects of wellness, balancing those aspects and exploring ways to increase the ability to experience wellness.  Patients will create a wellness toolbox for use upon discharge.  Participation Level:  Active  Participation Quality:  Appropriate, Attentive and Sharing  Affect:  Appropriate  Cognitive:  Appropriate  Insight: Appropriate  Engagement in Group:  Engaged  Modes of Intervention:  Discussion  Additional Comments:  Pt was appropriate and attentive while attending group. Pt spoke of his girlfriend being supportive and would help keep him focus once leaving facility. Pt also stated that wellness meant that he was getting better.   Sharyn Lull 10/11/2012, 12:59 PM

## 2012-10-11 NOTE — Progress Notes (Signed)
Interdisciplinary Treatment Plan Update (Adult)  Date: 10/11/2012  Time Reviewed: 9:48 AM   Progress in Treatment: Attending groups: Yes Participating in groups: Yes Taking medication as prescribed:  Yes Tolerating medication:  Yes Family/Significant othe contact made: Yes Patient understands diagnosis: Yes Discussing patient identified problems/goals with staff: Yes Medical problems stabilized or resolved:  Yes Denies suicidal/homicidal ideation: Yes Patient has not harmed self or Others: Yes  New problem(s) identified: None Identified  Discharge Plan or Barriers:  [facility] referred [facility] management and has May 1 admit date at [facility].   Additional comments: N/A  Reason for Continuation of Hospitalization: NA  Estimated length of stay: Discharge today  For review of initial/current patient goals, please see plan of care.  Attendees: Patient:     Family:     Physician:  Geoffery Lyons 10/11/2012 9:48 AM   Nursing:   Robbie Louis, RN 10/11/2012 9:48 AM   Clinical Social Worker Ronda Fairly 10/11/2012 9:48 AM   Other:  Scherry Ran Psych Intern 10/11/2012 9:48 AM   Other:  Serena Colonel, NP 10/11/2012 9:48 AM   Other:   10/11/2012 9:48 AM   Other:   10/11/2012 9:48 AM    Scribe for Treatment Team:   Carney Bern, LCSWA  10/11/2012 9:48 AM

## 2012-10-11 NOTE — BHH Group Notes (Signed)
[facility] LCSW Aftercare Discharge Planning Group Note   10/11/2012  8:45 AM  Participation Quality:  Appropriate  Mood/Affect:  Appropriate.    Depression Rating:  5  Anxiety Rating:  2  Thoughts of Suicide:  No Will you contract for safety?   NA  Current AVH:  No  Plan for Discharge/Comments:  Patient to discharge home, follow up at [facility] for medication and [facility] for SA treatment on 5/1. Patient will also follow up with dentist to have tooth pulled. Reports pain from tooth at a 8.  Transportation Means:  Dinah Beers  Supports: Roommate and family in [place]  [place], Julious Payer

## 2012-10-11 NOTE — BHH Suicide Risk Assessment (Signed)
Suicide Risk Assessment  Discharge Assessment     Demographic Factors:  Male and Unemployed  Mental Status Per Nursing Assessment::   On Admission:  NA (Pt denies SI at this time)  Current Mental Status by Physician: In full contact with reality. There are no suicidal ideas, plans or intent. His mood is euthymic, his affect is appropriate. He is going to be admitted to [facility] on May 1. Meanwhile he is going to stay with his friend, pursue outpatient follow up and take care of his teeth. He states he is committed to abstinence   Loss Factors: NA  Historical Factors: NA  Risk Reduction Factors:   Positive social support  Continued Clinical Symptoms:  Alcohol/Substance Abuse/Dependencies, Mood Disorder  Cognitive Features That Contribute To Risk:  Closed-mindedness Thought constriction (tunnel vision)    Suicide Risk:  Minimal: No identifiable suicidal ideation.  Patients presenting with no risk factors but with morbid ruminations; may be classified as minimal risk based on the severity of the depressive symptoms  Discharge Diagnoses:   AXIS I:  Opioid , Alcohol Dependence, Unspecified episodic mood disorder AXIS II:  Deferred AXIS III:   Past Medical History  Diagnosis Date  . Hepatitis     hep b and c  . Ruptured spleen   . Pain in thoracic spine   . Chronic pain syndrome   . Depression    AXIS IV:  other psychosocial or environmental problems AXIS V:  61-70 mild symptoms  Plan Of Care/Follow-up recommendations:  Activity:  as tolerated Diet:  regular Follow up [facility] (May 1)  Is patient on multiple antipsychotic therapies at discharge:  No   Has Patient had three or more failed trials of antipsychotic monotherapy by history:  No  Recommended Plan for Multiple Antipsychotic Therapies: N/A   Kialee Kham A 10/11/2012, 12:45 PM

## 2012-10-14 NOTE — Progress Notes (Signed)
Patient Discharge Instructions:  After Visit Summary (AVS):   Faxed to:  10/14/12 Discharge Summary Note:   Faxed to:  10/14/12 Psychiatric Admission Assessment Note:   Faxed to:  10/14/12 Suicide Risk Assessment - Discharge Assessment:   Faxed to:  10/14/12 Faxed/Sent to the Next Level Care provider:  10/14/12 Faxed to [facility] @ [phone] Faxed to [facility] @ [phone]  Jerelene Redden, 10/14/2012, 4:08 PM

## 2013-01-05 ENCOUNTER — Encounter (HOSPITAL_COMMUNITY): Payer: Self-pay | Admitting: Emergency Medicine

## 2013-01-05 ENCOUNTER — Emergency Department (HOSPITAL_COMMUNITY)
Admission: EM | Admit: 2013-01-05 | Discharge: 2013-01-05 | Disposition: A | Discharge status: Home / Self Care | Payer: Self-pay | Attending: Emergency Medicine | Admitting: Emergency Medicine

## 2013-01-05 ENCOUNTER — Emergency Department (HOSPITAL_COMMUNITY): Payer: Self-pay

## 2013-01-05 DIAGNOSIS — S81009A Unspecified open wound, unspecified knee, initial encounter: Secondary | ICD-10-CM | POA: Insufficient documentation

## 2013-01-05 DIAGNOSIS — F3289 Other specified depressive episodes: Secondary | ICD-10-CM | POA: Insufficient documentation

## 2013-01-05 DIAGNOSIS — Y9289 Other specified places as the place of occurrence of the external cause: Secondary | ICD-10-CM | POA: Insufficient documentation

## 2013-01-05 DIAGNOSIS — W268XXA Contact with other sharp object(s), not elsewhere classified, initial encounter: Secondary | ICD-10-CM | POA: Insufficient documentation

## 2013-01-05 DIAGNOSIS — Z23 Encounter for immunization: Secondary | ICD-10-CM | POA: Insufficient documentation

## 2013-01-05 DIAGNOSIS — G894 Chronic pain syndrome: Secondary | ICD-10-CM | POA: Insufficient documentation

## 2013-01-05 DIAGNOSIS — IMO0002 Reserved for concepts with insufficient information to code with codable children: Secondary | ICD-10-CM

## 2013-01-05 DIAGNOSIS — Z79899 Other long term (current) drug therapy: Secondary | ICD-10-CM | POA: Insufficient documentation

## 2013-01-05 DIAGNOSIS — Z8619 Personal history of other infectious and parasitic diseases: Secondary | ICD-10-CM | POA: Insufficient documentation

## 2013-01-05 DIAGNOSIS — S81809A Unspecified open wound, unspecified lower leg, initial encounter: Secondary | ICD-10-CM | POA: Insufficient documentation

## 2013-01-05 DIAGNOSIS — Y9389 Activity, other specified: Secondary | ICD-10-CM | POA: Insufficient documentation

## 2013-01-05 DIAGNOSIS — F17200 Nicotine dependence, unspecified, uncomplicated: Secondary | ICD-10-CM | POA: Insufficient documentation

## 2013-01-05 DIAGNOSIS — Z862 Personal history of diseases of the blood and blood-forming organs and certain disorders involving the immune mechanism: Secondary | ICD-10-CM | POA: Insufficient documentation

## 2013-01-05 DIAGNOSIS — Y990 Civilian activity done for income or pay: Secondary | ICD-10-CM | POA: Insufficient documentation

## 2013-01-05 DIAGNOSIS — F329 Major depressive disorder, single episode, unspecified: Secondary | ICD-10-CM | POA: Insufficient documentation

## 2013-01-05 MED ORDER — TETANUS-DIPHTH-ACELL PERTUSSIS 5-2.5-18.5 LF-MCG/0.5 IM SUSP
0.5000 mL | Freq: Once | INTRAMUSCULAR | Status: AC
  Administered 2013-01-05: 0.5 mL via INTRAMUSCULAR
  Filled 2013-01-05: qty 0.5

## 2013-01-05 NOTE — ED Notes (Signed)
Pt comfortable with d/c and f/u instructions. No Prescriptions. 

## 2013-01-05 NOTE — ED Notes (Signed)
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  Bleeding controlled at this time.

## 2013-01-05 NOTE — ED Notes (Signed)
Wound care done

## 2013-01-05 NOTE — ED Notes (Signed)
Wound irrigated and sutured by Santina Evans PA. Pt tolerated well.

## 2013-01-06 NOTE — ED Provider Notes (Signed)
History    CSN: [id number] Arrival date & time 01/05/13  2053  First MD Initiated Contact with Patient 01/05/13 2201     Chief Complaint  Patient presents with  . Extremity Laceration   (Consider location/radiation/quality/duration/timing/severity/associated sxs/prior Treatment) HPI History provided by pt.   Pt presents w/ lac to proximal, medial L lower leg that he sustained while moving sheet metal at work, just pta.  C/o severe pain.  Hemostatic.  Has not cleaned.  No associated sx. Last tetanus unknown. Past Medical History  Diagnosis Date  . Hepatitis     hep b and c  . Ruptured spleen   . Pain in thoracic spine   . Chronic pain syndrome   . Depression    Past Surgical History  Procedure Laterality Date  . No past surgeries      spleen removed 5 yrs ago  . Spllenectomy     No family history on file. History  Substance Use Topics  . Smoking status: Current Every Day Smoker -- 1.00 packs/day for 15 years    Types: Cigarettes  . Smokeless tobacco: Not on file  . Alcohol Use: 50.4 oz/week    84 Cans of beer per week    Review of Systems  All other systems reviewed and are negative.    Allergies  Fish allergy and Sulfa antibiotics  Home Medications   Current Outpatient Rx  Name  Route  Sig  Dispense  Refill  . divalproex (DEPAKOTE ER) 500 MG 24 hr tablet   Oral   Take 2 tablets (1,000 mg total) by mouth at bedtime. For mood stabilization   60 tablet   0   . gabapentin (NEURONTIN) 300 MG capsule   Oral   Take 1 capsule (300 mg total) by mouth 3 (three) times daily. For anxiety/pain control   90 capsule   0   . omeprazole (PRILOSEC OTC) 20 MG tablet   Oral   Take 20 mg by mouth daily.         . ramipril (ALTACE) 2.5 MG capsule   Oral   Take 2.5 mg by mouth daily.         . traZODone (DESYREL) 50 MG tablet   Oral   Take 1 tablet (50 mg total) by mouth at bedtime and may repeat dose one time if needed. For sleep   60 tablet   0    BP  139/97  Pulse 65  Temp(Src) 98.4 F (36.9 C) (Oral)  Resp 18  SpO2 97% Physical Exam  Nursing note and vitals reviewed. Constitutional: He is oriented to person, place, and time. He appears well-developed and well-nourished. No distress.  HENT:  Head: Normocephalic and atraumatic.  Eyes:  Normal appearance  Neck: Normal range of motion.  Pulmonary/Chest: Effort normal.  Musculoskeletal: Normal range of motion.  2.5cm superficial, hemostatic and clean lac at medial aspect L proximal lower leg.    Neurological: He is alert and oriented to person, place, and time.  Psychiatric: He has a normal mood and affect. His behavior is normal.    ED Course  Procedures (including critical care time) LACERATION REPAIR Performed by: Otilio Miu Authorized by: Ruby Cola E Consent: Verbal consent obtained. Risks and benefits: risks, benefits and alternatives were discussed Consent given by: patient Patient identity confirmed: provided demographic data Prepped and Draped in normal sterile fashion Wound explored  Laceration Location: L lower leg  Laceration Length: 2.5cm  No Foreign Bodies seen or palpated  Anesthesia: local infiltration  Local anesthetic: lidocaine 2% w/ epinephrine  Anesthetic total: 5 ml  Irrigation method: syringe Amount of cleaning: standard  Skin closure: prolene 4.0  Number of sutures: 4  Technique: simple interrupted  Patient tolerance: Patient tolerated the procedure well with no immediate complications.  Labs Reviewed - No data to display Dg Tibia/fibula Left  01/05/2013   *RADIOLOGY REPORT*  Clinical Data: Laceration from scrap metal along the medial aspect of the left leg, just below the knee.  LEFT TIBIA AND FIBULA - 2 VIEW  Comparison: None.  Findings: There is no evidence of fracture or dislocation.  The tibia and fibula appear intact.  The known soft tissue laceration is not well characterized on radiograph.  No radiopaque  foreign bodies are seen.  The knee joint is grossly unremarkable in appearance.  The ankle mortise is incompletely assessed, but appears grossly unremarkable.  IMPRESSION: No evidence of fracture or dislocation.  No radiopaque foreign bodies seen.   Original Report Authenticated By: Tonia Ghent, M.D.   1. Laceration     MDM  [phone] M presents w/ lac to left lower leg.  Cleaned by nursing staff and then sutured.  Tetanus updated.  Return precautions discussed.   Otilio Miu, PA-C 01/06/13 2124

## 2013-01-07 NOTE — ED Provider Notes (Signed)
Medical screening examination/treatment/procedure(s) were performed by non-physician practitioner and as supervising physician I was immediately available for consultation/collaboration.  Sunnie Nielsen, MD 01/07/13 [phone]

## 2013-04-19 ENCOUNTER — Emergency Department (INDEPENDENT_AMBULATORY_CARE_PROVIDER_SITE_OTHER)
Admission: EM | Admit: 2013-04-19 | Discharge: 2013-04-19 | Disposition: A | Discharge status: Home / Self Care | Payer: Self-pay | Source: Home / Self Care | Attending: Emergency Medicine | Admitting: Emergency Medicine

## 2013-04-19 ENCOUNTER — Encounter (HOSPITAL_COMMUNITY): Payer: Self-pay | Admitting: Emergency Medicine

## 2013-04-19 DIAGNOSIS — T63481A Toxic effect of venom of other arthropod, accidental (unintentional), initial encounter: Secondary | ICD-10-CM

## 2013-04-19 DIAGNOSIS — T6391XA Toxic effect of contact with unspecified venomous animal, accidental (unintentional), initial encounter: Secondary | ICD-10-CM

## 2013-04-19 MED ORDER — METHYLPREDNISOLONE ACETATE 80 MG/ML IJ SUSP
INTRAMUSCULAR | Status: AC
  Filled 2013-04-19: qty 1

## 2013-04-19 MED ORDER — EPINEPHRINE 0.3 MG/0.3ML IJ SOAJ: 0.3000 mg | Freq: Once | INTRAMUSCULAR | Status: DC

## 2013-04-19 MED ORDER — HYDROCODONE-ACETAMINOPHEN 5-325 MG PO TABS: ORAL_TABLET | ORAL | Status: DC

## 2013-04-19 MED ORDER — DIPHENHYDRAMINE HCL 50 MG/ML IJ SOLN
INTRAMUSCULAR | Status: AC
  Filled 2013-04-19: qty 1

## 2013-04-19 MED ORDER — CEPHALEXIN 500 MG PO CAPS: 500.0000 mg | ORAL_CAPSULE | Freq: Three times a day (TID) | ORAL | Status: DC

## 2013-04-19 MED ORDER — HYDROXYZINE HCL 25 MG PO TABS: 25.0000 mg | ORAL_TABLET | Freq: Four times a day (QID) | ORAL | Status: DC

## 2013-04-19 MED ORDER — DIPHENHYDRAMINE HCL 50 MG/ML IJ SOLN
25.0000 mg | Freq: Once | INTRAMUSCULAR | Status: AC
  Administered 2013-04-19: 25 mg via INTRAMUSCULAR

## 2013-04-19 MED ORDER — METHYLPREDNISOLONE ACETATE 80 MG/ML IJ SUSP
80.0000 mg | Freq: Once | INTRAMUSCULAR | Status: AC
  Administered 2013-04-19: 80 mg via INTRAMUSCULAR

## 2013-04-19 MED ORDER — PREDNISONE 20 MG PO TABS: 20.0000 mg | ORAL_TABLET | Freq: Two times a day (BID) | ORAL | Status: DC

## 2013-04-19 NOTE — ED Provider Notes (Signed)
Chief Complaint:   Chief Complaint  Patient presents with  . Insect Bite    History of Present Illness:   Antonio [name] is a 42 year old male who is in a group home and is brought in today by his group home worker. He was done yesterday in the right wrist by a [place] hornet. Ever since then the wrist, hand, and distal forearm has been swollen, red, painful, and itchy. His joints feel tight. He denies any numbness or tingling. He has a small blister on the dorsum of the wrist and he has not had any fever, chills, swelling of the lips, tongue, throat, or difficulty breathing or wheezing. He does have a history of asthma, but this has not flared up since he had the hornet sting.  Review of Systems:  Other than noted above, the patient denies any of the following symptoms: Systemic:  No fever, chills, sweats, weight loss, or fatigue. ENT:  No nasal congestion, rhinorrhea, sore throat, swelling of lips, tongue or throat. Resp:  No cough, wheezing, or shortness of breath. Skin:  No rash, itching, nodules, or suspicious lesions.  PMFSH:  Past medical history, family history, social history, meds, and allergies were reviewed. His only allergy is deficiency food. He takes anxiety medication blood pressure medicine. He has asthma, hepatitis C, hypertension, depression, and anxiety.  Physical Exam:   Vital signs:  BP 132/88  Pulse 72  Temp(Src) 98.6 F (37 C) (Oral)  Resp 18  SpO2 100% Gen:  Alert, oriented, in no distress. ENT:  Pharynx clear, no intraoral lesions, moist mucous membranes. Lungs:  He has scattered rhonchi and wheezes bilaterally. Skin:  There is swelling, erythema, and tenderness to palpation involving the right wrist, distal forearm, and the entire hand. He has a small blister on the dorsum of the wrist. There is some stiffness of the wrist and all joints of the hand. He has good capillary refill, full pulses, and normal strength and sensation in the hand.  Course in Urgent  Care Center:   He was given Depo-Medrol 80 mg IM, and Benadryl 25 mg IM.  Assessment:  The encounter diagnosis was Hymenoptera sting, initial encounter.  This is a severe local reaction, there is no evidence of systemic reaction.  Plan:   1.  Meds:  The following meds were prescribed:   New Prescriptions   CEPHALEXIN (KEFLEX) 500 MG CAPSULE    Take 1 capsule (500 mg total) by mouth 3 (three) times daily.   EPINEPHRINE (EPIPEN) 0.3 MG/0.3 ML SOAJ INJECTION    Inject 0.3 mLs (0.3 mg total) into the skin once.   HYDROCODONE-ACETAMINOPHEN (NORCO/VICODIN) 5-325 MG PER TABLET    1 to 2 tabs every 4 to 6 hours as needed for pain.   HYDROXYZINE (ATARAX/VISTARIL) 25 MG TABLET    Take 1 tablet (25 mg total) by mouth every 6 (six) hours.   PREDNISONE (DELTASONE) 20 MG TABLET    Take 1 tablet (20 mg total) by mouth 2 (two) times daily.    2.  Patient Education/Counseling:  The patient was given appropriate handouts, self care instructions, and instructed in symptomatic relief.  He is to use the EpiPen only if needed for respiratory distress.  3.  Follow up:  The patient was told to follow up if no better in 3 to 4 days, if becoming worse in any way, and given some red flag symptoms such as fever, worsening pain or swelling which would prompt immediate return.  Follow up here if necessary.  Reuben Likes, MD 04/19/13 2056

## 2013-04-19 NOTE — ED Notes (Signed)
Pt  Stung  By      By   A  Winged  Apis        yest            The  Hand  Is  Red  Swollen  Warm  To  Touch            And        painfull             No  resp  Distress     No  Angioedema     No  resp  Distress

## 2013-10-03 ENCOUNTER — Emergency Department (HOSPITAL_COMMUNITY)
Admission: EM | Admit: 2013-10-03 | Discharge: 2013-10-03 | Disposition: A | Discharge status: Home / Self Care | Payer: Self-pay | Attending: Emergency Medicine | Admitting: Emergency Medicine

## 2013-10-03 ENCOUNTER — Encounter (HOSPITAL_COMMUNITY): Payer: Self-pay | Admitting: Emergency Medicine

## 2013-10-03 ENCOUNTER — Emergency Department (HOSPITAL_COMMUNITY): Payer: Self-pay

## 2013-10-03 DIAGNOSIS — F3289 Other specified depressive episodes: Secondary | ICD-10-CM | POA: Insufficient documentation

## 2013-10-03 DIAGNOSIS — S8253XA Displaced fracture of medial malleolus of unspecified tibia, initial encounter for closed fracture: Secondary | ICD-10-CM | POA: Insufficient documentation

## 2013-10-03 DIAGNOSIS — X500XXA Overexertion from strenuous movement or load, initial encounter: Secondary | ICD-10-CM | POA: Insufficient documentation

## 2013-10-03 DIAGNOSIS — F329 Major depressive disorder, single episode, unspecified: Secondary | ICD-10-CM | POA: Insufficient documentation

## 2013-10-03 DIAGNOSIS — F17200 Nicotine dependence, unspecified, uncomplicated: Secondary | ICD-10-CM | POA: Insufficient documentation

## 2013-10-03 DIAGNOSIS — Z862 Personal history of diseases of the blood and blood-forming organs and certain disorders involving the immune mechanism: Secondary | ICD-10-CM | POA: Insufficient documentation

## 2013-10-03 DIAGNOSIS — Z79899 Other long term (current) drug therapy: Secondary | ICD-10-CM | POA: Insufficient documentation

## 2013-10-03 DIAGNOSIS — S82409A Unspecified fracture of shaft of unspecified fibula, initial encounter for closed fracture: Secondary | ICD-10-CM | POA: Insufficient documentation

## 2013-10-03 DIAGNOSIS — Y92410 Unspecified street and highway as the place of occurrence of the external cause: Secondary | ICD-10-CM | POA: Insufficient documentation

## 2013-10-03 DIAGNOSIS — Z8619 Personal history of other infectious and parasitic diseases: Secondary | ICD-10-CM | POA: Insufficient documentation

## 2013-10-03 DIAGNOSIS — G894 Chronic pain syndrome: Secondary | ICD-10-CM | POA: Insufficient documentation

## 2013-10-03 DIAGNOSIS — Y9389 Activity, other specified: Secondary | ICD-10-CM | POA: Insufficient documentation

## 2013-10-03 MED ORDER — HYDROMORPHONE HCL PF 1 MG/ML IJ SOLN
1.0000 mg | Freq: Once | INTRAMUSCULAR | Status: AC
  Administered 2013-10-03: 1 mg via INTRAMUSCULAR
  Filled 2013-10-03: qty 1

## 2013-10-03 MED ORDER — OXYCODONE-ACETAMINOPHEN 5-325 MG PO TABS
1.0000 | ORAL_TABLET | Freq: Once | ORAL | Status: AC
  Administered 2013-10-03: 1 via ORAL
  Filled 2013-10-03: qty 1

## 2013-10-03 MED ORDER — OXYCODONE-ACETAMINOPHEN 5-325 MG PO TABS: 1.0000 | ORAL_TABLET | Freq: Four times a day (QID) | ORAL | Status: DC | PRN

## 2013-10-03 NOTE — Discharge Instructions (Signed)
Cast or Splint Care Casts and splints support injured limbs and keep bones from moving while they heal. It is important to care for your cast or splint at home.  HOME CARE INSTRUCTIONS  Keep the cast or splint uncovered during the drying period. It can take 24 to 48 hours to dry if it is made of plaster. A fiberglass cast will dry in less than 1 hour.  Do not rest the cast on anything harder than a pillow for the first 24 hours.  Do not put weight on your injured limb or apply pressure to the cast until your health care provider gives you permission.  Keep the cast or splint dry. Wet casts or splints can lose their shape and may not support the limb as well. A wet cast that has lost its shape can also create harmful pressure on your skin when it dries. Also, wet skin can become infected.  Cover the cast or splint with a plastic bag when bathing or when out in the rain or snow. If the cast is on the trunk of the body, take sponge baths until the cast is removed.  If your cast does become wet, dry it with a towel or a blow dryer on the cool setting only.  Keep your cast or splint clean. Soiled casts may be wiped with a moistened cloth.  Do not place any hard or soft foreign objects under your cast or splint, such as cotton, toilet paper, lotion, or powder.  Do not try to scratch the skin under the cast with any object. The object could get stuck inside the cast. Also, scratching could lead to an infection. If itching is a problem, use a blow dryer on a cool setting to relieve discomfort.  Do not trim or cut your cast or remove padding from inside of it.  Exercise all joints next to the injury that are not immobilized by the cast or splint. For example, if you have a long leg cast, exercise the hip joint and toes. If you have an arm cast or splint, exercise the shoulder, elbow, thumb, and fingers.  Elevate your injured arm or leg on 1 or 2 pillows for the first 1 to 3 days to decrease  swelling and pain.It is best if you can comfortably elevate your cast so it is higher than your heart. SEEK MEDICAL CARE IF:   Your cast or splint cracks.  Your cast or splint is too tight or too loose.  You have unbearable itching inside the cast.  Your cast becomes wet or develops a soft spot or area.  You have a bad smell coming from inside your cast.  You get an object stuck under your cast.  Your skin around the cast becomes red or raw.  You have new pain or worsening pain after the cast has been applied. SEEK IMMEDIATE MEDICAL CARE IF:   You have fluid leaking through the cast.  You are unable to move your fingers or toes.  You have discolored (blue or white), cool, painful, or very swollen fingers or toes beyond the cast.  You have tingling or numbness around the injured area.  You have severe pain or pressure under the cast.  You have any difficulty with your breathing or have shortness of breath.  You have chest pain. Document Released: 06/06/2000 Document Revised: 03/30/2013 Document Reviewed: 12/16/2012 [facility] Patient Information 2014 [place].  Ankle Fracture A fracture is a break in the bone. A cast or splint  is used to protect and keep your injured bone from moving.  HOME CARE INSTRUCTIONS   Use your crutches as directed.  To lessen the swelling, keep the injured leg elevated while sitting or lying down.  Apply ice to the injury for 15-20 minutes, 03-04 times per day while awake for 2 days. Put the ice in a plastic bag and place a thin towel between the bag of ice and your cast.  If you have a plaster or fiberglass cast:  Do not try to scratch the skin under the cast using sharp or pointed objects.  Check the skin around the cast every day. You may put lotion on any red or sore areas.  Keep your cast dry and clean.  If you have a plaster splint:  Wear the splint as directed.  You may loosen the elastic around the splint if your toes  become numb, tingle, or turn cold or blue.  Do not put pressure on any part of your cast or splint; it may break. Rest your cast only on a pillow the first 24 hours until it is fully hardened.  Your cast or splint can be protected during bathing with a plastic bag. Do not lower the cast or splint into water.  Take medications as directed by your caregiver. Only take over-the-counter or prescription medicines for pain, discomfort, or fever as directed by your caregiver.  Do not drive a vehicle until your caregiver specifically tells you it is safe to do so.  If your caregiver has given you a follow-up appointment, it is very important to keep that appointment. Not keeping the appointment could result in a chronic or permanent injury, pain, and disability. If there is any problem keeping the appointment, you must call back to this facility for assistance. SEEK IMMEDIATE MEDICAL CARE IF:   Your cast gets damaged or breaks.  You have continued severe pain or more swelling than you did before the cast was put on.  Your skin or toenails below the injury turn blue or gray, or feel cold or numb.  There is a bad smell or new stains and/or purulent (pus like) drainage coming from under the cast. If you do not have a window in your cast for observing the wound, a discharge or minor bleeding may show up as a stain on the outside of your cast. Report these findings to your caregiver. MAKE SURE YOU:   Understand these instructions.  Will watch your condition.  Will get help right away if you are not doing well or get worse. Document Released: 06/06/2000 Document Revised: 09/01/2011 Document Reviewed: 01/06/2013 [facility] Patient Information 2014 [place], [place]. Please call Dr. Jarrett Ables office in the morning to set an appointment to be seen on Thursday.  He requested to keep her leg elevated as much as possible with ice therapy.  He does not want to put any weight on your foot.  U. been supplied  crutches.  Please use these for any ambulation if you need
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
## 2013-10-03 NOTE — Progress Notes (Signed)
Orthopedic Tech Progress Note Patient Details:  Antonio [name] [birth year] [id number]  Ortho Devices Type of Ortho Device: Ace wrap;Post (short leg) splint;Stirrup splint;Crutches Ortho Device/Splint Location: LLE Ortho Device/Splint Interventions: Ordered;Application   [name] 10/03/2013, 10:53 PM

## 2013-10-04 NOTE — ED Provider Notes (Signed)
Medical screening examination/treatment/procedure(s) were performed by non-physician practitioner and as supervising physician I was immediately available for consultation/collaboration.   EKG Interpretation None        Ephraim Hamburger, MD 10/04/13 1525

## 2013-10-05 ENCOUNTER — Encounter (HOSPITAL_BASED_OUTPATIENT_CLINIC_OR_DEPARTMENT_OTHER): Payer: Self-pay | Admitting: *Deleted

## 2013-10-05 NOTE — Progress Notes (Signed)
Pt alcoholic and hx drugs-lives with sponsor-has not been doing drugs-drinks-hx hep c Will need istat

## 2013-10-05 NOTE — Progress Notes (Signed)
Reviewed with dr ossey-did not need urine drug screen

## 2013-10-05 NOTE — H&P (Signed)
ORTHOPAEDIC CONSULTATION  REQUESTING PHYSICIAN: Renette Butters, MD  Chief Complaint: Left ankle fracture  HPI: Antonio [name] is a 43 y.o. male who complains of  Left leg pain  Past Medical History  Diagnosis Date  . Hepatitis     hep b and c  . Ruptured spleen   . Pain in thoracic spine   . Chronic pain syndrome   . Depression   . Alcoholic   . Complication of anesthesia     hard to wake up   Past Surgical History  Procedure Laterality Date  . No past surgeries      spleen removed 5 yrs ago  . Spllenectomy     History   Social History  . Marital Status: Single    Spouse Name: N/A    Number of Children: N/A  . Years of Education: N/A   Social History Main Topics  . Smoking status: Current Every Day Smoker -- 1.00 packs/day for 15 years    Types: Cigarettes  . Smokeless tobacco: None  . Alcohol Use: 50.4 oz/week    84 Cans of beer per week  . Drug Use: 28.00 per week    Special: Hydrocodone, Heroin     Comment: heroin  . Sexual Activity: Not Currently   Other Topics Concern  . None   Social History Narrative  . None   History reviewed. No pertinent family history. Allergies  Allergen Reactions  . Fish Allergy Itching and Swelling  . Sulfa Antibiotics Swelling  . Bee Venom Swelling   Prior to Admission medications   Medication Sig Start Date End Date Taking? Authorizing Provider  methadone (DOLOPHINE) 10 MG tablet Take 10 mg by mouth daily.   Yes Historical Provider, MD  gabapentin (NEURONTIN) 300 MG capsule Take 1 capsule (300 mg total) by mouth 3 (three) times daily. For anxiety/pain control 10/11/12   Encarnacion Slates, NP  oxyCODONE-acetaminophen (PERCOCET/ROXICET) 5-325 MG per tablet Take 1-2 tablets by mouth every 6 (six) hours as needed for severe pain. 10/03/13   Garald Balding, NP  ramipril (ALTACE) 2.5 MG capsule Take 2.5 mg by mouth daily.    Historical Provider, MD   Dg Tibia/fibula Left  10/03/2013   CLINICAL DATA:  Traumatic  injury and pain  EXAM: LEFT TIBIA AND FIBULA - 2 VIEW  COMPARISON:  None.  FINDINGS: Comminuted fracture of the midshaft of the left fibula is noted. Additionally there is a fracture through the medial malleolus. Soft tissue swelling is noted about the ankle joint.  IMPRESSION: Midshaft fibular and distal tibial fractures as described.   Electronically Signed   By: Inez Catalina M.D.   On: 10/03/2013 21:52   Dg Foot Complete Left  10/03/2013   CLINICAL DATA:  Left foot pain following injury  EXAM: LEFT FOOT - COMPLETE 3+ VIEW  COMPARISON:  None.  FINDINGS: Transverse fracture through the medial malleolus is again identified. The bony structures of foot are otherwise within normal limits. No gross soft tissue abnormality is seen aside from that surrounding the ankle.  IMPRESSION: Distal tibial fracture.   Electronically Signed   By: Inez Catalina M.D.   On: 10/03/2013 21:53    Positive ROS: All other systems have been reviewed and were otherwise negative with the exception of those mentioned in the HPI and as above.  Labs cbc No results found for this basename: WBC, HGB, HCT, PLT,  in the last 72 hours  Labs inflam No results found  for this basename: ESR, CRP,  in the last 72 hours  Labs coag No results found for this basename: INR, PT, PTT,  in the last 72 hours  No results found for this basename: NA, K, CL, CO2, GLUCOSE, BUN, CREATININE, CALCIUM,  in the last 72 hours  Physical Exam: There were no vitals filed for this visit. General: Alert, no acute distress Cardiovascular: No pedal edema Respiratory: No cyanosis, no use of accessory musculature GI: No organomegaly, abdomen is soft and non-tender Skin: No lesions in the area of chief complaint Neurologic: Sensation intact distally Psychiatric: Patient is competent for consent with normal mood and affect Lymphatic: No axillary or cervical lymphadenopathy  MUSCULOSKELETAL:  Skin intact, NVI, splint intact Other extremities are  atraumatic with painless ROM and NVI.  Assessment: Left ankle fracture  Plan: ORIF of medial mal +/- syndesmotic fixation   Renette Butters, MD Cell [phone]   10/05/2013 1:55 PM

## 2013-10-06 ENCOUNTER — Encounter (HOSPITAL_BASED_OUTPATIENT_CLINIC_OR_DEPARTMENT_OTHER): Payer: Self-pay | Admitting: Anesthesiology

## 2013-10-06 ENCOUNTER — Ambulatory Visit (HOSPITAL_BASED_OUTPATIENT_CLINIC_OR_DEPARTMENT_OTHER): Payer: Self-pay | Admitting: Anesthesiology

## 2013-10-06 ENCOUNTER — Ambulatory Visit (HOSPITAL_BASED_OUTPATIENT_CLINIC_OR_DEPARTMENT_OTHER)
Admission: RE | Admit: 2013-10-06 | Discharge: 2013-10-06 | Disposition: A | Discharge status: Home / Self Care | Payer: Self-pay | Source: Ambulatory Visit | Attending: Orthopedic Surgery | Admitting: Orthopedic Surgery

## 2013-10-06 ENCOUNTER — Encounter (HOSPITAL_BASED_OUTPATIENT_CLINIC_OR_DEPARTMENT_OTHER)
Admission: RE | Disposition: A | Discharge status: Home / Self Care | Payer: Self-pay | Source: Ambulatory Visit | Attending: Orthopedic Surgery

## 2013-10-06 DIAGNOSIS — F17200 Nicotine dependence, unspecified, uncomplicated: Secondary | ICD-10-CM | POA: Insufficient documentation

## 2013-10-06 DIAGNOSIS — B1920 Unspecified viral hepatitis C without hepatic coma: Secondary | ICD-10-CM | POA: Insufficient documentation

## 2013-10-06 DIAGNOSIS — M546 Pain in thoracic spine: Secondary | ICD-10-CM | POA: Insufficient documentation

## 2013-10-06 DIAGNOSIS — Z882 Allergy status to sulfonamides status: Secondary | ICD-10-CM | POA: Insufficient documentation

## 2013-10-06 DIAGNOSIS — S8253XA Displaced fracture of medial malleolus of unspecified tibia, initial encounter for closed fracture: Secondary | ICD-10-CM | POA: Insufficient documentation

## 2013-10-06 DIAGNOSIS — Z91018 Allergy to other foods: Secondary | ICD-10-CM | POA: Insufficient documentation

## 2013-10-06 DIAGNOSIS — F329 Major depressive disorder, single episode, unspecified: Secondary | ICD-10-CM | POA: Insufficient documentation

## 2013-10-06 DIAGNOSIS — S93439A Sprain of tibiofibular ligament of unspecified ankle, initial encounter: Secondary | ICD-10-CM | POA: Insufficient documentation

## 2013-10-06 DIAGNOSIS — B1910 Unspecified viral hepatitis B without hepatic coma: Secondary | ICD-10-CM | POA: Insufficient documentation

## 2013-10-06 DIAGNOSIS — Z79899 Other long term (current) drug therapy: Secondary | ICD-10-CM | POA: Insufficient documentation

## 2013-10-06 DIAGNOSIS — F1911 Other psychoactive substance abuse, in remission: Secondary | ICD-10-CM | POA: Insufficient documentation

## 2013-10-06 DIAGNOSIS — S82409A Unspecified fracture of shaft of unspecified fibula, initial encounter for closed fracture: Secondary | ICD-10-CM | POA: Insufficient documentation

## 2013-10-06 DIAGNOSIS — Z91038 Other insect allergy status: Secondary | ICD-10-CM | POA: Insufficient documentation

## 2013-10-06 DIAGNOSIS — G894 Chronic pain syndrome: Secondary | ICD-10-CM | POA: Insufficient documentation

## 2013-10-06 DIAGNOSIS — F3289 Other specified depressive episodes: Secondary | ICD-10-CM | POA: Insufficient documentation

## 2013-10-06 DIAGNOSIS — F1020 Alcohol dependence, uncomplicated: Secondary | ICD-10-CM | POA: Insufficient documentation

## 2013-10-06 HISTORY — DX: Other complications of anesthesia, initial encounter: T88.59XA

## 2013-10-06 HISTORY — PX: ORIF ANKLE FRACTURE: SHX5408

## 2013-10-06 HISTORY — DX: Alcohol dependence, uncomplicated: F10.20

## 2013-10-06 HISTORY — DX: Adverse effect of unspecified anesthetic, initial encounter: T41.45XA

## 2013-10-06 LAB — POCT I-STAT, CHEM 8
BUN: 10 mg/dL (ref 6–23)
CREATININE: 0.6 mg/dL (ref 0.50–1.35)
Calcium, Ion: 1.17 mmol/L (ref 1.12–1.23)
Chloride: 104 mEq/L (ref 96–112)
Glucose, Bld: 111 mg/dL — ABNORMAL HIGH (ref 70–99)
HCT: 44 % (ref 39.0–52.0)
HEMOGLOBIN: 15 g/dL (ref 13.0–17.0)
POTASSIUM: 3.9 meq/L (ref 3.7–5.3)
SODIUM: 141 meq/L (ref 137–147)
TCO2: 26 mmol/L (ref 0–100)

## 2013-10-06 SURGERY — OPEN REDUCTION INTERNAL FIXATION (ORIF) ANKLE FRACTURE
Anesthesia: Regional | Site: Ankle | Laterality: Left

## 2013-10-06 MED ORDER — FENTANYL CITRATE 0.05 MG/ML IJ SOLN
50.0000 ug | INTRAMUSCULAR | Status: DC | PRN
  Administered 2013-10-06: 100 ug via INTRAVENOUS

## 2013-10-06 MED ORDER — ONDANSETRON HCL 4 MG PO TABS: 4.0000 mg | ORAL_TABLET | Freq: Three times a day (TID) | ORAL | Status: DC | PRN

## 2013-10-06 MED ORDER — FENTANYL CITRATE 0.05 MG/ML IJ SOLN
INTRAMUSCULAR | Status: AC
Start: 2013-10-06 — End: 2013-10-06
  Filled 2013-10-06: qty 6

## 2013-10-06 MED ORDER — DEXAMETHASONE SODIUM PHOSPHATE 4 MG/ML IJ SOLN
INTRAMUSCULAR | Status: DC | PRN
  Administered 2013-10-06: 10 mg via INTRAVENOUS

## 2013-10-06 MED ORDER — PROPOFOL 10 MG/ML IV BOLUS
INTRAVENOUS | Status: AC
  Filled 2013-10-06: qty 20

## 2013-10-06 MED ORDER — BUPIVACAINE HCL (PF) 0.5 % IJ SOLN
INTRAMUSCULAR | Status: AC
  Filled 2013-10-06: qty 60

## 2013-10-06 MED ORDER — MIDAZOLAM HCL 2 MG/2ML IJ SOLN
INTRAMUSCULAR | Status: AC
  Filled 2013-10-06: qty 2

## 2013-10-06 MED ORDER — DEXTROSE-NACL 5-0.45 % IV SOLN
100.0000 mL/h | INTRAVENOUS | Status: DC
Start: 2013-10-06 — End: 2013-10-06

## 2013-10-06 MED ORDER — BUPIVACAINE-EPINEPHRINE PF 0.5-1:200000 % IJ SOLN
INTRAMUSCULAR | Status: DC | PRN
  Administered 2013-10-06: 30 mL via PERINEURAL

## 2013-10-06 MED ORDER — CEFAZOLIN SODIUM-DEXTROSE 2-3 GM-% IV SOLR
INTRAVENOUS | Status: AC
  Filled 2013-10-06: qty 50

## 2013-10-06 MED ORDER — OXYCODONE HCL 5 MG PO TABS: 10.0000 mg | ORAL_TABLET | ORAL | Status: DC | PRN

## 2013-10-06 MED ORDER — HYDROMORPHONE HCL 2 MG PO TABS: 2.0000 mg | ORAL_TABLET | ORAL | Status: DC | PRN

## 2013-10-06 MED ORDER — OXYCODONE HCL 5 MG/5ML PO SOLN: 5.0000 mg | Freq: Once | ORAL | Status: DC | PRN

## 2013-10-06 MED ORDER — CEFAZOLIN SODIUM-DEXTROSE 2-3 GM-% IV SOLR
2.0000 g | INTRAVENOUS | Status: AC
  Administered 2013-10-06: 2 g via INTRAVENOUS

## 2013-10-06 MED ORDER — LIDOCAINE HCL (CARDIAC) 20 MG/ML IV SOLN
INTRAVENOUS | Status: DC | PRN
  Administered 2013-10-06: 80 mg via INTRAVENOUS

## 2013-10-06 MED ORDER — LACTATED RINGERS IV SOLN
INTRAVENOUS | Status: DC
  Administered 2013-10-06 (×2): via INTRAVENOUS

## 2013-10-06 MED ORDER — OXYCODONE HCL 5 MG PO TABS: 5.0000 mg | ORAL_TABLET | Freq: Once | ORAL | Status: DC | PRN

## 2013-10-06 MED ORDER — FENTANYL CITRATE 0.05 MG/ML IJ SOLN
INTRAMUSCULAR | Status: DC | PRN
  Administered 2013-10-06: 25 ug via INTRAVENOUS
  Administered 2013-10-06: 50 ug via INTRAVENOUS
  Administered 2013-10-06: 25 ug via INTRAVENOUS
  Administered 2013-10-06 (×2): 50 ug via INTRAVENOUS
  Administered 2013-10-06: 100 ug via INTRAVENOUS

## 2013-10-06 MED ORDER — DOCUSATE SODIUM 100 MG PO CAPS: 100.0000 mg | ORAL_CAPSULE | Freq: Two times a day (BID) | ORAL | Status: DC

## 2013-10-06 MED ORDER — BUPIVACAINE HCL (PF) 0.5 % IJ SOLN
INTRAMUSCULAR | Status: DC | PRN
  Administered 2013-10-06: 10 mL

## 2013-10-06 MED ORDER — ONDANSETRON HCL 4 MG/2ML IJ SOLN
INTRAMUSCULAR | Status: DC | PRN
  Administered 2013-10-06: 4 mg via INTRAVENOUS

## 2013-10-06 MED ORDER — ASPIRIN 81 MG PO TABS: 81.0000 mg | ORAL_TABLET | Freq: Every day | ORAL | Status: DC

## 2013-10-06 MED ORDER — HYDROMORPHONE HCL PF 1 MG/ML IJ SOLN: 0.2500 mg | INTRAMUSCULAR | Status: DC | PRN

## 2013-10-06 MED ORDER — FENTANYL CITRATE 0.05 MG/ML IJ SOLN
INTRAMUSCULAR | Status: AC
  Filled 2013-10-06: qty 2

## 2013-10-06 MED ORDER — PROPOFOL 10 MG/ML IV BOLUS
INTRAVENOUS | Status: DC | PRN
  Administered 2013-10-06: 200 mg via INTRAVENOUS
  Administered 2013-10-06: 50 mg via INTRAVENOUS

## 2013-10-06 MED ORDER — ACETAMINOPHEN 500 MG PO TABS
ORAL_TABLET | ORAL | Status: AC
  Filled 2013-10-06: qty 2

## 2013-10-06 MED ORDER — ACETAMINOPHEN 500 MG PO TABS
1000.0000 mg | ORAL_TABLET | Freq: Once | ORAL | Status: DC
Start: 2013-10-06 — End: 2013-10-06

## 2013-10-06 MED ORDER — MIDAZOLAM HCL 2 MG/2ML IJ SOLN
1.0000 mg | INTRAMUSCULAR | Status: DC | PRN
  Administered 2013-10-06: 2 mg via INTRAVENOUS

## 2013-10-06 SURGICAL SUPPLY — 68 items
BANDAGE ELASTIC 4 VELCRO ST LF (GAUZE/BANDAGES/DRESSINGS) ×3 IMPLANT
BANDAGE ELASTIC 6 VELCRO ST LF (GAUZE/BANDAGES/DRESSINGS) ×3 IMPLANT
BANDAGE ESMARK 6X9 LF (GAUZE/BANDAGES/DRESSINGS) ×1 IMPLANT
BIT DRILL CANN 2.7 (BIT) ×1
BIT DRILL CANN 2.7MM (BIT) ×1
BIT DRILL SRG 2.7XCANN AO CPLG (BIT) ×1 IMPLANT
BIT DRL SRG 2.7XCANN AO CPLNG (BIT) ×1
BLADE SURG 15 STRL LF DISP TIS (BLADE) ×2 IMPLANT
BLADE SURG 15 STRL SS (BLADE) ×4
BNDG COHESIVE 4X5 TAN STRL (GAUZE/BANDAGES/DRESSINGS) ×3 IMPLANT
BNDG ESMARK 6X9 LF (GAUZE/BANDAGES/DRESSINGS) ×3
CHLORAPREP W/TINT 26ML (MISCELLANEOUS) ×3 IMPLANT
CLOSURE WOUND 1/2 X4 (GAUZE/BANDAGES/DRESSINGS) ×1
COVER MAYO STAND STRL (DRAPES) ×3 IMPLANT
COVER TABLE BACK 60X90 (DRAPES) ×3 IMPLANT
CUFF TOURNIQUET SINGLE 24IN (TOURNIQUET CUFF) IMPLANT
CUFF TOURNIQUET SINGLE 34IN LL (TOURNIQUET CUFF) ×3 IMPLANT
DECANTER SPIKE VIAL GLASS SM (MISCELLANEOUS) IMPLANT
DRAPE C-ARM 42X72 X-RAY (DRAPES) IMPLANT
DRAPE C-ARMOR (DRAPES) IMPLANT
DRAPE EXTREMITY T 121X128X90 (DRAPE) ×3 IMPLANT
DRAPE OEC MINIVIEW 54X84 (DRAPES) ×3 IMPLANT
DRAPE U 20/CS (DRAPES) ×3 IMPLANT
DRAPE U-SHAPE 47X51 STRL (DRAPES) ×3 IMPLANT
DRSG EMULSION OIL 3X3 NADH (GAUZE/BANDAGES/DRESSINGS) ×3 IMPLANT
ELECT REM PT RETURN 9FT ADLT (ELECTROSURGICAL) ×3
ELECTRODE REM PT RTRN 9FT ADLT (ELECTROSURGICAL) ×1 IMPLANT
GLOVE BIO SURGEON STRL SZ7.5 (GLOVE) ×6 IMPLANT
GLOVE BIO SURGEON STRL SZ8 (GLOVE) ×3 IMPLANT
GLOVE BIOGEL PI IND STRL 7.0 (GLOVE) ×1 IMPLANT
GLOVE BIOGEL PI IND STRL 8 (GLOVE) ×2 IMPLANT
GLOVE BIOGEL PI INDICATOR 7.0 (GLOVE) ×2
GLOVE BIOGEL PI INDICATOR 8 (GLOVE) ×4
GOWN STRL REUS W/ TWL LRG LVL3 (GOWN DISPOSABLE) ×4 IMPLANT
GOWN STRL REUS W/ TWL XL LVL3 (GOWN DISPOSABLE) ×1 IMPLANT
GOWN STRL REUS W/TWL LRG LVL3 (GOWN DISPOSABLE) ×8
GOWN STRL REUS W/TWL XL LVL3 (GOWN DISPOSABLE) ×2
K-WIRE ORTHOPEDIC 1.4X150L (WIRE) ×6
KWIRE ORTHOPEDIC 1.4X150L (WIRE) ×2 IMPLANT
NEEDLE HYPO 22GX1.5 SAFETY (NEEDLE) ×3 IMPLANT
NS IRRIG 1000ML POUR BTL (IV SOLUTION) ×3 IMPLANT
PACK BASIN DAY SURGERY FS (CUSTOM PROCEDURE TRAY) ×3 IMPLANT
PAD CAST 4YDX4 CTTN HI CHSV (CAST SUPPLIES) ×1 IMPLANT
PADDING CAST COTTON 4X4 STRL (CAST SUPPLIES) ×2
PADDING CAST COTTON 6X4 STRL (CAST SUPPLIES) ×3 IMPLANT
PENCIL BUTTON HOLSTER BLD 10FT (ELECTRODE) ×3 IMPLANT
REPAIR TROPE KNTLS SS SYNDESMO (Orthopedic Implant) ×3 IMPLANT
SCREW CANN 44X15X4XSLF DRL (Screw) ×2 IMPLANT
SCREW CANNULATED 4.0X44MM (Screw) ×4 IMPLANT
SLEEVE SCD COMPRESS KNEE MED (MISCELLANEOUS) IMPLANT
SPLINT FAST PLASTER 5X30 (CAST SUPPLIES)
SPLINT PLASTER CAST FAST 5X30 (CAST SUPPLIES) IMPLANT
SPONGE GAUZE 4X4 12PLY (GAUZE/BANDAGES/DRESSINGS) ×3 IMPLANT
SPONGE LAP 4X18 X RAY DECT (DISPOSABLE) ×3 IMPLANT
STRIP CLOSURE SKIN 1/2X4 (GAUZE/BANDAGES/DRESSINGS) ×2 IMPLANT
SUCTION FRAZIER TIP 10 FR DISP (SUCTIONS) IMPLANT
SUT MON AB 2-0 CT1 36 (SUTURE) ×6 IMPLANT
SUT MON AB 4-0 PC3 18 (SUTURE) ×3 IMPLANT
SUT VIC AB 0 SH 27 (SUTURE) IMPLANT
SUT VIC AB 2-0 SH 27 (SUTURE)
SUT VIC AB 2-0 SH 27XBRD (SUTURE) IMPLANT
SYR 20CC LL (SYRINGE) ×3 IMPLANT
SYR BULB 3OZ (MISCELLANEOUS) ×3 IMPLANT
TOWEL OR 17X24 6PK STRL BLUE (TOWEL DISPOSABLE) ×6 IMPLANT
TOWEL OR NON WOVEN STRL DISP B (DISPOSABLE) ×3 IMPLANT
TUBE CONNECTING 20'X1/4 (TUBING) ×1
TUBE CONNECTING 20X1/4 (TUBING) ×2 IMPLANT
UNDERPAD 30X30 INCONTINENT (UNDERPADS AND DIAPERS) ×3 IMPLANT

## 2013-10-06 NOTE — Op Note (Signed)
10/06/2013  8:21 AM  PATIENT:  Antonio [name]    PRE-OPERATIVE DIAGNOSIS:  LEFT ANKLE FRACTURE BILMALLEOLAR -CLOSED,  FRACTURE ANKLE UNSPECIFIED-CLOSED   POST-OPERATIVE DIAGNOSIS:  Same  PROCEDURE:  LEFT ANKLE FRACTURE OPEN TREATMENT BILMALLEOLAR ANKLE INCLUDES INTERNAL FIXATION, LEFT ANKLE FRACTURE OPEN TREATMENT DISTAL TIBIOFIBULAR INCLUDES INTERNAL FIXATION   SURGEON:  Renette Butters, MD  ASSISTANT: Joya Gaskins OPA  ANESTHESIA:   gen  PREOPERATIVE INDICATIONS:  [name] is a  43 y.o. male with a diagnosis of LEFT ANKLE FRACTURE BILMALLEOLAR -CLOSED,  FRACTURE ANKLE UNSPECIFIED-CLOSED  who failed conservative measures and elected for surgical management.    The risks benefits and alternatives were discussed with the patient preoperatively including but not limited to the risks of infection, bleeding, nerve injury, cardiopulmonary complications, the need for revision surgery, among others, and the patient was willing to proceed.  OPERATIVE IMPLANTS: Tight rope, canulated screws (stryker)  OPERATIVE FINDINGS: Unstable ankle fracture. Stable syndesmosis post op  BLOOD LOSS: min  COMPLICATIONS: none  TOURNIQUET TIME: 77min  OPERATIVE PROCEDURE:  Patient was identified in the preoperative holding area and site was marked by me He was transported to the operating theater and placed on the table in supine position taking care to pad all bony prominences. After a preincinduction time out anesthesia was induced. The left lower extremity was prepped and draped in normal sterile fashion and a pre-incision timeout was performed. Antonio [name] received ancef for preoperative antibiotics.   Exam his fracture fluoroscopically his syndesmosis was injured with his high fibula fracture and he had is obvious medial Mal fracture I elected to fix the medial Mal first and then stressed and again appears stable I would slowly do the tight rope without requiring fixation of the  proximal fibula. Treating it effectively like a Mason new fracture.  I then turned my attention medially where I created a 4 cm incision and dissected sharply down to the medial Mal fracture taking care to protect the saphenous vein. I debrided the fracture and reduced and held in place with a tenaculum. I then drilled and placed 2 partially threaded 45 mm cannulated screws one anterior and one posterior across the fracture.  I then stressed the syndesmosis and for syndesmotic fixation I placed a tight rope under fluoroscopic guidance just above the physeal scar.  The wound was then thoroughly irrigated and closed using a 0 Vicryl and absorbable Monocryl sutures. He was placed in a short leg splint.   POST OPERATIVE PLAN: Non-weightbearing. DVT prophylaxis will consist of foot pumps and early mobilization with ASA81mg  daily

## 2013-10-06 NOTE — Anesthesia Postprocedure Evaluation (Signed)
  Anesthesia Post-op Note  Patient: Antonio [name]  Procedure(s) Performed: Procedure(s): LEFT ANKLE FRACTURE OPEN TREATMENT BILMALLEOLAR ANKLE INCLUDES INTERNAL FIXATION, LEFT ANKLE FRACTURE OPEN TREATMENT DISTAL TIBIOFIBULAR INCLUDES INTERNAL FIXATION  (Left)  Patient Location: PACU  Anesthesia Type:General and block  Level of Consciousness: awake and alert   Airway and Oxygen Therapy: Patient Spontanous Breathing  Post-op Pain: none  Post-op Assessment: Post-op Vital signs reviewed, Patient's Cardiovascular Status Stable and Respiratory Function Stable  Post-op Vital Signs: Reviewed  Filed Vitals:   10/06/13 0915  BP: 122/73  Pulse: 71  Temp:   Resp: 16    Complications: No apparent anesthesia complications

## 2013-10-06 NOTE — Progress Notes (Signed)
Assisted Dr. Fitzgerald with left, ultrasound guided, popliteal/saphenous block. Side rails up, monitors on throughout procedure. See vital signs in flow sheet. Tolerated Procedure well. 

## 2013-10-06 NOTE — Interval H&P Note (Signed)
History and Physical Interval Note:  10/06/2013 7:21 AM  Antonio [name]  has presented today for surgery, with the diagnosis of LEFT ANKLE FRACTURE BILMALLEOLAR -CLOSED,  FRACTURE ANKLE UNSPECIFIED-CLOSED   The various methods of treatment have been discussed with the patient and family. After consideration of risks, benefits and other options for treatment, the patient has consented to  Procedure(s): LEFT ANKLE FRACTURE OPEN TREATMENT BILMALLEOLAR ANKLE INCLUDES INTERNAL FIXATION, LEFT ANKLE FRACTURE OPEN TREATMENT DISTAL TIBIOFIBULAR INCLUDES INTERNAL FIXATION  (Left) as a surgical intervention .  The patient's history has been reviewed, patient examined, no change in status, stable for surgery.  I have reviewed the patient's chart and labs.  Questions were answered to the patient's satisfaction.     Renette Butters

## 2013-10-06 NOTE — Transfer of Care (Signed)
Immediate Anesthesia Transfer of Care Note  Patient: Antonio [name]  Procedure(s) Performed: Procedure(s): LEFT ANKLE FRACTURE OPEN TREATMENT BILMALLEOLAR ANKLE INCLUDES INTERNAL FIXATION, LEFT ANKLE FRACTURE OPEN TREATMENT DISTAL TIBIOFIBULAR INCLUDES INTERNAL FIXATION  (Left)  Patient Location: PACU  Anesthesia Type:GA combined with regional for post-op pain  Level of Consciousness: awake, sedated and patient cooperative  Airway & Oxygen Therapy: Patient Spontanous Breathing and Patient connected to face mask oxygen  Post-op Assessment: Report given to PACU RN and Post -op Vital signs reviewed and stable  Post vital signs: Reviewed and stable  Complications: No apparent anesthesia complications

## 2013-10-06 NOTE — Discharge Instructions (Signed)
Elevate leg as much as possible  No weight on Left leg  Take Aspirin 81mg  daily  Use oxycodone for pain and dilaudid for severe pain   Post Anesthesia Home Care Instructions  Activity: Get plenty of rest for the remainder of the day. A responsible adult should stay with you for 24 hours following the procedure.  For the next 24 hours, DO NOT: -Drive a car -Paediatric nurse -Drink alcoholic beverages -Take any medication unless instructed by your physician -Make any legal decisions or sign important papers.  Meals: Start with liquid foods such as gelatin or soup. Progress to regular foods as tolerated. Avoid greasy, spicy, heavy foods. If nausea and/or vomiting occur, drink only clear liquids until the nausea and/or vomiting subsides. Call your physician if vomiting continues.  Special Instructions/Symptoms: Your throat may feel dry or sore from the anesthesia or the breathing tube placed in your throat during surgery. If this causes discomfort, gargle with warm salt water. The discomfort should disappear within 24 hours.   Regional Anesthesia Blocks  1. Numbness or the inability to move the "blocked" extremity may last from 3-48 hours after placement. The length of time depends on the medication injected and your individual response to the medication. If the numbness is not going away after 48 hours, call your surgeon.  2. The extremity that is blocked will need to be protected until the numbness is gone and the  Strength has returned. Because you cannot feel it, you will need to take extra care to avoid injury. Because it may be weak, you may have difficulty moving it or using it. You may not know what position it is in without looking at it while the block is in effect.  3. For blocks in the legs and feet, returning to weight bearing and walking needs to be done carefully. You will need to wait until the numbness is entirely gone and the strength has returned. You should be able  to move your leg and foot normally before you try and bear weight or walk. You will need someone to be with you when you first try to ensure you do not fall and possibly risk injury.  4. Bruising and tenderness at the needle site are common side effects and will resolve in a few days.  5. Persistent numbness or new problems with movement should be communicated to the surgeon or the [place] [phone] [place] [phone]).

## 2013-10-06 NOTE — Anesthesia Procedure Notes (Addendum)
Anesthesia Regional Block:  Popliteal block  Pre-Anesthetic Checklist: ,, timeout performed, Correct Patient, Correct Site, Correct Laterality, Correct Procedure, Correct Position, site marked, Risks and benefits discussed, pre-op evaluation, post-op pain management  Laterality: Left  Prep: Maximum Sterile Barrier Precautions used and chloraprep       Needles:  Injection technique: Single-shot  Needle Type: Echogenic Stimulator Needle     Needle Length: 9cm 9 cm Needle Gauge: 21 and 21 G    Additional Needles:  Procedures: ultrasound guided (picture in chart) and nerve stimulator Popliteal block  Nerve Stimulator or Paresthesia:  Response: Peroneal,  Response: Tibial,   Additional Responses:   Narrative:  Start time: 10/06/2013 7:20 AM End time: 10/06/2013 7:30 AM Injection made incrementally with aspirations every 5 mL. Anesthesiologist: Ola Spurr, MD  Additional Notes: 2% Lidocaine skin wheel. Saphenous block with 10cc of 0.5% Bupivicaine plain.   Procedure Name: LMA Insertion Date/Time: 10/06/2013 7:44 AM Performed by: Lyndee Leo Pre-anesthesia Checklist: Patient identified, Emergency Drugs available, Suction available and Patient being monitored Patient Re-evaluated:Patient Re-evaluated prior to inductionOxygen Delivery Method: Circle System Utilized Preoxygenation: Pre-oxygenation with 100% oxygen Intubation Type: IV induction Ventilation: Mask ventilation without difficulty LMA: LMA inserted LMA Size: 4.0 Number of attempts: 1 Airway Equipment and Method: bite block Placement Confirmation: positive ETCO2 Tube secured with: Tape Dental Injury: Teeth and Oropharynx as per pre-operative assessment

## 2013-10-06 NOTE — Anesthesia Preprocedure Evaluation (Signed)
Anesthesia Evaluation  Patient identified by MRN, date of birth, ID band Patient awake    Reviewed: Allergy & Precautions, H&P , NPO status , Patient's Chart, lab work & pertinent test results  Airway Mallampati: II TM Distance: >3 FB Neck ROM: Full    Dental no notable dental hx. (+) Teeth Intact, Dental Advisory Given   Pulmonary Current Smoker,  breath sounds clear to auscultation  Pulmonary exam normal       Cardiovascular negative cardio ROS  Rhythm:Regular Rate:Normal     Neuro/Psych negative neurological ROS  negative psych ROS   GI/Hepatic negative GI ROS, (+)     substance abuse  IV drug use, Hepatitis -H/o heroin use   Endo/Other  negative endocrine ROS  Renal/GU negative Renal ROS  negative genitourinary   Musculoskeletal   Abdominal   Peds  Hematology negative hematology ROS (+)   Anesthesia Other Findings   Reproductive/Obstetrics negative OB ROS                           Anesthesia Physical Anesthesia Plan  ASA: II  Anesthesia Plan: General and Regional   Post-op Pain Management:    Induction: Intravenous  Airway Management Planned: LMA  Additional Equipment:   Intra-op Plan:   Post-operative Plan: Extubation in OR  Informed Consent: I have reviewed the patients History and Physical, chart, labs and discussed the procedure including the risks, benefits and alternatives for the proposed anesthesia with the patient or authorized representative who has indicated his/her understanding and acceptance.   Dental advisory given  Plan Discussed with: CRNA  Anesthesia Plan Comments:         Anesthesia Quick Evaluation

## 2013-10-10 ENCOUNTER — Encounter (HOSPITAL_BASED_OUTPATIENT_CLINIC_OR_DEPARTMENT_OTHER): Payer: Self-pay | Admitting: Orthopedic Surgery

## 2014-01-10 ENCOUNTER — Emergency Department (HOSPITAL_COMMUNITY)
Admission: EM | Admit: 2014-01-10 | Discharge: 2014-01-10 | Discharge status: Against Medical Advice | Payer: Self-pay | Attending: Emergency Medicine | Admitting: Emergency Medicine

## 2014-01-10 ENCOUNTER — Encounter (HOSPITAL_COMMUNITY): Payer: Self-pay | Admitting: Emergency Medicine

## 2014-01-10 DIAGNOSIS — R079 Chest pain, unspecified: Secondary | ICD-10-CM | POA: Insufficient documentation

## 2014-01-10 LAB — CBC WITH DIFFERENTIAL/PLATELET
BASOS PCT: 1 % (ref 0–1)
Basophils Absolute: 0.1 10*3/uL (ref 0.0–0.1)
EOS PCT: 3 % (ref 0–5)
Eosinophils Absolute: 0.2 10*3/uL (ref 0.0–0.7)
HEMATOCRIT: 38.2 % — AB (ref 39.0–52.0)
HEMOGLOBIN: 13 g/dL (ref 13.0–17.0)
LYMPHS ABS: 3.1 10*3/uL (ref 0.7–4.0)
Lymphocytes Relative: 53 % — ABNORMAL HIGH (ref 12–46)
MCH: 32.2 pg (ref 26.0–34.0)
MCHC: 34 g/dL (ref 30.0–36.0)
MCV: 94.6 fL (ref 78.0–100.0)
MONO ABS: 0.9 10*3/uL (ref 0.1–1.0)
Monocytes Relative: 15 % — ABNORMAL HIGH (ref 3–12)
Neutro Abs: 1.7 10*3/uL (ref 1.7–7.7)
Neutrophils Relative %: 28 % — ABNORMAL LOW (ref 43–77)
Platelets: 84 10*3/uL — ABNORMAL LOW (ref 150–400)
RBC: 4.04 MIL/uL — AB (ref 4.22–5.81)
RDW: 16.2 % — ABNORMAL HIGH (ref 11.5–15.5)
WBC: 5.9 10*3/uL (ref 4.0–10.5)

## 2014-01-10 LAB — COMPREHENSIVE METABOLIC PANEL
ALBUMIN: 3.4 g/dL — AB (ref 3.5–5.2)
ALK PHOS: 191 U/L — AB (ref 39–117)
ALT: 124 U/L — ABNORMAL HIGH (ref 0–53)
ANION GAP: 12 (ref 5–15)
AST: 230 U/L — ABNORMAL HIGH (ref 0–37)
BUN: 9 mg/dL (ref 6–23)
CALCIUM: 8.3 mg/dL — AB (ref 8.4–10.5)
CO2: 21 mEq/L (ref 19–32)
CREATININE: 0.59 mg/dL (ref 0.50–1.35)
Chloride: 109 mEq/L (ref 96–112)
GFR calc non Af Amer: >90 mL/min (ref >90)
GLUCOSE: 97 mg/dL (ref 70–99)
POTASSIUM: 3.9 meq/L (ref 3.7–5.3)
Sodium: 142 mEq/L (ref 137–147)
TOTAL PROTEIN: 8 g/dL (ref 6.0–8.3)
Total Bilirubin: 0.5 mg/dL (ref 0.3–1.2)

## 2014-01-10 LAB — TROPONIN I

## 2014-01-10 NOTE — ED Notes (Addendum)
Pt states that he is having abdominal pain that radiates to his chest then to his head. Pt states that the pain goes up his chest and across his forehead. Pt family states that his veins were also bounding in his arms at the time of the CP/HA.  Pt admits to 1 beer tonight (pt states 1 beer not the whole pack, pt history of 84 beers)

## 2014-06-20 ENCOUNTER — Emergency Department (HOSPITAL_COMMUNITY): Payer: Self-pay

## 2014-06-20 ENCOUNTER — Encounter (HOSPITAL_COMMUNITY): Payer: Self-pay | Admitting: Emergency Medicine

## 2014-06-20 ENCOUNTER — Emergency Department (HOSPITAL_COMMUNITY)
Admission: EM | Admit: 2014-06-20 | Discharge: 2014-06-21 | Disposition: A | Discharge status: Home / Self Care | Payer: Self-pay | Attending: Emergency Medicine | Admitting: Emergency Medicine

## 2014-06-20 DIAGNOSIS — X58XXXA Exposure to other specified factors, initial encounter: Secondary | ICD-10-CM | POA: Insufficient documentation

## 2014-06-20 DIAGNOSIS — Z8719 Personal history of other diseases of the digestive system: Secondary | ICD-10-CM | POA: Insufficient documentation

## 2014-06-20 DIAGNOSIS — G894 Chronic pain syndrome: Secondary | ICD-10-CM | POA: Insufficient documentation

## 2014-06-20 DIAGNOSIS — M25572 Pain in left ankle and joints of left foot: Secondary | ICD-10-CM

## 2014-06-20 DIAGNOSIS — Y998 Other external cause status: Secondary | ICD-10-CM | POA: Insufficient documentation

## 2014-06-20 DIAGNOSIS — Z862 Personal history of diseases of the blood and blood-forming organs and certain disorders involving the immune mechanism: Secondary | ICD-10-CM | POA: Insufficient documentation

## 2014-06-20 DIAGNOSIS — Y9389 Activity, other specified: Secondary | ICD-10-CM | POA: Insufficient documentation

## 2014-06-20 DIAGNOSIS — S99912A Unspecified injury of left ankle, initial encounter: Secondary | ICD-10-CM | POA: Insufficient documentation

## 2014-06-20 DIAGNOSIS — Z79899 Other long term (current) drug therapy: Secondary | ICD-10-CM | POA: Insufficient documentation

## 2014-06-20 DIAGNOSIS — Z8659 Personal history of other mental and behavioral disorders: Secondary | ICD-10-CM | POA: Insufficient documentation

## 2014-06-20 DIAGNOSIS — Z720 Tobacco use: Secondary | ICD-10-CM | POA: Insufficient documentation

## 2014-06-20 DIAGNOSIS — Z7982 Long term (current) use of aspirin: Secondary | ICD-10-CM | POA: Insufficient documentation

## 2014-06-20 DIAGNOSIS — S99922A Unspecified injury of left foot, initial encounter: Secondary | ICD-10-CM | POA: Insufficient documentation

## 2014-06-20 DIAGNOSIS — Y9289 Other specified places as the place of occurrence of the external cause: Secondary | ICD-10-CM | POA: Insufficient documentation

## 2014-06-20 MED ORDER — IBUPROFEN 800 MG PO TABS: 800.0000 mg | ORAL_TABLET | Freq: Three times a day (TID) | ORAL | Status: DC | PRN

## 2014-06-20 MED ORDER — IBUPROFEN 400 MG PO TABS
800.0000 mg | ORAL_TABLET | Freq: Once | ORAL | Status: AC
  Administered 2014-06-21: 800 mg via ORAL
  Filled 2014-06-20: qty 2

## 2014-06-20 NOTE — Discharge Instructions (Signed)
Read the information below.  Use the prescribed medication as directed.  Please discuss all new medications with your pharmacist.  You may return to the Emergency Department at any time for worsening condition or any new symptoms that concern you.  If you develop uncontrolled pain, weakness or numbness of the extremity, severe discoloration of the skin, or you are unable to move your ankle or walk, return to the ER for a recheck.       Ankle Pain Ankle pain is a common symptom. The bones, cartilage, tendons, and muscles of the ankle joint perform a lot of work each day. The ankle joint holds your body weight and allows you to move around. Ankle pain can occur on either side or back of 1 or both ankles. Ankle pain may be sharp and burning or dull and aching. There may be tenderness, stiffness, redness, or warmth around the ankle. The pain occurs more often when a person walks or puts pressure on the ankle. CAUSES  There are many reasons ankle pain can develop. It is important to work with your caregiver to identify the cause since many conditions can impact the bones, cartilage, muscles, and tendons. Causes for ankle pain include:  Injury, including a break (fracture), sprain, or strain often due to a fall, sports, or a high-impact activity.  Swelling (inflammation) of a tendon (tendonitis).  Achilles tendon rupture.  Ankle instability after repeated sprains and strains.  Poor foot alignment.  Pressure on a nerve (tarsal tunnel syndrome).  Arthritis in the ankle or the lining of the ankle.  Crystal formation in the ankle (gout or pseudogout). DIAGNOSIS  A diagnosis is based on your medical history, your symptoms, results of your physical exam, and results of diagnostic tests. Diagnostic tests may include X-ray exams or a computerized magnetic scan (magnetic resonance imaging, MRI). TREATMENT  Treatment will depend on the cause of your ankle pain and may include:  Keeping pressure off the  ankle and limiting activities.  Using crutches or other walking support (a cane or brace).  Using rest, ice, compression, and elevation.  Participating in physical therapy or home exercises.  Wearing shoe inserts or special shoes.  Losing weight.  Taking medications to reduce pain or swelling or receiving an injection.  Undergoing surgery. HOME CARE INSTRUCTIONS   Only take over-the-counter or prescription medicines for pain, discomfort, or fever as directed by your caregiver.  Put ice on the injured area.  Put ice in a plastic bag.  Place a towel between your skin and the bag.  Leave the ice on for 15-20 minutes at a time, 03-04 times a day.  Keep your leg raised (elevated) when possible to lessen swelling.  Avoid activities that cause ankle pain.  Follow specific exercises as directed by your caregiver.  Record how often you have ankle pain, the location of the pain, and what it feels like. This information may be helpful to you and your caregiver.  Ask your caregiver about returning to work or sports and whether you should drive.  Follow up with your caregiver for further examination, therapy, or testing as directed. SEEK MEDICAL CARE IF:   Pain or swelling continues or worsens beyond 1 week.  You have an oral temperature above 102 F (38.9 C).  You are feeling unwell or have chills.  You are having an increasingly difficult time with walking.  You have loss of sensation or other new symptoms.  You have questions or concerns. MAKE SURE YOU:  Understand these instructions.  Will watch your condition.  Will get help right away if you are not doing well or get worse. Document Released: 11/27/2009 Document Revised: 09/01/2011 Document Reviewed: 11/27/2009 [facility] Patient Information 2015 [place], [place]. This information is not intended to replace advice given to you by your health care provider. Make sure you discuss any questions you have with your  health care provider.   Emergency Department Resource Guide 1) Find a Doctor and Pay Out of Pocket Although you won't have to find out who is covered by your insurance plan, it is a good idea to ask around and get recommendations. You will then need to call the office and see if the doctor you have chosen will accept you as a new patient and what types of options they offer for patients who are self-pay. Some doctors offer discounts or will set up payment plans for their patients who do not have insurance, but you will need to ask so you aren't surprised when you get to your appointment.  2) Contact Your Local Health Department Not all health departments have doctors that can see patients for sick visits, but many do, so it is worth a call to see if yours does. If you don't know where your local health department is, you can check in your phone book. The CDC also has a tool to help you locate your state's health department, and many state websites also have listings of all of their local health departments.  3) Find a [facility] If your illness is not likely to be very severe or complicated, you may want to try a walk in clinic. These are popping up all over the country in pharmacies, drugstores, and shopping centers. They're usually staffed by nurse practitioners or physician assistants that have been trained to treat common illnesses and complaints. They're usually fairly quick and inexpensive. However, if you have serious medical issues or chronic medical problems, these are probably not your best option.  No Primary Care Doctor: - Call Health Connect at  [phone] - they can help you locate a primary care doctor that  accepts your insurance, provides certain services, etc. - Physician Referral Service- [phone]  Chronic Pain Problems: Organization         Address  Phone   Notes  [facility]  [phone] Patients need to be referred by their primary care doctor.    Medication Assistance: Organization         Address  Phone   Notes  [facility] Medication [facility] [address] [place], [place]  [address] [phone] --Must be a resident of [facility] -- Must have NO insurance coverage whatsoever (no Medicaid/ Medicare, etc.) -- The pt. MUST have a primary care doctor that directs their care regularly and follows them in the community   [facility]  [phone]   [employer]  [phone]    Agencies that provide inexpensive medical care: Organization         Address  Phone   Notes  [place]  [phone]   [facility]    [phone]   [facility] [place], [place] [address] [phone]   [place] [phone]   Planned Parenthood    [phone]   [facility]    [phone]   [facility]  [address], [place] Phone:  ([phone]) [phone], Fax:  [phone] Hours of Operation:  9 am - 6 pm, M-F.  Also accepts Medicaid/Medicare and self-pay.  °[facility] ° [address], [place] Phone: ([phone]) [phone], Fax: [phone]. Hours of Operation:  8:30 am - 5:30 pm, M-F.  Also accepts Medicaid and self-pay.  °[facility] [place] [address], [place] Phone: ([phone]) [phone]   °[facility] [address], [place], [place] (336)[phone], Ext. 123 Mondays & Thursdays: 7-9 AM.  First 15 patients are seen on a first come, first serve basis. °  ° °Medicaid-accepting [place] Providers: ° °Organization         Address  Phone   Notes  °[facility] [address], [place] ([phone]) [phone] Also accepts self-pay patients.  °[facility] [address], [place] ° ([phone]) [phone]   °[facility] [address], [place] ([phone]) [phone]   °[facility] [address] [place] [address], [place] (336) [phone]   °Veita Bland [address], [place]  ° ([phone]) [phone] Only accepts [place] Access Medicaid patients after they have their name applied to their card.  ° °Self-Pay (no insurance) in [place]: ° °Organization         Address  Phone   Notes  °Sickle Cell Patients, [facility] [address], [place] ([phone]) [phone]   °[facility] [address], [place] (336) [phone]   °[facility] ° [address], [place] (336) [phone]   °[facility]/Dr. Osei-Bonsu ° [address] [place] [address], [place] or [address], [place] ([phone]) [phone] Phone number for both [place] and [place] locations is the same.  °[facility] [address], [place] ([phone]) [phone]   °[facility] [address] [place] [address], [place] or [address] (336) [phone] °(336) [phone]   °[facility] [address], [place] (336) [phone], phone; ([phone]) [phone], fax Sees patients 1st and 3rd Saturday of every month.  Must not qualify for public or private insurance (i.e. Medicaid, Medicare, [place] Health Choice, Veterans' Benefits) • Household income should be no more than 200% of the poverty level •The clinic cannot treat you if you are pregnant or think you are pregnant • Sexually transmitted diseases are not treated at the clinic.  ° ° °Dental Care: °Organization         Address  Phone  Notes  °[facility] [facility] [address], [place] ([phone]) [phone] Accepts children up to age 21 who are enrolled in Medicaid or [place] Health Choice; pregnant women with a Medicaid card; and children who have applied for Medicaid or [place] Health Choice, but were declined, whose parents can pay a reduced fee at time of service.    °[facility] [place]  [address], [place] (336) [phone] Accepts children up to age 21 who are enrolled in Medicaid or [place] Health Choice; pregnant women with a Medicaid card; and children who have applied for Medicaid or [place] Health Choice, but were declined, whose parents can pay a reduced fee at time of service.  °[facility] ° [address], [place] ([phone])   [phone] Patients are seen by appointment only. Walk-ins are not accepted. [facility] will see patients 18 years of age and older. °Monday - Tuesday (8am-5pm) °Most Wednesdays (8:30-5pm) °$30 per visit, cash only  °[facility] ° [address], [place] ([phone]) [phone] Patients are seen by appointment only. Walk-ins are not accepted. [facility] will see patients 18 years of age and older. °One Wednesday Evening (Monthly: Volunteer Based).  $30 per visit, cash only  °[facility]  ([phone]) [phone] for adults; Children under age 4, call Graduate Pediatric Dentistry at ([phone]. Children aged 4-14, please call [phone]) [phone] to request a pediatric application. ° Dental services are provided in all areas of dental care including fillings, crowns and bridges, complete and partial dentures, implants, gum treatment, root canals, and extractions. Preventive care is also provided. Treatment is provided to both adults and children. °Patients are selected via a lottery and there is often a waiting list. °  °[facility] [address], °[place] ° (336) 763-8833 [email or web address] °  °[facility] [address], [place], [place] (336)723-1848, Ext. 123 Second and Fourth Thursday of each month, opens at 6:30 AM; Clinic ends at 9 AM.  Patients are seen on a first-come first-served basis, and a limited number are seen during each clinic.  ° °[facility] ° [address], [place], [place] (336)  [phone]   Eligibility Requirements °You must have lived in [place], [place], or [place] counties for at least the last three months. °  You cannot be eligible for state or federal sponsored healthcare insurance, including Veterans Administration, Medicaid, or Medicare. °  You generally cannot be eligible for healthcare insurance through your employer.  °  How to apply: °Eligibility screenings are held every Tuesday and Wednesday afternoon from 1:00 pm until 4:00 pm. You do not need an appointment for the interview!  °[facility] [address], [place], [place] 336-631-2330   °[place] [facility]  336-342-8273   °[facility]  336-703-3100   °[facility]  336-570-6415   ° °Behavioral Health Resources in the Community: °Intensive Outpatient Programs °Organization         Address  Phone  Notes  °[facility] [address], [place], [place] 336-878-6098   °[facility] [address], [place], [place] 336-832-9800   °[facility] [address], [place], [place] ° 336-882-2125   °[facility] [address],  °[place], [place] 1-800-853-5163 or 336-641-4981   °Substance Abuse Resources °Organization         Address  Phone  Notes  °[facility]  336-882-2125   °[facility]  336-784-9470   °[facility]  336-285-9073   °[facility]  336-845-3988   °Residential & Outpatient Substance Abuse Program  1-800-659-3381   °Psychological Services °Organization         Address  Phone  Notes  °[facility]  336- 832-9600   °[facility]  336- 378-7881   °[facility] [address], [place] 1-800-853-5163 or 336-641-4981   ° °Mobile Crisis Teams °Organization         Address  Phone  Notes  °[facility], Mobile Crisis Care Unit  1-877-626-1772   °Assertive °[facility] ° [address] [place], [place] 336-834-9664   °Sharon DeEsch [address] °[place] 336-554-5454   ° °Self-Help/Support Groups °Organization         Address  Phone             Notes  °[facility] [place] - variety of support groups  336- 373-1402 Call for more information  °Narcotics Anonymous (NA), [facility] [address], °[place]  2 meetings at this location  ° °Residential Treatment Programs °Organization         Address  Phone  Notes  °[facility] [address],    °[place]  1-866-801-8205   °[facility] ° [address], [place], [place] 704-293-8524   °[facility] [address], [place] 336-845-3988 Admissions: 8am-3pm M-F  °[facility] [address]    °[place], [place] 336-841-1104   °[facility] [address], [place], [place] 336-379-7146   °[facility] [address]  °[place], [place] 336-285-9073   °[facility] - Intensive Outpatient [address], [place], [place] 336-852-3033   °[facility] [address]  °[place], [place] 1-877-615-2722 or 336-784-9470   °[facility] [address] [place], [place] 336-227-7417 Accepts Medicaid  °[facility] [address]  °[place] 1-800-659-3381 Substance Abuse/Addiction Treatment  ° °[place] Behavioral Health Resources °Organization         Address  Phone  Notes  °[facility]  (888) 581-9988   °Julie Brannon, PhD [address] [place], [place]   (336) 349-5553 or (336) 951-0000   °[facility]   [address] °[place], [place] (336) 349-4454   °[facility] [address], [place], [place] (336) 342-8316 Insurance/Medicaid/sponsorship through [facility]  °[facility] [address]                                    [place], [place] (336) 342-8316  Therapy/tele-psych/case  °[facility] [address]  ° [place], [place] (336) 349-2233    °Dr. Arfeen  (336) 349-4544   °[facility] [place]  [employer] [place] [facility] 1) [address],  °2) [address], [place] °3)  [address], [place] (336) 349-3220 °(336) 342-7768 ° °(336) 342-8140   °[place] Child Abuse Hotline (336) 342-1394 or (336) 342-3537 (After Hours)    ° ° ° ° °

## 2014-06-20 NOTE — ED Notes (Addendum)
Pt present to ED for left posterior ankle pain, pt states he twisted his ankle tonight, hx of screws to injured extremity. Pulses present, limited ROM due to pain. No reddness or abnormal swelling noted to ankle.

## 2014-06-20 NOTE — ED Provider Notes (Signed)
CSN: [id number]     Arrival date & time 06/20/14  2241 History  This chart was scribed for non-physician practitioner, Clayton Bibles, PA-C, working with Orlie Dakin, MD, by Delphia Grates, ED Scribe. This patient was seen in room TR04C/TR04C and the patient's care was started at 11:47 PM.   Chief Complaint  Patient presents with  . Foot Pain    The history is provided by the patient. No language interpreter was used.   HPI Comments: Antonio [name] is a 43 y.o. male who presents to the Emergency Department complaining of sudden onset constant, 10/10 left foot pain that began approximately 6 hours ago. Patient reports he slid on the floor, twisting his ankle. There is associated left ankle pain. Patient has history of ankle fracture with screws in place and is concerned he may be damaged the hardware. Pain is worse when ambulating. He denies any other injuries, numbness/weakness, fever, abdominal pain, nausea, vomiting, diarrhea.   Past Medical History  Diagnosis Date  . Hepatitis     hep b and c  . Ruptured spleen   . Pain in thoracic spine   . Chronic pain syndrome   . Depression   . Alcoholic   . Complication of anesthesia     hard to wake up   Past Surgical History  Procedure Laterality Date  . No past surgeries      spleen removed 5 yrs ago  . Spllenectomy    . Orif ankle fracture Left 10/06/2013    Procedure: LEFT ANKLE FRACTURE OPEN TREATMENT BILMALLEOLAR ANKLE INCLUDES INTERNAL FIXATION, LEFT ANKLE FRACTURE OPEN TREATMENT DISTAL TIBIOFIBULAR INCLUDES INTERNAL FIXATION ;  Surgeon: Renette Butters, MD;  Location: [facility];  Service: Orthopedics;  Laterality: Left;   History reviewed. No pertinent family history. History  Substance Use Topics  . Smoking status: Current Every Day Smoker -- 1.00 packs/day for 15 years    Types: Cigarettes  . Smokeless tobacco: Not on file  . Alcohol Use: 50.4 oz/week    84 Cans of beer per week    Review of Systems   Constitutional: Negative for fever.  Gastrointestinal: Negative for nausea, vomiting, abdominal pain and diarrhea.  Musculoskeletal: Positive for myalgias (left foot), arthralgias (left ankle) and gait problem. Negative for joint swelling.  Skin: Negative for color change and wound.  Neurological: Negative for weakness and numbness.  Psychiatric/Behavioral: Negative for self-injury.      Allergies  Fish allergy; Sulfa antibiotics; and Bee venom  Home Medications   Prior to Admission medications   Medication Sig Start Date End Date Taking? Authorizing Provider  aspirin 81 MG tablet Take 1 tablet (81 mg total) by mouth daily. 10/06/13   Renette Butters, MD  docusate sodium (COLACE) 100 MG capsule Take 1 capsule (100 mg total) by mouth 2 (two) times daily. Continue this while taking narcotics to help with bowel movements 10/06/13   Renette Butters, MD  gabapentin (NEURONTIN) 300 MG capsule Take 1 capsule (300 mg total) by mouth 3 (three) times daily. For anxiety/pain control 10/11/12   Encarnacion Slates, NP  HYDROmorphone (DILAUDID) 2 MG tablet Take 1 tablet (2 mg total) by mouth every 4 (four) hours as needed for severe pain. 10/06/13   Renette Butters, MD  methadone (DOLOPHINE) 10 MG tablet Take 10 mg by mouth daily.    Historical Provider, MD  ondansetron (ZOFRAN) 4 MG tablet Take 1 tablet (4 mg total) by mouth every 8 (eight) hours as needed  for nausea. 10/06/13   Renette Butters, MD  oxyCODONE (ROXICODONE) 5 MG immediate release tablet Take 2 tablets (10 mg total) by mouth every 4 (four) hours as needed for severe pain. 10/06/13   Renette Butters, MD  ramipril (ALTACE) 2.5 MG capsule Take 2.5 mg by mouth daily.    Historical Provider, MD   Triage Vitals: BP 141/91 mmHg  Pulse 90  Temp(Src) 98.3 F (36.8 C) (Oral)  SpO2 98%  Physical Exam  Constitutional: He appears well-developed and well-nourished. No distress.  HENT:  Head: Normocephalic and atraumatic.  Neck: Neck supple.   Pulmonary/Chest: Effort normal.  Musculoskeletal: He exhibits tenderness.  Diffuse tenderness over the lateral malleolus and proximal 4th and 5th metatarsal dorsally. Sensation intact. Cap refill <2 seconds. DP and PT pulses intact. No breaks in skin. No ecchymosis or other skin changes. Calf is soft and nontender.  Neurological: He is alert.  Skin: He is not diaphoretic.  Nursing note and vitals reviewed.   ED Course  Procedures (including critical care time)  DIAGNOSTIC STUDIES: Oxygen Saturation is 98% on room air, normal by my interpretation.    COORDINATION OF CARE: At 2351 Discussed treatment plan with patient. Patient agrees.   Labs Review Labs Reviewed - No data to display  Imaging Review Dg Ankle Complete Left  06/20/2014   CLINICAL DATA:  42 year old male with history of trauma after falling in the midline today complaining of pain in the lateral aspect of the left foot.  EXAM: LEFT ANKLE COMPLETE - 3+ VIEW  COMPARISON:  10/03/2013.  FINDINGS: No acute displaced fracture, subluxation or dislocation. Two fixation screws are noted in the medial malleolus, and the previously noted medial malleolar fracture is completely healed. 2 additional metallic densities are present overlying the medial and lateral malleolus, presumably soft tissue anchors. Ankle mortise is intact.  IMPRESSION: 1. No evidence of acute traumatic injury to the left ankle. 2. Postoperative changes, as above.   Electronically Signed   By: Vinnie Langton M.D.   On: 06/20/2014 23:43   Dg Foot Complete Left  06/20/2014   CLINICAL DATA:  43 year old male with history of trauma after falling in the knee but today, complaining of left lateral foot pain.  EXAM: LEFT FOOT - COMPLETE 3+ VIEW  COMPARISON:  10/03/2013.  FINDINGS: Multiple views of the left foot demonstrate no acute displaced fracture, subluxation, dislocation, or soft tissue abnormality. Orthopedic fixation hardware in the medial and lateral malleoli is  noted.  IMPRESSION: No acute radiographic abnormality of the left foot.   Electronically Signed   By: Vinnie Langton M.D.   On: 06/20/2014 23:40     EKG Interpretation None      MDM   Final diagnoses:  Left ankle pain    Afebrile, nontoxic patient with left ankle pain after mechanical slip and fall.  Xray negative.  Exam unremarkable.   D/C home with aso, crutches, ibuprofen, PCP follow up.  Discussed result, findings, treatment, and follow up  with patient.  Pt given return precautions.  Pt verbalizes understanding and agrees with plan.       I personally performed the services described in this documentation, which was scribed in my presence. The recorded information has been reviewed and is accurate.    Clayton Bibles, PA-C 06/21/14 [place], MD 06/21/14 [phone]

## 2014-06-20 NOTE — ED Notes (Signed)
Patient here with complaint of left lower leg pain secondary to twisting ankle. Previous history of surgery on the same leg for fracture with hardware placement.

## 2014-12-20 ENCOUNTER — Emergency Department (HOSPITAL_COMMUNITY)
Admission: EM | Admit: 2014-12-20 | Discharge: 2014-12-20 | Disposition: A | Discharge status: Home / Self Care | Payer: Self-pay | Attending: Emergency Medicine | Admitting: Emergency Medicine

## 2014-12-20 ENCOUNTER — Encounter (HOSPITAL_COMMUNITY): Payer: Self-pay | Admitting: Nurse Practitioner

## 2014-12-20 ENCOUNTER — Emergency Department (HOSPITAL_COMMUNITY): Payer: Self-pay

## 2014-12-20 DIAGNOSIS — Y9389 Activity, other specified: Secondary | ICD-10-CM | POA: Insufficient documentation

## 2014-12-20 DIAGNOSIS — Y9289 Other specified places as the place of occurrence of the external cause: Secondary | ICD-10-CM | POA: Insufficient documentation

## 2014-12-20 DIAGNOSIS — Y998 Other external cause status: Secondary | ICD-10-CM | POA: Insufficient documentation

## 2014-12-20 DIAGNOSIS — S92515A Nondisplaced fracture of proximal phalanx of left lesser toe(s), initial encounter for closed fracture: Secondary | ICD-10-CM | POA: Insufficient documentation

## 2014-12-20 DIAGNOSIS — G894 Chronic pain syndrome: Secondary | ICD-10-CM | POA: Insufficient documentation

## 2014-12-20 DIAGNOSIS — Z8619 Personal history of other infectious and parasitic diseases: Secondary | ICD-10-CM | POA: Insufficient documentation

## 2014-12-20 DIAGNOSIS — Z79899 Other long term (current) drug therapy: Secondary | ICD-10-CM | POA: Insufficient documentation

## 2014-12-20 DIAGNOSIS — Z8659 Personal history of other mental and behavioral disorders: Secondary | ICD-10-CM | POA: Insufficient documentation

## 2014-12-20 DIAGNOSIS — W2209XA Striking against other stationary object, initial encounter: Secondary | ICD-10-CM | POA: Insufficient documentation

## 2014-12-20 DIAGNOSIS — Z7982 Long term (current) use of aspirin: Secondary | ICD-10-CM | POA: Insufficient documentation

## 2014-12-20 DIAGNOSIS — S92502A Displaced unspecified fracture of left lesser toe(s), initial encounter for closed fracture: Secondary | ICD-10-CM

## 2014-12-20 DIAGNOSIS — Z720 Tobacco use: Secondary | ICD-10-CM | POA: Insufficient documentation

## 2014-12-20 MED ORDER — HYDROCODONE-ACETAMINOPHEN 5-325 MG PO TABS
2.0000 | ORAL_TABLET | Freq: Once | ORAL | Status: AC
Start: 2014-12-20 — End: 2014-12-20
  Administered 2014-12-20: 2 via ORAL
  Filled 2014-12-20: qty 2

## 2014-12-20 MED ORDER — TRAMADOL HCL 50 MG PO TABS: 50.0000 mg | ORAL_TABLET | Freq: Four times a day (QID) | ORAL | Status: DC | PRN

## 2014-12-20 NOTE — ED Notes (Signed)
Pt woke up a 4 am to get a snack and left foot second digit hit edge of table. Pt endorses pain in toe and very tender to palpation. No bleeding, mild swelling noted.

## 2014-12-20 NOTE — ED Provider Notes (Signed)
CSN: [id number]     Arrival date & time 12/20/14  1055 History  This chart was scribed for Lucien Mons, PA-C, working with Lajean Saver, MD by Starleen Arms, ED Scribe. This patient was seen in room TR08C/TR08C and the patient's care was started at 11:10 AM.   Chief Complaint  Patient presents with  . Toe Pain   The history is provided by the patient. No language interpreter was used.   HPI Comments: Antonio [name] is a 44 y.o. male who presents to the Emergency Department complaining of constant, 10/10 left second digit toe pain onset 4:00 am this morning after he stubbed his toe.  The pain is aggravated by waling.  He has not taken any medications for this complaint.   Patient has a history of left ankle surgery 08/4194 without complications or change since onset of today's complaint.  He denies radiation, numbness, tingling, other toe involvement.   Past Medical History  Diagnosis Date  . Hepatitis     hep b and c  . Ruptured spleen   . Pain in thoracic spine   . Chronic pain syndrome   . Depression   . Alcoholic   . Complication of anesthesia     hard to wake up   Past Surgical History  Procedure Laterality Date  . No past surgeries      spleen removed 5 yrs ago  . Spllenectomy    . Orif ankle fracture Left 10/06/2013    Procedure: LEFT ANKLE FRACTURE OPEN TREATMENT BILMALLEOLAR ANKLE INCLUDES INTERNAL FIXATION, LEFT ANKLE FRACTURE OPEN TREATMENT DISTAL TIBIOFIBULAR INCLUDES INTERNAL FIXATION ;  Surgeon: Renette Butters, MD;  Location: [facility];  Service: Orthopedics;  Laterality: Left;   No family history on file. History  Substance Use Topics  . Smoking status: Current Every Day Smoker -- 1.00 packs/day for 15 years    Types: Cigarettes  . Smokeless tobacco: Not on file  . Alcohol Use: 50.4 oz/week    84 Cans of beer per week    Review of Systems  Musculoskeletal: Positive for joint swelling and arthralgias.  Neurological: Negative for numbness.       Allergies  Fish allergy; Sulfa antibiotics; and Bee venom  Home Medications   Prior to Admission medications   Medication Sig Start Date End Date Taking? Authorizing Provider  aspirin 81 MG tablet Take 1 tablet (81 mg total) by mouth daily. 10/06/13   Renette Butters, MD  docusate sodium (COLACE) 100 MG capsule Take 1 capsule (100 mg total) by mouth 2 (two) times daily. Continue this while taking narcotics to help with bowel movements 10/06/13   Renette Butters, MD  gabapentin (NEURONTIN) 300 MG capsule Take 1 capsule (300 mg total) by mouth 3 (three) times daily. For anxiety/pain control 10/11/12   Encarnacion Slates, NP  HYDROmorphone (DILAUDID) 2 MG tablet Take 1 tablet (2 mg total) by mouth every 4 (four) hours as needed for severe pain. 10/06/13   Renette Butters, MD  ibuprofen (ADVIL,MOTRIN) 800 MG tablet Take 1 tablet (800 mg total) by mouth every 8 (eight) hours as needed for mild pain or moderate pain. 06/20/14   Clayton Bibles, PA-C  methadone (DOLOPHINE) 10 MG tablet Take 10 mg by mouth daily.    Historical Provider, MD  ondansetron (ZOFRAN) 4 MG tablet Take 1 tablet (4 mg total) by mouth every 8 (eight) hours as needed for nausea. 10/06/13   Renette Butters, MD  oxyCODONE (ROXICODONE) 5 MG  immediate release tablet Take 2 tablets (10 mg total) by mouth every 4 (four) hours as needed for severe pain. 10/06/13   Renette Butters, MD  ramipril (ALTACE) 2.5 MG capsule Take 2.5 mg by mouth daily.    Historical Provider, MD  traMADol (ULTRAM) 50 MG tablet Take 1 tablet (50 mg total) by mouth every 6 (six) hours as needed. 12/20/14   Krysia Zahradnik M Yordy Matton, PA-C   BP 143/94 mmHg  Pulse 66  Temp(Src) 97.7 F (36.5 C) (Oral)  Resp 16  SpO2 100% Physical Exam  Constitutional: He is oriented to person, place, and time. He appears well-developed and well-nourished. No distress.  HENT:  Head: Normocephalic and atraumatic.  Eyes: Conjunctivae and EOM are normal.  Neck: Normal range of motion. Neck  supple.  Cardiovascular: Normal rate, regular rhythm and normal heart sounds.   Pulmonary/Chest: Effort normal and breath sounds normal.  Musculoskeletal:  L second toe- Swelling at PIP with tenderness over PIP and MCP. No deformity. Cap refill < 2 seconds.  Neurological: He is alert and oriented to person, place, and time.  Skin: Skin is warm and dry.  Psychiatric: He has a normal mood and affect. His behavior is normal.  Nursing note and vitals reviewed.   ED Course  Procedures (including critical care time)  DIAGNOSTIC STUDIES: Oxygen Saturation is 100% on RA, normal by my interpretation.    COORDINATION OF CARE:  11:15 AM Discussed treatment plan with patient at bedside.  Patient acknowledges and agrees with plan.    Labs Review Labs Reviewed - No data to display  Imaging Review Dg Toe 2nd Left  12/20/2014   CLINICAL DATA:  Stubbed left second digit on the table last night  EXAM: LEFT SECOND TOE  COMPARISON:  None.  FINDINGS: Oblique nondisplaced fracture of the shaft of the left second proximal phalanx. No other fracture or dislocation. No focal soft tissue abnormality.  IMPRESSION: Oblique, nondisplaced fracture of the shaft of the left second proximal phalanx.   Electronically Signed   By: Kathreen Devoid   On: 12/20/2014 11:51     EKG Interpretation None      MDM   Final diagnoses:  Fracture of second toe, left, closed, initial encounter   Neurovascularly intact. X-ray confirming oblique, nondisplaced fracture of the shaft of the left second proximal phalanx. Toe is buddy taped and postop shoe given along with crutches. Advised rice, NSAIDs. Tramadol for pain. Pt interrupting xray results saying "imma interrupt you and tell you to give me some shit for pain now". Follow-up with orthopedics. Stable for d/c. Return precautions given. Patient states understanding of treatment care plan and is agreeable.  Addendum: At discharge, family member states the patient is allergic  to tramadol as it makes him nauseous. States he normally takes Norco 5. I advised him to follow-up with the physician who prescribes this. I personally performed the services described in this documentation, which was scribed in my presence. The recorded information has been reviewed and is accurate.  Carman Ching, PA-C 12/20/14 [place], PA-C 12/20/14 [place], MD 12/20/14 [phone]

## 2014-12-20 NOTE — ED Notes (Signed)
The patient is in x-ray.

## 2014-12-20 NOTE — ED Notes (Signed)
Robin, PA in to speak with patient.

## 2014-12-20 NOTE — Discharge Instructions (Signed)
Take tramadol as directed for pain.  Toe Fracture Your caregiver has diagnosed you as having a fractured toe. A toe fracture is a break in the bone of a toe. "Buddy taping" is a way of splinting your broken toe, by taping the broken toe to the toe next to it. This "buddy taping" will keep the injured toe from moving beyond normal range of motion. Buddy taping also helps the toe heal in a more normal alignment. It may take 6 to 8 weeks for the toe injury to heal. [place] your toes taped together for as long as directed by your caregiver or until you see a doctor for a follow-up examination. You can change the tape after bathing. Always use a small piece of gauze or cotton between the toes when taping them together. This will help the skin stay dry and prevent infection.  Apply ice to the injury for 15-20 minutes each hour while awake for the first 2 days. Put the ice in a plastic bag and place a towel between the bag of ice and your skin.  After the first 2 days, apply heat to the injured area. Use heat for the next 2 to 3 days. Place a heating pad on the foot or soak the foot in warm water as directed by your caregiver.  Keep your foot elevated as much as possible to lessen swelling.  Wear sturdy, supportive shoes. The shoes should not pinch the toes or fit tightly against the toes.  Your caregiver may prescribe a rigid shoe if your foot is very swollen.  Your may be given crutches if the pain is too great and it hurts too much to walk.  Only take over-the-counter or prescription medicines for pain, discomfort, or fever as directed by your caregiver.  If your caregiver has given you a follow-up appointment, it is very important to keep that appointment. Not keeping the appointment could result in a chronic or permanent injury, pain, and disability. If there is any problem keeping the appointment, you must call back to this facility for assistance. SEEK MEDICAL CARE IF:     You have increased pain or swelling, not relieved with medications.  The pain does not get better after 1 week.  Your injured toe is cold when the others are warm. SEEK IMMEDIATE MEDICAL CARE IF:   The toe becomes cold, numb, or white.  The toe becomes hot (inflamed) and red. Document Released: 06/06/2000 Document Revised: 09/01/2011 Document Reviewed: 01/24/2008 [facility] Patient Information 2015 [place], [place]. This information is not intended to replace advice given to you by your health care provider. Make sure you discuss any questions you have with your health care provider.   RICE: Routine Care for Injuries The routine care of many injuries includes Rest, Ice, Compression, and Elevation (RICE). HOME CARE INSTRUCTIONS  Rest is needed to allow your body to heal. Routine activities can usually be resumed when comfortable. Injured tendons and bones can take up to 6 weeks to heal. Tendons are the cord-like structures that attach muscle to bone.  Ice following an injury helps keep the swelling down and reduces pain.  Put ice in a plastic bag.  Place a towel between your skin and the bag.  Leave the ice on for 15-20 minutes, 3-4 times a day, or as directed by your health care provider. Do this while awake, for the first 24 to 48 hours. After that, continue as directed by your caregiver.  Compression helps  keep swelling down. It also gives support and helps with discomfort. If an elastic bandage has been applied, it should be removed and reapplied every 3 to 4 hours. It should not be applied tightly, but firmly enough to keep swelling down. Watch fingers or toes for swelling, bluish discoloration, coldness, numbness, or excessive pain. If any of these problems occur, remove the bandage and reapply loosely. Contact your caregiver if these problems continue.  Elevation helps reduce swelling and decreases pain. With extremities, such as the arms, hands, legs, and feet, the injured area  should be placed near or above the level of the heart, if possible. SEEK IMMEDIATE MEDICAL CARE IF:  You have persistent pain and swelling.  You develop redness, numbness, or unexpected weakness.  Your symptoms are getting worse rather than improving after several days. These symptoms may indicate that further evaluation or further X-rays are needed. Sometimes, X-rays may not show a small broken bone (fracture) until 1 week or 10 days later. Make a follow-up appointment with your caregiver. Ask when your X-ray results will be ready. Make sure you get your X-ray results. Document Released: 09/21/2000 Document Revised: 06/14/2013 Document Reviewed: 11/08/2010 [facility] Patient Information 2015 [place], [place]. This information is not intended to replace advice given to you by your health care provider. Make sure you discuss any questions you have with your health care provider.

## 2014-12-20 NOTE — Progress Notes (Signed)
Orthopedic Tech Progress Note Patient Details:  Antonio [name] April 18, 1971 [id number]  Ortho Devices Type of Ortho Device: Crutches, Postop shoe/boot Ortho Device/Splint Location: lle Ortho Device/Splint Interventions: Application RN applied buddy tape to toes  [name] 12/20/2014, 12:16 PM

## 2014-12-28 ENCOUNTER — Emergency Department (HOSPITAL_COMMUNITY): Payer: Self-pay

## 2014-12-28 ENCOUNTER — Emergency Department (HOSPITAL_COMMUNITY)
Admission: EM | Admit: 2014-12-28 | Discharge: 2014-12-28 | Disposition: A | Discharge status: Home / Self Care | Payer: Self-pay | Attending: Emergency Medicine | Admitting: Emergency Medicine

## 2014-12-28 ENCOUNTER — Encounter (HOSPITAL_COMMUNITY): Payer: Self-pay | Admitting: Emergency Medicine

## 2014-12-28 DIAGNOSIS — Z8619 Personal history of other infectious and parasitic diseases: Secondary | ICD-10-CM | POA: Insufficient documentation

## 2014-12-28 DIAGNOSIS — Z720 Tobacco use: Secondary | ICD-10-CM | POA: Insufficient documentation

## 2014-12-28 DIAGNOSIS — Y929 Unspecified place or not applicable: Secondary | ICD-10-CM | POA: Insufficient documentation

## 2014-12-28 DIAGNOSIS — Y998 Other external cause status: Secondary | ICD-10-CM | POA: Insufficient documentation

## 2014-12-28 DIAGNOSIS — S90122A Contusion of left lesser toe(s) without damage to nail, initial encounter: Secondary | ICD-10-CM | POA: Insufficient documentation

## 2014-12-28 DIAGNOSIS — Z79899 Other long term (current) drug therapy: Secondary | ICD-10-CM | POA: Insufficient documentation

## 2014-12-28 DIAGNOSIS — F329 Major depressive disorder, single episode, unspecified: Secondary | ICD-10-CM | POA: Insufficient documentation

## 2014-12-28 DIAGNOSIS — Y939 Activity, unspecified: Secondary | ICD-10-CM | POA: Insufficient documentation

## 2014-12-28 DIAGNOSIS — Z7982 Long term (current) use of aspirin: Secondary | ICD-10-CM | POA: Insufficient documentation

## 2014-12-28 DIAGNOSIS — X58XXXA Exposure to other specified factors, initial encounter: Secondary | ICD-10-CM | POA: Insufficient documentation

## 2014-12-28 DIAGNOSIS — G894 Chronic pain syndrome: Secondary | ICD-10-CM | POA: Insufficient documentation

## 2014-12-28 MED ORDER — DICLOFENAC SODIUM 50 MG PO TBEC: 50.0000 mg | DELAYED_RELEASE_TABLET | Freq: Two times a day (BID) | ORAL | Status: DC

## 2014-12-28 MED ORDER — HYDROCODONE-ACETAMINOPHEN 5-325 MG PO TABS
1.0000 | ORAL_TABLET | Freq: Once | ORAL | Status: AC
  Administered 2014-12-28: 1 via ORAL
  Filled 2014-12-28: qty 1

## 2014-12-28 NOTE — ED Notes (Signed)
Patient in radiology for xray.

## 2014-12-28 NOTE — ED Provider Notes (Signed)
CSN: [id number]     Arrival date & time 12/28/14  1237 History  This chart was scribed for [facility], NP, working with Dorie Rank, MD by Julien Nordmann, ED Scribe. This patient was seen in room TR02C/TR02C and the patient's care was started at 1:26 PM.    Chief Complaint  Patient presents with  . Toe Pain      Patient is a 44 y.o. male presenting with toe pain. The history is provided by the patient. No language interpreter was used.  Toe Pain This is a new problem. The current episode started yesterday. The problem occurs constantly. The problem has been gradually worsening. The symptoms are aggravated by walking. Nothing relieves the symptoms. He has tried nothing for the symptoms.   HPI Comments: Kayla Weekes is a 44 y.o. male who has a hx of depression presents to the Emergency Department complaining of constant, gradual worsening left 2nd toe pain onset yesterday. Pt notes he broke his toe a week ago and plans to follow up with Dr. Percell Miller. He has a post op shoe to walk with but is not wearing the shoe. He reports hitting his toe again last night. He reports not taking any medication to alleviate the pain. He was given Tramadol Rx on his last visit but did not get it filled stating that it would not help. He does not take any medication for his depression and has not seen a psychiatrist lately. He has been a previous pt at [facility]. Pt denies suicidal and homicidal ideations.   Past Medical History  Diagnosis Date  . Hepatitis     hep b and c  . Ruptured spleen   . Pain in thoracic spine   . Chronic pain syndrome   . Depression   . Alcoholic   . Complication of anesthesia     hard to wake up   Past Surgical History  Procedure Laterality Date  . No past surgeries      spleen removed 5 yrs ago  . Spllenectomy    . Orif ankle fracture Left 10/06/2013    Procedure: LEFT ANKLE FRACTURE OPEN TREATMENT BILMALLEOLAR ANKLE INCLUDES INTERNAL FIXATION, LEFT ANKLE FRACTURE OPEN TREATMENT  DISTAL TIBIOFIBULAR INCLUDES INTERNAL FIXATION ;  Surgeon: Renette Butters, MD;  Location: [facility];  Service: Orthopedics;  Laterality: Left;   No family history on file. History  Substance Use Topics  . Smoking status: Current Every Day Smoker -- 1.00 packs/day for 15 years    Types: Cigarettes  . Smokeless tobacco: Not on file  . Alcohol Use: 50.4 oz/week    84 Cans of beer per week    Review of Systems  Musculoskeletal: Positive for arthralgias.  Psychiatric/Behavioral: Negative for suicidal ideas.  All other systems reviewed and are negative.     Allergies  Fish allergy; Sulfa antibiotics; and Bee venom  Home Medications   Prior to Admission medications   Medication Sig Start Date End Date Taking? Authorizing Provider  aspirin 81 MG tablet Take 1 tablet (81 mg total) by mouth daily. 10/06/13   Renette Butters, MD  diclofenac (VOLTAREN) 50 MG EC tablet Take 1 tablet (50 mg total) by mouth 2 (two) times daily. 12/28/14   Haylei Cobin Bunnie Pion, NP  docusate sodium (COLACE) 100 MG capsule Take 1 capsule (100 mg total) by mouth 2 (two) times daily. Continue this while taking narcotics to help with bowel movements 10/06/13   Renette Butters, MD  gabapentin (NEURONTIN) 300 MG  capsule Take 1 capsule (300 mg total) by mouth 3 (three) times daily. For anxiety/pain control 10/11/12   Encarnacion Slates, NP  HYDROmorphone (DILAUDID) 2 MG tablet Take 1 tablet (2 mg total) by mouth every 4 (four) hours as needed for severe pain. 10/06/13   Renette Butters, MD  ibuprofen (ADVIL,MOTRIN) 800 MG tablet Take 1 tablet (800 mg total) by mouth every 8 (eight) hours as needed for mild pain or moderate pain. 06/20/14   Clayton Bibles, PA-C  methadone (DOLOPHINE) 10 MG tablet Take 10 mg by mouth daily.    Historical Provider, MD  ondansetron (ZOFRAN) 4 MG tablet Take 1 tablet (4 mg total) by mouth every 8 (eight) hours as needed for nausea. 10/06/13   Renette Butters, MD  oxyCODONE (ROXICODONE) 5 MG  immediate release tablet Take 2 tablets (10 mg total) by mouth every 4 (four) hours as needed for severe pain. 10/06/13   Renette Butters, MD  ramipril (ALTACE) 2.5 MG capsule Take 2.5 mg by mouth daily.    Historical Provider, MD  traMADol (ULTRAM) 50 MG tablet Take 1 tablet (50 mg total) by mouth every 6 (six) hours as needed. 12/20/14   Carman Ching, PA-C   Triage vitals: BP 138/89 mmHg  Pulse 61  Temp(Src) 97.8 F (36.6 C) (Oral)  Resp 16  Ht 5\' 3"  (1.6 m)  Wt 132 lb (59.875 kg)  BMI 23.39 kg/m2  SpO2 100% Physical Exam  Constitutional: He is oriented to person, place, and time. He appears well-developed and well-nourished.  HENT:  Head: Normocephalic and atraumatic.  Eyes: EOM are normal.  Neck: Neck supple.  Cardiovascular: Normal rate.   Pulmonary/Chest: Effort normal.  Musculoskeletal: Normal range of motion. He exhibits no edema.       Left foot: There is tenderness and swelling. There is normal range of motion, normal capillary refill, no deformity and no laceration.       Feet:  Left second toe tender on exam.  Neurological: He is alert and oriented to person, place, and time.  Pedal pulses 2+, adequate circulation  Skin: Skin is warm and dry.  Psychiatric: He has a normal mood and affect.  Nursing note and vitals reviewed.   ED Course  Procedures  DIAGNOSTIC STUDIES: Oxygen Saturation is 100% on RA, normal by my interpretation.  COORDINATION OF CARE:  1:27 PM Discussed treatment plan which includes x-ray of left second toe with pt at bedside and pt agreed to plan.  Labs Review Labs Reviewed - No data to display  Imaging Review Dg Foot Complete Left  12/28/2014   CLINICAL DATA:  Fracture follow-up.  Injury last night  EXAM: LEFT FOOT - COMPLETE 3+ VIEW  COMPARISON:  12/20/2014  FINDINGS: Oblique fracture of the proximal and second phalanx unchanged in position and alignment from the prior study.  No new fracture.  Internal fixation of ankle fracture noted.   IMPRESSION: Fracture of the second proximal phalanx unchanged from the prior study without significant bony healing. No new fracture.   Electronically Signed   By: Franchot Gallo M.D.   On: 12/28/2014 14:00    MDM  44 y.o. male with left second toe pain after he re injured it last night. Stable for d/c without neurovascular compromise. He will follow up with Dr. Percell Miller as planned. Encouraged to fill Tramadol Rx.  Final diagnoses:  Contusion, toe, left, initial encounter   I personally performed the services described in this documentation, which was scribed in  my presence. The recorded information has been reviewed and is accurate.   [place], NP 12/28/14 [place], MD 12/29/14 [phone]

## 2014-12-28 NOTE — Discharge Instructions (Signed)
Your x-ray today shows no new fracture or injury to the toe. The fracture from the previous visit has not healed yet. Keep your follow up with Dr. Percell Miller. Get your prescription for your tramadol filled.

## 2014-12-28 NOTE — ED Notes (Signed)
Patient states he had broken toe previously, but now is having increased pain to L 2nd toe.   Patient has been using a single crutch to help walk.

## 2015-02-12 ENCOUNTER — Emergency Department (HOSPITAL_COMMUNITY)
Admission: EM | Admit: 2015-02-12 | Discharge: 2015-02-12 | Discharge status: Against Medical Advice | Payer: Self-pay | Attending: Emergency Medicine | Admitting: Emergency Medicine

## 2015-02-12 ENCOUNTER — Encounter (HOSPITAL_COMMUNITY): Payer: Self-pay | Admitting: *Deleted

## 2015-02-12 DIAGNOSIS — Y998 Other external cause status: Secondary | ICD-10-CM | POA: Insufficient documentation

## 2015-02-12 DIAGNOSIS — W5911XA Bitten by nonvenomous snake, initial encounter: Secondary | ICD-10-CM | POA: Insufficient documentation

## 2015-02-12 DIAGNOSIS — S81831A Puncture wound without foreign body, right lower leg, initial encounter: Secondary | ICD-10-CM | POA: Insufficient documentation

## 2015-02-12 DIAGNOSIS — Y9289 Other specified places as the place of occurrence of the external cause: Secondary | ICD-10-CM | POA: Insufficient documentation

## 2015-02-12 DIAGNOSIS — G894 Chronic pain syndrome: Secondary | ICD-10-CM | POA: Insufficient documentation

## 2015-02-12 DIAGNOSIS — Y9389 Activity, other specified: Secondary | ICD-10-CM | POA: Insufficient documentation

## 2015-02-12 DIAGNOSIS — Z720 Tobacco use: Secondary | ICD-10-CM | POA: Insufficient documentation

## 2015-02-12 NOTE — ED Notes (Signed)
Pt reports being bit by a "black snake with white around the eyes" today to rt leg. No visible marks in triage. NAD noted

## 2015-02-12 NOTE — ED Notes (Signed)
Pt ambulated out of ER without any distress.

## 2016-01-29 ENCOUNTER — Emergency Department (HOSPITAL_COMMUNITY)
Admission: EM | Admit: 2016-01-29 | Discharge: 2016-01-30 | Disposition: A | Discharge status: Home / Self Care | Payer: Self-pay | Attending: Emergency Medicine | Admitting: Emergency Medicine

## 2016-01-29 ENCOUNTER — Emergency Department (HOSPITAL_COMMUNITY): Payer: Self-pay

## 2016-01-29 ENCOUNTER — Encounter (HOSPITAL_COMMUNITY): Payer: Self-pay

## 2016-01-29 DIAGNOSIS — W1830XA Fall on same level, unspecified, initial encounter: Secondary | ICD-10-CM | POA: Insufficient documentation

## 2016-01-29 DIAGNOSIS — Z7982 Long term (current) use of aspirin: Secondary | ICD-10-CM | POA: Insufficient documentation

## 2016-01-29 DIAGNOSIS — Y9301 Activity, walking, marching and hiking: Secondary | ICD-10-CM | POA: Insufficient documentation

## 2016-01-29 DIAGNOSIS — S52521A Torus fracture of lower end of right radius, initial encounter for closed fracture: Secondary | ICD-10-CM | POA: Insufficient documentation

## 2016-01-29 DIAGNOSIS — Z79899 Other long term (current) drug therapy: Secondary | ICD-10-CM | POA: Insufficient documentation

## 2016-01-29 DIAGNOSIS — S5290XA Unspecified fracture of unspecified forearm, initial encounter for closed fracture: Secondary | ICD-10-CM

## 2016-01-29 DIAGNOSIS — F17210 Nicotine dependence, cigarettes, uncomplicated: Secondary | ICD-10-CM | POA: Insufficient documentation

## 2016-01-29 DIAGNOSIS — Y999 Unspecified external cause status: Secondary | ICD-10-CM | POA: Insufficient documentation

## 2016-01-29 DIAGNOSIS — Y929 Unspecified place or not applicable: Secondary | ICD-10-CM | POA: Insufficient documentation

## 2016-01-29 DIAGNOSIS — Z23 Encounter for immunization: Secondary | ICD-10-CM | POA: Insufficient documentation

## 2016-01-29 MED ORDER — NAPROXEN 500 MG PO TABS: 500.0000 mg | ORAL_TABLET | Freq: Two times a day (BID) | ORAL | 0 refills | Status: DC

## 2016-01-29 MED ORDER — IBUPROFEN 400 MG PO TABS
600.0000 mg | ORAL_TABLET | Freq: Once | ORAL | Status: AC
  Administered 2016-01-29: 600 mg via ORAL
  Filled 2016-01-29: qty 1

## 2016-01-29 MED ORDER — TRAMADOL HCL 50 MG PO TABS: 50.0000 mg | ORAL_TABLET | Freq: Four times a day (QID) | ORAL | 0 refills | Status: DC | PRN

## 2016-01-29 MED ORDER — OXYCODONE-ACETAMINOPHEN 5-325 MG PO TABS
1.0000 | ORAL_TABLET | Freq: Once | ORAL | Status: AC
  Administered 2016-01-29: 1 via ORAL
  Filled 2016-01-29: qty 1

## 2016-01-29 MED ORDER — TETANUS-DIPHTH-ACELL PERTUSSIS 5-2.5-18.5 LF-MCG/0.5 IM SUSP
0.5000 mL | Freq: Once | INTRAMUSCULAR | Status: AC
  Administered 2016-01-29: 0.5 mL via INTRAMUSCULAR
  Filled 2016-01-29: qty 0.5

## 2016-01-29 NOTE — ED Triage Notes (Signed)
Onset 2 hours PTA pt was walking backwards and fell, landed on right hand.  Pain to hand and wrist.  No flexion of right hand, can extend, not able to make full fist.  Numbness noted.

## 2016-01-29 NOTE — ED Notes (Signed)
Ortho is at the bedside.  Paged ortho tech

## 2016-01-29 NOTE — ED Provider Notes (Signed)
[facility] DEPT Provider Note   CSN: [id number] Arrival date & time: 01/29/16  1858  First Provider Contact:  None    By signing my name below, I, Higinio Plan, attest that this documentation has been prepared under the direction and in the presence of non-physician practitioner, Debroah Baller, NP. Electronically Signed: Higinio Plan, Scribe. 01/29/2016. 8:30 PM.  History   Chief Complaint Chief Complaint  Patient presents with  . Wrist Injury   The history is provided by the patient. No language interpreter was used.   HPI Comments: Jessiel Langlais is a 45 y.o. male with PMHx of chronic pain syndrome and alcoholism, who presents to the Emergency Department complaining of gradually worsening, right wrist pain s/p a fall that occurred 2 hours PTA. Pt reports he was standing on a porch with no railings when he turned around and fell; he states he used his right hand to catch himself. He notes associated swelling to his right wrist. He denies hitting his head or loss of consciousness. Pt states his last tetanus immunization was given in [place].   Past Medical History:  Diagnosis Date  . Alcoholic ([place])   . Chronic pain syndrome   . Complication of anesthesia    hard to wake up  . Depression   . Hepatitis    hep b and c  . Pain in thoracic spine   . Ruptured spleen    Patient Active Problem List   Diagnosis Date Noted  . Opioid dependence ([place]) 10/06/2012  . Unspecified episodic mood disorder 10/06/2012  . Alcohol dependence ([place]) 10/06/2012  . Polysubstance abuse 02/24/2012  . Chronic back pain 02/24/2012  . Withdrawal from opioids ([place]) 02/24/2012  . Alcohol withdrawal ([place]) 02/24/2012  . 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 Thrombocytopenia ([place]) 02/24/2012  . Neutropenia- low relative neutrophil count 02/24/2012  . Dehydration with hypernatremia 02/24/2012  . Elevated CPK 02/24/2012  . Metabolic encephalopathy 2/2 alcohol and opiods/dehydration 02/24/2012  .  Depression 02/24/2012  . Hepatitis B 02/24/2012  . Hepatitis C 02/24/2012  . Alcohol abuse, daily use 06/02/2011   Past Surgical History:  Procedure Laterality Date  . NO PAST SURGERIES     spleen removed 5 yrs ago  . ORIF ANKLE FRACTURE Left 10/06/2013   Procedure: LEFT ANKLE FRACTURE OPEN TREATMENT BILMALLEOLAR ANKLE INCLUDES INTERNAL FIXATION, LEFT ANKLE FRACTURE OPEN TREATMENT DISTAL TIBIOFIBULAR INCLUDES INTERNAL FIXATION ;  Surgeon: Renette Butters, MD;  Location: [facility];  Service: Orthopedics;  Laterality: Left;  . spllenectomy      Home Medications    Prior to Admission medications   Medication Sig Start Date End Date Taking? Authorizing Provider  aspirin 81 MG tablet Take 1 tablet (81 mg total) by mouth daily. 10/06/13   Renette Butters, MD  docusate sodium (COLACE) 100 MG capsule Take 1 capsule (100 mg total) by mouth 2 (two) times daily. Continue this while taking narcotics to help with bowel movements 10/06/13   Renette Butters, MD  gabapentin (NEURONTIN) 300 MG capsule Take 1 capsule (300 mg total) by mouth 3 (three) times daily. For anxiety/pain control 10/11/12   Encarnacion Slates, NP  HYDROmorphone (DILAUDID) 2 MG tablet Take 1 tablet (2 mg total) by mouth every 4 (four) hours as needed for severe pain. 10/06/13   Renette Butters, MD  methadone (DOLOPHINE) 10 MG tablet Take 10 mg by mouth daily.    Historical Provider, MD  naproxen (NAPROSYN) 500 MG tablet Take 1 tablet (500  mg total) by mouth 2 (two) times daily. 01/29/16   Itali Mckendry Bunnie Pion, NP  ondansetron (ZOFRAN) 4 MG tablet Take 1 tablet (4 mg total) by mouth every 8 (eight) hours as needed for nausea. 10/06/13   Renette Butters, MD  oxyCODONE (ROXICODONE) 5 MG immediate release tablet Take 2 tablets (10 mg total) by mouth every 4 (four) hours as needed for severe pain. 10/06/13   Renette Butters, MD  ramipril (ALTACE) 2.5 MG capsule Take 2.5 mg by mouth daily.    Historical Provider, MD  traMADol (ULTRAM) 50  MG tablet Take 1 tablet (50 mg total) by mouth every 6 (six) hours as needed. 01/29/16   Landrey Mahurin Bunnie Pion, NP   Family History No family history on file.  Social History Social History  Substance Use Topics  . Smoking status: Current Every Day Smoker    Packs/day: 1.00    Years: 15.00    Types: Cigarettes  . Smokeless tobacco: Never Used  . Alcohol use 50.4 oz/week    84 Cans of beer per week   Allergies   Fish allergy; Sulfa antibiotics; and Bee venom  Review of Systems Review of Systems  Musculoskeletal: Positive for arthralgias (left wrist) and joint swelling.  Neurological: Negative for syncope.   Physical Exam Updated Vital Signs BP 120/95 (BP Location: Right Arm)   Pulse 75   Temp 98.2 F (36.8 C) (Oral)   Resp 20   Ht 5\' 6"  (1.676 m)   Wt 146 lb 5 oz (66.4 kg)   SpO2 97%   BMI 23.62 kg/m   Physical Exam  Constitutional: He is oriented to person, place, and time. He appears well-developed and well-nourished. No distress.  Pt tearful and appears to be in moderate pain  HENT:  Head: Normocephalic and atraumatic.  Eyes: EOM are normal. Right eye exhibits no discharge. Left eye exhibits no discharge. No scleral icterus.  Neck: Normal range of motion.  Cardiovascular: Normal rate.   Pulmonary/Chest: Effort normal. No stridor.  Musculoskeletal:       Right wrist: He exhibits tenderness, bony tenderness and swelling. He exhibits no deformity. Decreased range of motion: due to pain. Lacerations: abrasion.  Radial pulses 2+ Limited ROM due to pain Swelling noted to dorsum of right wrist, pain with passive range of motion.    Neurological: He is alert and oriented to person, place, and time. Coordination normal.  Skin: Skin is warm and dry. Capillary refill takes less than 2 seconds.  Psychiatric: He has a normal mood and affect. His behavior is normal.  Nursing note and vitals reviewed.  ED Treatments / Results  Labs (all labs ordered are listed, but only abnormal  results are displayed) Labs Reviewed - No data to display Radiology Dg Wrist Complete Right  Result Date: 01/29/2016 CLINICAL DATA:  Pain after trauma EXAM: RIGHT WRIST - COMPLETE 3+ VIEW COMPARISON:  None. FINDINGS: There is a nondisplaced fracture through the distal radius extending into the radiocarpal joint. IMPRESSION: Nondisplaced fracture through the distal radius. The fracture extends into the radiocarpal joint. Electronically Signed   By: Dorise Bullion III M.D   On: 01/29/2016 20:42   Dg Hand Complete Right  Result Date: 01/29/2016 CLINICAL DATA:  Pain after fall EXAM: RIGHT HAND - COMPLETE 3+ VIEW COMPARISON:  None. FINDINGS: There is no evidence of fracture or dislocation. There is no evidence of arthropathy or other focal bone abnormality. Soft tissues are unremarkable. IMPRESSION: Negative. Electronically Signed   By: Shanon Brow  Jimmye Norman III M.D   On: 01/29/2016 20:40   Procedures Procedures  DIAGNOSTIC STUDIES:  Oxygen Saturation is 97% on RA, normal by my interpretation.    COORDINATION OF CARE: Consult with Dr. Amedeo Plenty and he will see the patient in the ED.   8:28 PM Discussed treatment plan with pt at bedside and pt agreed to plan.  Medications Ordered in ED Medications  oxyCODONE-acetaminophen (PERCOCET/ROXICET) 5-325 MG per tablet 1 tablet (1 tablet Oral Given 01/29/16 2050)  ibuprofen (ADVIL,MOTRIN) tablet 600 mg (600 mg Oral Given 01/29/16 2050)  Tdap (BOOSTRIX) injection 0.5 mL (0.5 mLs Intramuscular Given 01/29/16 2246)   Initial Impression / Assessment and Plan / ED Course  I have reviewed the triage vital signs and the nursing notes.  Pertinent imaging results that were available during my care of the patient were reviewed by me and considered in my medical decision making (see chart for details).  Clinical Course  Dr. Vanetta Shawl PA here to cast patient and discuss plan of care which includes follow up in the office in 2 weeks.   I personally performed the services  described in this documentation, which was scribed in my presence. The recorded information has been reviewed and is accurate.   Final Clinical Impressions(s) / ED Diagnoses  45 y.o. male with right wrist injury stable for d/c without focal neuro deficits. Will d/c home to f/u in the office with Dr. Amedeo Plenty in 2 weeks. He will return sooner for any problems.  Final diagnoses:  Radial fracture, unspecified laterality, closed, initial encounter    New Prescriptions New Prescriptions   NAPROXEN (NAPROSYN) 500 MG TABLET    Take 1 tablet (500 mg total) by mouth 2 (two) times daily.   TRAMADOL (ULTRAM) 50 MG TABLET    Take 1 tablet (50 mg total) by mouth every 6 (six) hours as needed.     [address] [place], [place] 01/29/16 2315    Julianne Rice, MD 01/31/16 [phone]

## 2016-01-29 NOTE — Progress Notes (Signed)
Orthopedic Tech Progress Note Patient Details:  Antonio [name] Sep 03, 1970 [id number]  Casting Type of Cast: Short arm cast Cast Location: RUE Cast Material: Fiberglass Cast Intervention: Application     [name] 01/29/2016, 11:05 PM

## 2016-01-29 NOTE — Discharge Instructions (Signed)
Elevate the area as often as possible. Apply ice. Follow up with Dr. Amedeo Plenty in 2 weeks. Return sooner for any problems.

## 2016-01-29 NOTE — Progress Notes (Signed)
Orthopedic Tech Progress Note Patient Details:  Antonio [name] February 12, 1971 [id number]  Ortho Devices Type of Ortho Device: Velcro wrist splint Ortho Device/Splint Location: RUE Ortho Device/Splint Interventions: Ordered, Application   [name] 01/29/2016, 11:11 PM

## 2016-01-30 NOTE — H&P (Signed)
NAMEARMAHNI, MUCKELROY NO.:  [id number]  MEDICAL RECORD NO.:  [id number]  LOCATION:  TR05C                        FACILITY:  [facility]  PHYSICIAN:  Satira Anis. Gramig, M.D.DATE OF BIRTH:  1970/12/17  DATE OF ADMISSION:  01/29/2016 DATE OF DISCHARGE:  01/30/2016                             HISTORY & PHYSICAL   I am going to complete his original note from the consult note from January 29, 2016 at 11:11 p.m.  Unfortunately, the web site went down during the middle of the note, and thus it is incomplete, and I need to complete this.  I will complete this beginning with physical examination.  PHYSICAL EXAMINATION:  GENERAL:  The patient is intermittently tearful. The room smells malodorous of alcohol.  He appears intoxicated.  He is pleasant however and conversant. HEENT:  Head is atraumatic, normocephalic.  He has equal bilateral chest expansions present.  Respirations are nonlabored.  Evaluation of the right upper extremity shows that he has mild dorsal edema.  No significant ecchymoses.  No signs of infection.  No signs of cellulitis. NEUROLOGICAL/EXTREMITIES:  Neurovascularly, he is intact.  Digital range of motion is intact.  He is point tender over the distal radius.  [place]na is nontender.  Elbow and shoulder are stable and nontender.  IMAGING STUDIES:  Once again, radiographs are reviewed and show a nondisplaced distal radius fracture present.  ASSESSMENT: 1. Nondisplaced right distal radius fracture. 2. History of polysubstance abuse. 3. History of mood disorder. 4. History of chronic pain syndrome. 5. History of alcoholism. 6. History of anxiety and depression. 7. History of hepatitis B as well as hepatitis C.  PLAN:  We went through all the issues with the patient regards to his upper extremity and treatment options at this juncture, to proceed with conservative measures in the form of casting.  I have discussed with him short-arm cast.  We have applied this  without difficulties.  I have discussed with him at length keeping this clean and dry and intact and to not remove it nor get it wet.  We discussed with him of diligent elevation, edema control, and finger range of motion.  I have also equipped him with a Futuro wrist brace to be utilized in approximately four weeks.  He will bring this to the office setting with him.  We will need to see him in two weeks with repeat radiographs.  We discussed with him utilizing the hand for minimal weightbearing for activities of daily living only, to try and prevent displacement which would require ultimate surgical intervention.  He states understanding of this.  We went over all issues with him at length.  We will look towards seeing him in two weeks with repeat radiographs in the cast in our office setting.     Avelina Laine, P.A.-C.   ______________________________ Satira Anis. Amedeo Plenty, M.D.    BB/MEDQ  D:  01/30/2016  T:  01/30/2016  Job:  [id number]

## 2016-01-30 NOTE — ED Notes (Signed)
Pt left during epic downtime, paper d/c instructions

## 2016-02-12 ENCOUNTER — Observation Stay (HOSPITAL_COMMUNITY): Payer: Self-pay

## 2016-02-12 ENCOUNTER — Emergency Department (HOSPITAL_COMMUNITY): Payer: Self-pay

## 2016-02-12 ENCOUNTER — Inpatient Hospital Stay (HOSPITAL_COMMUNITY)
Admission: EM | Admit: 2016-02-12 | Discharge: 2016-02-20 | DRG: 391 | Disposition: A | Discharge status: Home / Self Care | Payer: Self-pay | Attending: Oncology | Admitting: Oncology

## 2016-02-12 ENCOUNTER — Encounter (HOSPITAL_COMMUNITY): Payer: Self-pay | Admitting: Emergency Medicine

## 2016-02-12 DIAGNOSIS — R55 Syncope and collapse: Secondary | ICD-10-CM

## 2016-02-12 DIAGNOSIS — B1920 Unspecified viral hepatitis C without hepatic coma: Secondary | ICD-10-CM | POA: Diagnosis present

## 2016-02-12 DIAGNOSIS — F10239 Alcohol dependence with withdrawal, unspecified: Secondary | ICD-10-CM | POA: Diagnosis present

## 2016-02-12 DIAGNOSIS — R7989 Other specified abnormal findings of blood chemistry: Secondary | ICD-10-CM

## 2016-02-12 DIAGNOSIS — Y902 Blood alcohol level of 40-59 mg/100 ml: Secondary | ICD-10-CM

## 2016-02-12 DIAGNOSIS — K2920 Alcoholic gastritis without bleeding: Principal | ICD-10-CM | POA: Diagnosis present

## 2016-02-12 DIAGNOSIS — F1490 Cocaine use, unspecified, uncomplicated: Secondary | ICD-10-CM | POA: Diagnosis present

## 2016-02-12 DIAGNOSIS — F1924 Other psychoactive substance dependence with psychoactive substance-induced mood disorder: Secondary | ICD-10-CM | POA: Diagnosis present

## 2016-02-12 DIAGNOSIS — F10939 Alcohol use, unspecified with withdrawal, unspecified: Secondary | ICD-10-CM | POA: Diagnosis present

## 2016-02-12 DIAGNOSIS — R945 Abnormal results of liver function studies: Secondary | ICD-10-CM | POA: Diagnosis present

## 2016-02-12 DIAGNOSIS — D6959 Other secondary thrombocytopenia: Secondary | ICD-10-CM | POA: Diagnosis present

## 2016-02-12 DIAGNOSIS — R45851 Suicidal ideations: Secondary | ICD-10-CM | POA: Diagnosis present

## 2016-02-12 DIAGNOSIS — Q8901 Asplenia (congenital): Secondary | ICD-10-CM

## 2016-02-12 DIAGNOSIS — K5900 Constipation, unspecified: Secondary | ICD-10-CM | POA: Diagnosis present

## 2016-02-12 DIAGNOSIS — F1410 Cocaine abuse, uncomplicated: Secondary | ICD-10-CM

## 2016-02-12 DIAGNOSIS — K852 Alcohol induced acute pancreatitis without necrosis or infection: Secondary | ICD-10-CM | POA: Diagnosis present

## 2016-02-12 DIAGNOSIS — R079 Chest pain, unspecified: Secondary | ICD-10-CM | POA: Diagnosis present

## 2016-02-12 DIAGNOSIS — F329 Major depressive disorder, single episode, unspecified: Secondary | ICD-10-CM | POA: Diagnosis present

## 2016-02-12 DIAGNOSIS — K709 Alcoholic liver disease, unspecified: Secondary | ICD-10-CM | POA: Diagnosis present

## 2016-02-12 DIAGNOSIS — D696 Thrombocytopenia, unspecified: Secondary | ICD-10-CM | POA: Diagnosis present

## 2016-02-12 DIAGNOSIS — R1032 Left lower quadrant pain: Secondary | ICD-10-CM

## 2016-02-12 DIAGNOSIS — F1020 Alcohol dependence, uncomplicated: Secondary | ICD-10-CM

## 2016-02-12 DIAGNOSIS — K859 Acute pancreatitis without necrosis or infection, unspecified: Secondary | ICD-10-CM

## 2016-02-12 DIAGNOSIS — F17210 Nicotine dependence, cigarettes, uncomplicated: Secondary | ICD-10-CM | POA: Diagnosis present

## 2016-02-12 DIAGNOSIS — Z9081 Acquired absence of spleen: Secondary | ICD-10-CM

## 2016-02-12 DIAGNOSIS — K700 Alcoholic fatty liver: Secondary | ICD-10-CM | POA: Diagnosis present

## 2016-02-12 DIAGNOSIS — R339 Retention of urine, unspecified: Secondary | ICD-10-CM | POA: Diagnosis present

## 2016-02-12 DIAGNOSIS — F1910 Other psychoactive substance abuse, uncomplicated: Secondary | ICD-10-CM | POA: Diagnosis present

## 2016-02-12 LAB — RAPID URINE DRUG SCREEN, HOSP PERFORMED
Amphetamines: NOT DETECTED
BENZODIAZEPINES: NOT DETECTED
Barbiturates: NOT DETECTED
COCAINE: POSITIVE — AB
Opiates: POSITIVE — AB
Tetrahydrocannabinol: NOT DETECTED

## 2016-02-12 LAB — COMPREHENSIVE METABOLIC PANEL
ALBUMIN: 3.3 g/dL — AB (ref 3.5–5.0)
ALK PHOS: 110 U/L (ref 38–126)
ALT: 120 U/L — AB (ref 17–63)
ANION GAP: 12 (ref 5–15)
AST: 326 U/L — AB (ref 15–41)
BUN: 15 mg/dL (ref 6–20)
CALCIUM: 8.6 mg/dL — AB (ref 8.9–10.3)
CO2: 22 mmol/L (ref 22–32)
Chloride: 102 mmol/L (ref 101–111)
Creatinine, Ser: 0.73 mg/dL (ref 0.61–1.24)
GFR calc Af Amer: >60 mL/min (ref >60)
GFR calc non Af Amer: >60 mL/min (ref >60)
GLUCOSE: 90 mg/dL (ref 65–99)
Potassium: 3.9 mmol/L (ref 3.5–5.1)
SODIUM: 136 mmol/L (ref 135–145)
Total Bilirubin: 3.1 mg/dL — ABNORMAL HIGH (ref 0.3–1.2)
Total Protein: 8.2 g/dL — ABNORMAL HIGH (ref 6.5–8.1)

## 2016-02-12 LAB — URINALYSIS, ROUTINE W REFLEX MICROSCOPIC
Glucose, UA: NEGATIVE mg/dL
HGB URINE DIPSTICK: NEGATIVE
Ketones, ur: 40 mg/dL — AB
Nitrite: NEGATIVE
Protein, ur: NEGATIVE mg/dL
SPECIFIC GRAVITY, URINE: 1.023 (ref 1.005–1.030)
pH: 6.5 (ref 5.0–8.0)

## 2016-02-12 LAB — DIFFERENTIAL
BASOS ABS: 0 10*3/uL (ref 0.0–0.1)
Basophils Relative: 0 %
EOS ABS: 0 10*3/uL (ref 0.0–0.7)
EOS PCT: 0 %
LYMPHS PCT: 21 %
Lymphs Abs: 1.5 10*3/uL (ref 0.7–4.0)
Monocytes Absolute: 1 10*3/uL (ref 0.1–1.0)
Monocytes Relative: 14 %
NEUTROS PCT: 65 %
Neutro Abs: 4.5 10*3/uL (ref 1.7–7.7)

## 2016-02-12 LAB — URINE MICROSCOPIC-ADD ON: RBC / HPF: NONE SEEN RBC/hpf (ref 0–5)

## 2016-02-12 LAB — CBC
HCT: 39.2 % (ref 39.0–52.0)
HEMOGLOBIN: 13.6 g/dL (ref 13.0–17.0)
MCH: 31.2 pg (ref 26.0–34.0)
MCHC: 34.7 g/dL (ref 30.0–36.0)
MCV: 89.9 fL (ref 78.0–100.0)
Platelets: 45 10*3/uL — ABNORMAL LOW (ref 150–400)
RBC: 4.36 MIL/uL (ref 4.22–5.81)
RDW: 15.6 % — ABNORMAL HIGH (ref 11.5–15.5)
WBC: 6.2 10*3/uL (ref 4.0–10.5)

## 2016-02-12 LAB — ETHANOL: Alcohol, Ethyl (B): 42 mg/dL — ABNORMAL HIGH (ref <5)

## 2016-02-12 LAB — TROPONIN I
Troponin I: <0.03 ng/mL (ref <0.03)
Troponin I: <0.03 ng/mL (ref <0.03)

## 2016-02-12 LAB — MRSA PCR SCREENING: MRSA by PCR: NEGATIVE

## 2016-02-12 LAB — I-STAT TROPONIN, ED: Troponin i, poc: 0 ng/mL (ref 0.00–0.08)

## 2016-02-12 LAB — LIPASE, BLOOD: LIPASE: 78 U/L — AB (ref 11–51)

## 2016-02-12 LAB — TYPE AND SCREEN
ABO/RH(D): O POS
Antibody Screen: NEGATIVE

## 2016-02-12 LAB — PROTIME-INR
INR: 1.19
Prothrombin Time: 15.1 seconds (ref 11.4–15.2)

## 2016-02-12 LAB — POC OCCULT BLOOD, ED: FECAL OCCULT BLD: POSITIVE — AB

## 2016-02-12 MED ORDER — ASPIRIN EC 81 MG PO TBEC: 81.0000 mg | DELAYED_RELEASE_TABLET | Freq: Every day | ORAL | Status: DC

## 2016-02-12 MED ORDER — LORAZEPAM 2 MG/ML IJ SOLN: 1.0000 mg | Freq: Four times a day (QID) | INTRAMUSCULAR | Status: DC | PRN

## 2016-02-12 MED ORDER — LORAZEPAM 1 MG PO TABS: 0.0000 mg | ORAL_TABLET | Freq: Four times a day (QID) | ORAL | Status: DC

## 2016-02-12 MED ORDER — FAMOTIDINE IN NACL 20-0.9 MG/50ML-% IV SOLN
20.0000 mg | Freq: Two times a day (BID) | INTRAVENOUS | Status: DC
  Administered 2016-02-12 – 2016-02-13 (×3): 20 mg via INTRAVENOUS
  Filled 2016-02-12 (×3): qty 50

## 2016-02-12 MED ORDER — ADULT MULTIVITAMIN W/MINERALS CH
1.0000 | ORAL_TABLET | Freq: Every day | ORAL | Status: DC
  Administered 2016-02-13 – 2016-02-14 (×2): 1 via ORAL
  Filled 2016-02-12 (×2): qty 1

## 2016-02-12 MED ORDER — LORAZEPAM 1 MG PO TABS: 0.0000 mg | ORAL_TABLET | Freq: Two times a day (BID) | ORAL | Status: DC

## 2016-02-12 MED ORDER — FOLIC ACID 1 MG PO TABS
1.0000 mg | ORAL_TABLET | Freq: Every day | ORAL | Status: DC
  Administered 2016-02-12: 1 mg via ORAL
  Filled 2016-02-12: qty 1

## 2016-02-12 MED ORDER — ASPIRIN 300 MG RE SUPP: 300.0000 mg | RECTAL | Status: AC

## 2016-02-12 MED ORDER — ADULT MULTIVITAMIN W/MINERALS CH
1.0000 | ORAL_TABLET | Freq: Every day | ORAL | Status: DC
  Administered 2016-02-12: 1 via ORAL
  Filled 2016-02-12: qty 1

## 2016-02-12 MED ORDER — ONDANSETRON HCL 4 MG/2ML IJ SOLN
4.0000 mg | Freq: Four times a day (QID) | INTRAMUSCULAR | Status: DC | PRN
  Administered 2016-02-12 – 2016-02-14 (×2): 4 mg via INTRAVENOUS
  Filled 2016-02-12 (×2): qty 2

## 2016-02-12 MED ORDER — VITAMIN B-1 100 MG PO TABS: 100.0000 mg | ORAL_TABLET | Freq: Every day | ORAL | Status: DC

## 2016-02-12 MED ORDER — SODIUM CHLORIDE 0.9 % IV SOLN
INTRAVENOUS | Status: DC
  Administered 2016-02-12 – 2016-02-14 (×5): via INTRAVENOUS

## 2016-02-12 MED ORDER — THIAMINE HCL 100 MG/ML IJ SOLN
100.0000 mg | Freq: Once | INTRAMUSCULAR | Status: AC
  Administered 2016-02-12: 100 mg via INTRAVENOUS
  Filled 2016-02-12: qty 2

## 2016-02-12 MED ORDER — LORAZEPAM 2 MG/ML IJ SOLN
1.0000 mg | Freq: Once | INTRAMUSCULAR | Status: AC
  Administered 2016-02-12: 1 mg via INTRAVENOUS
  Filled 2016-02-12: qty 1

## 2016-02-12 MED ORDER — NITROGLYCERIN 0.4 MG SL SUBL: 0.4000 mg | SUBLINGUAL_TABLET | SUBLINGUAL | Status: DC | PRN

## 2016-02-12 MED ORDER — SODIUM CHLORIDE 0.9 % IV BOLUS (SEPSIS)
1000.0000 mL | Freq: Once | INTRAVENOUS | Status: AC
  Administered 2016-02-12: 1000 mL via INTRAVENOUS

## 2016-02-12 MED ORDER — ASPIRIN 81 MG PO CHEW
324.0000 mg | CHEWABLE_TABLET | ORAL | Status: AC
  Administered 2016-02-12: 324 mg via ORAL
  Filled 2016-02-12: qty 4

## 2016-02-12 MED ORDER — LORAZEPAM 2 MG/ML IJ SOLN
2.0000 mg | INTRAMUSCULAR | Status: DC | PRN
  Administered 2016-02-12 – 2016-02-14 (×4): 2 mg via INTRAVENOUS
  Filled 2016-02-12 (×5): qty 1

## 2016-02-12 MED ORDER — THIAMINE HCL 100 MG/ML IJ SOLN: 100.0000 mg | Freq: Every day | INTRAMUSCULAR | Status: DC

## 2016-02-12 MED ORDER — ACETAMINOPHEN 325 MG PO TABS
650.0000 mg | ORAL_TABLET | ORAL | Status: DC | PRN
  Administered 2016-02-13 – 2016-02-14 (×2): 650 mg via ORAL
  Filled 2016-02-12 (×2): qty 2

## 2016-02-12 MED ORDER — LORAZEPAM 1 MG PO TABS: 1.0000 mg | ORAL_TABLET | Freq: Four times a day (QID) | ORAL | Status: DC | PRN

## 2016-02-12 NOTE — ED Triage Notes (Signed)
Pt to ER BIB [facility] with complaint of central lower chest pain/epigastric pain radiating to back. Pt reports he was walking when he suddenly felt chest pain and proceeded to pass out and next remembers waking up in the grass. Pt reports hx of same and being hospitalized in [place] for "heart condition." pt unable to elaborate, poor historian. VSS. A/o x4.

## 2016-02-12 NOTE — H&P (Signed)
Date: 02/12/2016               Patient Name:  Antonio [name] MRN: [id number]  DOB: 03/12/1971 Age / Sex: 45 y.o., male   PCP: No primary care provider on file.         Medical Service: Internal Medicine Teaching Service         Attending Physician: Dr. Annia Belt, MD    First Contact: Dr. Ledell Noss  Pager: [phone]  Second Contact: Dr. Charlott Rakes   Pager: [phone]       After Hours (After 5p/  First Contact Pager: [phone]  weekends / holidays): Second Contact Pager: [phone]   Chief Complaint: chest pain and passing out   History of Present Illness: Antonio [name] is a 45 y.o. 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 At 7am this morning he was walking home from a friends house, 1 block down the road he began to experience chest pain and dizziness then he blacked out and woke up in the grass. He was able to make it back to his friends house where EMS was called. Similar dizziness with syncopal episodes have happened twice in the past, he was in [place] at the time and went to the hospital there but is not sure of what the cause of those episodes were. When asked about the chest pain he points to his lower sternum and epigastrium, it is "poking" in nature, and 10/10 he denies squeezing or pressure sensation. The pain shoots straight through his abdomen to his back, it does not wrap around his body. He did not try anything to relieve the pain and cannot associate any exacerbating factors.  He had 2 episodes of orange vomitus after passing out. He did not see any blood in the vomit. He states his last meal was a sandwich last night, he denies nausea currently and says he is hungry and would like to eat multiple times during our conversation. He has experienced multiple episodes of watery diarrhea since yesterday. He has not noticed blood in his stool.  His spleen was removed 10 years ago as a  result of trauma. Recently he has been feeling a general body weakness and some shortness of breath. For the past 2 months he has been experiencing right upper abdominal pain which is relieved with food. He states he takes Aspirin 81 mg four times per day for general body pain. He has had a vague headache been experiencing a headache for the past couple of weeks. He also reports 10 pound weight loss over the last 6 months. He has had decreased urination recently but denies burning, discharge or blood in his urine.  Mr. [name] has a history of alcohol abuse since he was a teenager. He describes having a sample of his liver taken while he was incarcerated in [place] prior to him receiving some type of medical treatment for his liver disease. He has had prior admissions for alcohol withdrawal involving seizures.  He mentioned that recently he has been so frustrated with being sick he has thought he could kill himself. He does not actively want to commit suicide but states if he did he would take a knife to himself. In the ED he was found to have low platelets, elevated liver transaminase and lipase, and FOBT positive. He had alcohol in his blood and opioids and cocaine in his urine.  Meds:  Takes Aspirin 81 mg, 4 tabs qd   Allergies: Allergies as of 02/12/2016 - Review Complete 02/12/2016  Allergen Reaction Noted  . Fish allergy Itching and Swelling 01/05/2013  . Sulfa antibiotics Swelling 06/01/2011  . Bee venom Swelling 10/05/2013   Past Medical History:  Diagnosis Date  . Alcoholic ([facility])   . Chronic pain syndrome   . Complication of anesthesia    hard to wake up  . Depression   . Hepatitis    hep b and c  . Pain in thoracic spine   . Ruptured spleen    Family History:  Mother and father have passed away, they had a history of arthritis  Maternal grandfather- MI later in life   Social History:  He lives with a roommate. Unemployed at this time. He has been smoking 1 pack of  cigarettes per day since he was a teenager. He began drinking when he was a teenager and currently he drinks 1- 12 pack of beer per day.   Review of Systems: A complete ROS was negative except as per HPI.   Physical Exam: Vitals:   02/12/16 1550 02/12/16 1600 02/12/16 1630 02/12/16 1729  BP:  156/98 137/93   Pulse: 80     Resp:  22 17   Temp:    99.2 F (37.3 C)  TempSrc:    Oral  SpO2:  97% 96%   Weight:    191 lb 5.8 oz (86.8 kg)  Height:    5\' 2"  (1.575 m)   Physical Exam  Constitutional: He is oriented to person, place, and time. He appears well-developed and well-nourished.  HENT:  Head: Normocephalic and atraumatic.  Eyes: Pupils are equal, round, and reactive to light.  Cardiovascular: Normal rate and regular rhythm.   No murmur heard. Pulmonary/Chest: No respiratory distress. He has no wheezes. He has no rales.  Abdominal: Soft. Bowel sounds are normal. He exhibits no distension. There is tenderness. There is no guarding.  RUQ Murphy's sign positive.  Hepatic edge extends about 5 cm past his ribs.  LLQ tenderness.  Midline supraumbilical surgical scar   Musculoskeletal: He exhibits no edema.  Neurological: He is alert and oriented to person, place, and time. He has normal strength.  Psychiatric: He has a normal mood and affect. His behavior is normal. He expresses suicidal ideation.  Extremities: R wrist cast   Labs: CBC:  Recent Labs Lab 02/12/16 0936  WBC 6.2  HGB 13.6  HCT 39.2  MCV 89.9  PLT 45*   Basic Metabolic Panel:  Recent Labs Lab 02/12/16 0936  NA 136  K 3.9  CL 102  CO2 22  GLUCOSE 90  BUN 15  CREATININE 0.73  CALCIUM 8.6*   Cardiac Enzymes:  Recent Labs Lab 02/12/16 0941 02/12/16 1211  TROPONINI  --  <0.03  TROPIPOC 0.00  --    Coagulation Studies:  Recent Labs  02/12/16 0936  LABPROT 15.1  INR 1.19    Liver Function Tests:  Recent Labs Lab 02/12/16 0936  AST 326*  ALT 120*  ALKPHOS 110  BILITOT 3.1*  PROT  8.2*  ALBUMIN 3.3*    Recent Labs Lab 02/12/16 0936  LIPASE 78*   Urine Studies: Urinalysis    Component Value Date/Time   COLORURINE AMBER (A) 02/12/2016 1700   APPEARANCEUR CLEAR 02/12/2016 1700   LABSPEC 1.023 02/12/2016 1700   PHURINE 6.5 02/12/2016 1700   GLUCOSEU NEGATIVE 02/12/2016 1700   HGBUR NEGATIVE 02/12/2016 1700  BILIRUBINUR SMALL (A) 02/12/2016 1700   KETONESUR 40 (A) 02/12/2016 1700   PROTEINUR NEGATIVE 02/12/2016 1700   UROBILINOGEN 1.0 10/04/2012 0320   NITRITE NEGATIVE 02/12/2016 1700   LEUKOCYTESUR TRACE (A) 02/12/2016 1700   Drugs of Abuse     Component Value Date/Time   LABOPIA POSITIVE (A) 02/12/2016 1700   COCAINSCRNUR POSITIVE (A) 02/12/2016 1700   LABBENZ NONE DETECTED 02/12/2016 1700   AMPHETMU NONE DETECTED 02/12/2016 1700   THCU NONE DETECTED 02/12/2016 1700   LABBARB NONE DETECTED 02/12/2016 1700    EKG: EKG: normal sinus rhythm  Imaging: Ct Abdomen Wo Contrast  Result Date: 02/12/2016 CLINICAL DATA:  Abdominal pain and dizziness. EXAM: CT ABDOMEN WITHOUT CONTRAST TECHNIQUE: Multidetector CT imaging of the abdomen was performed following the standard protocol without IV contrast. COMPARISON:  06/06/2010 FINDINGS: Lower chest: Stable small partially calcified granuloma in the left lower lobe. No acute pulmonary findings or worrisome pulmonary lesions. The heart is normal in size. No pericardial effusion. The distal esophagus is grossly normal. Hepatobiliary: Diffuse and marked fatty infiltration of the liver but no focal hepatic lesions or biliary dilatation. The gallbladder is contracted. No common bile duct dilatation. Pancreas: No mass, inflammation or ductal dilatation. Spleen: Status post splenectomy. Adrenals/Urinary Tract: The adrenal glands and kidneys are unremarkable. No renal, ureteral or bladder calculi or mass. Stomach/Bowel: The stomach, duodenum, small bowel and colon are grossly normal without oral contrast. No acute inflammatory  changes, mass lesions or obstructive findings. Vascular/Lymphatic: The aorta is normal in caliber. No atherosclerotic calcifications. Small scattered mesenteric and retroperitoneal lymph nodes but no mass or overt adenopathy. Other: The prostate gland and seminal vesicles are unremarkable. No pelvic mass or adenopathy. No free pelvic fluid collections. No inguinal mass or adenopathy. No abdominal wall hernia or subcutaneous lesions. Musculoskeletal: No significant bony findings. IMPRESSION: No acute abdominal/ pelvic findings, mass lesions or adenopathy on this noncontrast examination. Diffuse and marked fatty infiltration of the liver. Status post splenectomy. Electronically Signed   By: Marijo Sanes M.D.   On: 02/12/2016 15:58   Dg Chest 2 View  Result Date: 02/12/2016 CLINICAL DATA:  Central lower chest pain, epigastric pain radiating to back. EXAM: CHEST  2 VIEW COMPARISON:  07/21/2010 FINDINGS: Heart is upper limits normal in size. Lungs are clear. No effusions or edema. No acute bony abnormality. IMPRESSION: No active cardiopulmonary disease. Electronically Signed   By: Rolm Baptise M.D.   On: 02/12/2016 10:28   Assessment & Plan by Problem: Mr. Lyndale Maragh is a 45 y.o. male with PMH polysubstance abuse with multiple alcohol withdrawal, and s/p splenectomy. Presented today with chest pain alongside a syncopal episode. In the ED he was found to have negative troponins, elevated transaminase, thrombocytopenia, FOBT+, and ketonuria. On exam he had left CVA tenderness and RUQ murphy's sign.   Principal Problem:   Chest pain Active Problems:   Polysubstance abuse   History of splenectomy   Thrombocytopenia (HCC)   Hepatitis C   Spleen absent   Syncope  1. Syncope Neurocardiogenic or vasovagal etiology is possible given his episodes of vomiting and his abdominal pain. Orthostatic hypotension is possible he could be dehydrated given his history of recent diarrhea, etoh abuse could lead to  secondary autonomic neuropathy. We will also need to rule out cardiovascular causes such as paroxysmal arrhythmia cannot be ruled out, EKG in the ED showed no signs of arrhythmia and we will continue cardiac monitoring.  -follow up orthostatics  -monitor telemetry   2. Epigastric  abdominal pain CT abdomen showed no acute abdominal findings but he did have a distended bladder. He has elevated transaminase, a history of hepatitis and has previously had a liver biopsy possibly suggesting a history of cirrhosis.  On exam he has positive murphys sign, and left sided CVA tenderness with no signs of peritoneal inflammation. He does have an elevated lipase 78 however he has an appetite and is asking for food which less suggestive of pancreatitis.  -follow up lipid panel  - pepcid and zofran for possible gastritis   3. Chest pain EKG shows no acute ST changes and initial troponins 0.0 >> <0.03 - follow up final troponin   4. S/p splenectomy - will monitor very closely for any signs of infection   5. Substance abuse- ED workup reveiled recent alcohol, opioids, and cocaine use. He has a history of alcohol abuse with admissions for withdrawal and seizure. His last drink was Sunday 8/20. He received one dose of Ativan 1 mg in the ED and was tremulous on exam.  - CIWA protocol in place  -Multivitamins and thiamine   F NS 125 ml/hr E none N soft diet  DVT Ppx SCDs Code Status FULL   Dispo: Admit patient to Inpatient with expected length of stay greater than 2 midnights.  Signed: Ledell Noss, MD 02/12/2016, 6:49 PM  Pager: [phone]

## 2016-02-12 NOTE — ED Notes (Signed)
Pt now revealing suicidal ideation. Reports "I can't do this anymore, I don't want to do drugs and drink all the time." PA Jessica notified.

## 2016-02-12 NOTE — ED Notes (Signed)
Pt placed on bedpan to have bowel movement. When patient taken off bedpan, stool noted to be dark. PA made aware.

## 2016-02-12 NOTE — ED Notes (Signed)
Pt reports episode of vomiting after awakening from syncopal episode.

## 2016-02-12 NOTE — ED Provider Notes (Signed)
Medical screening examination/treatment/procedure(s) were conducted as a shared visit with non-physician practitioner(s) and myself.  I personally evaluated the patient during the encounter.  45 yo M here with CP, LOC and suicidality. Found to have likely GI bleed and along with his chest pain, liver failure needs echocardiogram, ACS rule out prior to psychiatric evaluation so will admit to hospital for same.    EKG Interpretation  Date/Time:  Tuesday February 12 2016 08:59:19 EDT Ventricular Rate:  63 PR Interval:    QRS Duration: 105 QT Interval:  436 QTC Calculation: 447 R Axis:   76 Text Interpretation:  Sinus rhythm Minimal ST elevation, anterior leads T wave inversions in anterior leads new since july 2015 Confirmed by [facility] MD, Edgar Reisz [phone]) on 02/12/2016 10:06:43 AM         Merrily Pew, MD 02/12/16 1606

## 2016-02-12 NOTE — ED Provider Notes (Signed)
[facility] DEPT Provider Note   CSN: [id number] Arrival date & time: 02/12/16  [id number]     History   Chief Complaint Chief Complaint  Patient presents with  . Chest Pain  . Loss of Consciousness    HPI Antonio [name] is a 45 y.o. male.  HPI   Patient is a 45 year old male with a history of a alcohol abuse, chronic pain, depression, hepatitis B & C, splenectomy who presents the emergency department via EMS after a syncopal episode. Patient states he was walking to his friend's house when he began to experience sharp chest pain and dizziness and he felt himself passing out. Patient passed out in the grass. When he woke he states one episode of emesis. He denies hematemesis. Associated fatigue, headache, abdominal pain. Chest pain is constant, sharp, nonradiating, worse with deep breathing. Patient has had 4-5 episodes of syncope in the last year. First episode patient was in [place] which he was hospitalized. Patient endorses alcoholism. He states his last drink was 2 days ago to which he also took Norco to "get high". He denies other drug use. Patient states these episodes of syncope occur when the he is not drinking. He states that he doesn't pass out when he is drinking. Patient denies numbness/timing, weakness, visual changes, cough, fever, chills.  Past Medical History:  Diagnosis Date  . Alcoholic ([place])   . Chronic pain syndrome   . Complication of anesthesia    hard to wake up  . Depression   . Hepatitis    hep b and c  . Pain in thoracic spine   . Ruptured spleen     Patient Active Problem List   Diagnosis Date Noted  . Opioid dependence ([place]) 10/06/2012  . Unspecified episodic mood disorder 10/06/2012  . Alcohol dependence ([place]) 10/06/2012  . Polysubstance abuse 02/24/2012  . Chronic back pain 02/24/2012  . Withdrawal from opioids ([place]) 02/24/2012  . Alcohol withdrawal ([place]) 02/24/2012  . 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 Thrombocytopenia ([place])  02/24/2012  . Neutropenia- low relative neutrophil count 02/24/2012  . Dehydration with hypernatremia 02/24/2012  . Elevated CPK 02/24/2012  . Metabolic encephalopathy 2/2 alcohol and opiods/dehydration 02/24/2012  . Depression 02/24/2012  . Hepatitis B 02/24/2012  . Hepatitis C 02/24/2012  . Alcohol abuse, daily use 06/02/2011    Past Surgical History:  Procedure Laterality Date  . NO PAST SURGERIES     spleen removed 5 yrs ago  . ORIF ANKLE FRACTURE Left 10/06/2013   Procedure: LEFT ANKLE FRACTURE OPEN TREATMENT BILMALLEOLAR ANKLE INCLUDES INTERNAL FIXATION, LEFT ANKLE FRACTURE OPEN TREATMENT DISTAL TIBIOFIBULAR INCLUDES INTERNAL FIXATION ;  Surgeon: Renette Butters, MD;  Location: [facility];  Service: Orthopedics;  Laterality: Left;  . spllenectomy         Home Medications    Prior to Admission medications   Medication Sig Start Date End Date Taking? Authorizing Provider  aspirin 81 MG tablet Take 1 tablet (81 mg total) by mouth daily. 10/06/13   Renette Butters, MD  docusate sodium (COLACE) 100 MG capsule Take 1 capsule (100 mg total) by mouth 2 (two) times daily. Continue this while taking narcotics to help with bowel movements 10/06/13   Renette Butters, MD  gabapentin (NEURONTIN) 300 MG capsule Take 1 capsule (300 mg total) by mouth 3 (three) times daily. For anxiety/pain control 10/11/12   Encarnacion Slates, NP  HYDROmorphone (DILAUDID) 2 MG tablet Take 1 tablet (2 mg total) by mouth every  4 (four) hours as needed for severe pain. 10/06/13   Renette Butters, MD  methadone (DOLOPHINE) 10 MG tablet Take 10 mg by mouth daily.    Historical Provider, MD  naproxen (NAPROSYN) 500 MG tablet Take 1 tablet (500 mg total) by mouth 2 (two) times daily. 01/29/16   Hope Bunnie Pion, NP  ondansetron (ZOFRAN) 4 MG tablet Take 1 tablet (4 mg total) by mouth every 8 (eight) hours as needed for nausea. 10/06/13   Renette Butters, MD  oxyCODONE (ROXICODONE) 5 MG immediate release tablet  Take 2 tablets (10 mg total) by mouth every 4 (four) hours as needed for severe pain. 10/06/13   Renette Butters, MD  ramipril (ALTACE) 2.5 MG capsule Take 2.5 mg by mouth daily.    Historical Provider, MD  traMADol (ULTRAM) 50 MG tablet Take 1 tablet (50 mg total) by mouth every 6 (six) hours as needed. 01/29/16   Hope Bunnie Pion, NP    Family History History reviewed. No pertinent family history.  Social History Social History  Substance Use Topics  . Smoking status: Current Every Day Smoker    Packs/day: 1.00    Years: 15.00    Types: Cigarettes  . Smokeless tobacco: Never Used  . Alcohol use 50.4 oz/week    84 Cans of beer per week     Allergies   Fish allergy; Sulfa antibiotics; and Bee venom   Review of Systems Review of Systems  Constitutional: Negative for chills and fever.  HENT: Negative for trouble swallowing and voice change.   Eyes: Negative for visual disturbance.  Respiratory: Negative for cough.   Cardiovascular: Positive for chest pain. Negative for leg swelling.  Gastrointestinal: Positive for abdominal pain, diarrhea, nausea and vomiting. Negative for blood in stool.  Genitourinary: Negative for discharge, dysuria and hematuria.  Skin: Negative for rash and wound.  Neurological: Positive for dizziness, syncope and headaches. Negative for speech difficulty, weakness and numbness.  Psychiatric/Behavioral: Negative for confusion.     Physical Exam Updated Vital Signs BP 115/83   Pulse 77   Ht 5\' 6"  (1.676 m)   Wt 66.2 kg   SpO2 98%   BMI 23.57 kg/m   Physical Exam  Constitutional: He appears well-developed and well-nourished. No distress.  HENT:  Head: Normocephalic and atraumatic.  Eyes: Conjunctivae are normal.  Neck: Normal range of motion.  Cardiovascular: Normal rate, regular rhythm and normal heart sounds.  Exam reveals no gallop and no friction rub.   No murmur heard. Pulses:      Radial pulses are 2+ on the left side.       Dorsalis pedis  pulses are 2+ on the right side, and 2+ on the left side.  Cast on right arm, cannot assess radial pulse  Pulmonary/Chest: Effort normal and breath sounds normal. No respiratory distress. He has no decreased breath sounds. He has no wheezes. He has no rhonchi. He has no rales. He exhibits tenderness. He exhibits no crepitus and no edema.    No ecchymosis noted  Abdominal: Normal appearance and bowel sounds are normal. There is generalized tenderness. There is no rigidity.  Musculoskeletal: Normal range of motion. He exhibits no edema.  Neurological: He is alert. Coordination normal.  Skin: Skin is warm and dry. He is not diaphoretic.  Psychiatric: He has a normal mood and affect. His behavior is normal.  Nursing note and vitals reviewed.     ED Treatments / Results  Labs (all labs ordered are listed,  but only abnormal results are displayed) Labs Reviewed  CBC - Abnormal; Notable for the following:       Result Value   RDW 15.6 (*)    Platelets 45 (*)    All other components within normal limits  COMPREHENSIVE METABOLIC PANEL - Abnormal; Notable for the following:    Calcium 8.6 (*)    Total Protein 8.2 (*)    Albumin 3.3 (*)    AST 326 (*)    ALT 120 (*)    Total Bilirubin 3.1 (*)    All other components within normal limits  LIPASE, BLOOD - Abnormal; Notable for the following:    Lipase 78 (*)    All other components within normal limits  POC OCCULT BLOOD, ED - Abnormal; Notable for the following:    Fecal Occult Bld POSITIVE (*)    All other components within normal limits  URINALYSIS, ROUTINE W REFLEX MICROSCOPIC (NOT AT [facility])  URINE RAPID DRUG SCREEN, HOSP PERFORMED  I-STAT TROPOININ, ED    EKG  EKG Interpretation  Date/Time:  Tuesday February 12 2016 08:59:19 EDT Ventricular Rate:  63 PR Interval:    QRS Duration: 105 QT Interval:  436 QTC Calculation: 447 R Axis:   76 Text Interpretation:  Sinus rhythm Minimal ST elevation, anterior leads T wave inversions in  anterior leads new since july 2015 Confirmed by [facility] MD, JASON [phone]) on 02/12/2016 10:06:43 AM       Radiology Dg Chest 2 View  Result Date: 02/12/2016 CLINICAL DATA:  Central lower chest pain, epigastric pain radiating to back. EXAM: CHEST  2 VIEW COMPARISON:  07/21/2010 FINDINGS: Heart is upper limits normal in size. Lungs are clear. No effusions or edema. No acute bony abnormality. IMPRESSION: No active cardiopulmonary disease. Electronically Signed   By: Rolm Baptise M.D.   On: 02/12/2016 10:28    Procedures Procedures (including critical care time)  Medications Ordered in ED Medications  sodium chloride 0.9 % bolus 1,000 mL (0 mLs Intravenous Stopped 02/12/16 1043)    And  0.9 %  sodium chloride infusion ( Intravenous New Bag/Given 02/12/16 1047)  LORazepam (ATIVAN) injection 1 mg (1 mg Intravenous Given 02/12/16 0943)  thiamine (B-1) injection 100 mg (100 mg Intravenous Given 02/12/16 0944)     Initial Impression / Assessment and Plan / ED Course  I have reviewed the triage vital signs and the nursing notes.  Pertinent labs & imaging results that were available during my care of the patient were reviewed by me and considered in my medical decision making (see chart for details).  Clinical Course   Concern for cardiac etiology of Chest Pain. Hospitalist has been consulted and will see patient in the ED for likely admit. Pt does not meet criteria for CP protocol and a further evaluation is recommended.   Acute abnormalities found on EKG. First round of cardiac enzymes negative. Chest x-ray with no acute abnormalities. Patient with history of alcoholism. Elevated liver enzymes and low platelets. Patient with positive fecal occult. Patient with likely GI bleed likely 2/2 to alcoholism with history of abdominal pain. Lipase slightly elevated.  Hospitalist team will admit pt for further cardiac workup and treatment. Thank you to the hospitalist team for your time, consult and care  of this patient.   This case was discussed with Dr. Dayna Barker who has seen the patient and agrees with plan to admit.    Final Clinical Impressions(s) / ED Diagnoses   Final diagnoses:  Syncope, unspecified syncope type  Chest pain, unspecified chest pain type    New Prescriptions New Prescriptions   No medications on file     Kalman Drape, [place] 02/12/16 1242    Merrily Pew, MD 02/12/16 [phone]

## 2016-02-12 NOTE — ED Notes (Signed)
Admitting at bedside 

## 2016-02-13 DIAGNOSIS — D696 Thrombocytopenia, unspecified: Secondary | ICD-10-CM

## 2016-02-13 DIAGNOSIS — F1094 Alcohol use, unspecified with alcohol-induced mood disorder: Secondary | ICD-10-CM

## 2016-02-13 DIAGNOSIS — R339 Retention of urine, unspecified: Secondary | ICD-10-CM

## 2016-02-13 DIAGNOSIS — R0789 Other chest pain: Secondary | ICD-10-CM

## 2016-02-13 DIAGNOSIS — F1910 Other psychoactive substance abuse, uncomplicated: Secondary | ICD-10-CM

## 2016-02-13 DIAGNOSIS — R079 Chest pain, unspecified: Secondary | ICD-10-CM | POA: Diagnosis present

## 2016-02-13 LAB — CBC
HCT: 37.9 % — ABNORMAL LOW (ref 39.0–52.0)
HEMOGLOBIN: 12.9 g/dL — AB (ref 13.0–17.0)
MCH: 30.7 pg (ref 26.0–34.0)
MCHC: 34 g/dL (ref 30.0–36.0)
MCV: 90.2 fL (ref 78.0–100.0)
PLATELETS: 44 10*3/uL — AB (ref 150–400)
RBC: 4.2 MIL/uL — ABNORMAL LOW (ref 4.22–5.81)
RDW: 15.7 % — ABNORMAL HIGH (ref 11.5–15.5)
WBC: 6.7 10*3/uL (ref 4.0–10.5)

## 2016-02-13 LAB — COMPREHENSIVE METABOLIC PANEL
ALK PHOS: 96 U/L (ref 38–126)
ALT: 87 U/L — ABNORMAL HIGH (ref 17–63)
ANION GAP: 5 (ref 5–15)
AST: 237 U/L — ABNORMAL HIGH (ref 15–41)
Albumin: 2.8 g/dL — ABNORMAL LOW (ref 3.5–5.0)
BUN: 15 mg/dL (ref 6–20)
CALCIUM: 7.9 mg/dL — AB (ref 8.9–10.3)
CO2: 23 mmol/L (ref 22–32)
CREATININE: 0.7 mg/dL (ref 0.61–1.24)
Chloride: 107 mmol/L (ref 101–111)
Glucose, Bld: 111 mg/dL — ABNORMAL HIGH (ref 65–99)
Potassium: 4.7 mmol/L (ref 3.5–5.1)
SODIUM: 135 mmol/L (ref 135–145)
Total Bilirubin: 3.1 mg/dL — ABNORMAL HIGH (ref 0.3–1.2)
Total Protein: 7.3 g/dL (ref 6.5–8.1)

## 2016-02-13 LAB — HIV ANTIBODY (ROUTINE TESTING W REFLEX): HIV SCREEN 4TH GENERATION: NONREACTIVE

## 2016-02-13 LAB — LIPID PANEL
CHOLESTEROL: 155 mg/dL (ref 0–200)
HDL: 32 mg/dL — ABNORMAL LOW (ref >40)
LDL CALC: 111 mg/dL — AB (ref 0–99)
Total CHOL/HDL Ratio: 4.8 RATIO
Triglycerides: 58 mg/dL (ref <150)
VLDL: 12 mg/dL (ref 0–40)

## 2016-02-13 LAB — HEPATITIS B SURFACE ANTIGEN: HEP B S AG: NEGATIVE

## 2016-02-13 LAB — HEPATITIS B E ANTIGEN: Hep B E Ag: NEGATIVE

## 2016-02-13 LAB — TROPONIN I

## 2016-02-13 LAB — GLUCOSE, CAPILLARY: Glucose-Capillary: 102 mg/dL — ABNORMAL HIGH (ref 65–99)

## 2016-02-13 MED ORDER — TRAZODONE HCL 50 MG PO TABS
50.0000 mg | ORAL_TABLET | Freq: Every day | ORAL | Status: DC
  Administered 2016-02-13 – 2016-02-18 (×6): 50 mg via ORAL
  Filled 2016-02-13 (×7): qty 1

## 2016-02-13 MED ORDER — FOLIC ACID 1 MG PO TABS
1.0000 mg | ORAL_TABLET | Freq: Every day | ORAL | Status: DC
  Administered 2016-02-13 – 2016-02-14 (×2): 1 mg via ORAL
  Filled 2016-02-13 (×2): qty 1

## 2016-02-13 MED ORDER — FLUOXETINE HCL 20 MG PO CAPS
20.0000 mg | ORAL_CAPSULE | Freq: Every day | ORAL | Status: DC
Start: 2016-02-13 — End: 2016-02-20
  Administered 2016-02-13 – 2016-02-19 (×7): 20 mg via ORAL
  Filled 2016-02-13 (×7): qty 1

## 2016-02-13 MED ORDER — THIAMINE HCL 100 MG/ML IJ SOLN
100.0000 mg | Freq: Every day | INTRAMUSCULAR | Status: DC
  Administered 2016-02-13: 100 mg via INTRAVENOUS
  Filled 2016-02-13: qty 2

## 2016-02-13 NOTE — Progress Notes (Signed)
Attempted to obtain orthostatic VS. Patient unable to tolerate standing Blood pressure. He was very weak, trembles, shaky and began to cry because he could not tolerate standing. Very afraid that he was going to fall even though I reassured him I would not let him do so.

## 2016-02-13 NOTE — Consult Note (Signed)
[facility] Consult   Reason for Consult:  Depression and polysubstance abuse Referring Physician:  Dr. Beryle Beams Patient Identification: Antonio [name] MRN:  [id number] Principal Diagnosis: Alcoholic gastritis Diagnosis:   Patient Active Problem List   Diagnosis Date Noted  . Alcoholic gastritis [R74.08] 02/12/2016  . 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 Syncope [R55] 02/12/2016  . Opioid dependence ([place]) [F11.20] 10/06/2012  . Unspecified episodic mood disorder [F39] 10/06/2012  . Alcohol dependence ([place]) [F10.20] 10/06/2012  . Polysubstance abuse [F19.10] 02/24/2012  . Chronic back pain [M54.9, G89.29] 02/24/2012  . Withdrawal from opioids ([place]) [F11.23] 02/24/2012  . Alcohol withdrawal ([place]) [F10.239] 02/24/2012  . 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 Thrombocytopenia ([place]) [D69.6] 02/24/2012  . Neutropenia- low relative neutrophil count [D70.9] 02/24/2012  . Dehydration with hypernatremia [E87.0] 02/24/2012  . Elevated CPK [R74.8] 02/24/2012  . Metabolic encephalopathy 2/2 alcohol and opiods/dehydration [G93.41] 02/24/2012  . Depression [F32.9] 02/24/2012  . Hepatitis B [B16.9] 02/24/2012  . Hepatitis C [B19.20] 02/24/2012  . Alcohol abuse, daily use [F10.10] 06/02/2011    Total Time spent with patient: 1 hour  Subjective:   Antonio [name] is a 45 y.o. male patient admitted with history of recent fall, vomiting, chest pain and polysubstance intoxication.  HPI:  This is a 45 years old male admitted to [facility] with recent fall while walking, vomiting, chest pain nonradiating and polysubstance intoxication with alcohol, cocaine and opiates. Patient seen face-to-face for this psychiatric consultation and evaluation for possible depression and suicidal ideation. Patient reported he needs help for substance abuse inpatient rehabilitation because he cannot stop drinking or doing drugs himself. Patient reported he has a significant other  with him in [place] and his ex-wife and several of the children lives in [place]. 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 Patient reportedly drinks 24 cans of beer a day and cocaine whenever he can get and also pain pills. Patient states he has too many problems related to substance abuse, family, finances and work. Patient denies active suicidal ideation, intention or plans but speaks he does not know how to deal with his multiple problems especially talking about substance abuse. [facility] and able to stay sober about one year before he got relapse. Patient also endorses feeling depressed while intoxicated. Patient has no evidence of psychosis or current withdrawal symptoms or opioid withdrawal symptoms today. Patient feels restless being in medical inpatient and wishes to go to substance abuse inpatient treatment. Patient contract for safety.  Medical history: Patient is a 45 year old male with a history of a alcohol abuse, chronic pain, depression, hepatitis B & C, splenectomy who presents the emergency department via EMS after a syncopal episode. Patient states he was walking to his friend's house when he began to experience sharp chest pain and dizziness and he felt himself passing out. Patient passed out in the grass. When he woke he states one episode of emesis. He denies hematemesis. Associated fatigue, headache, abdominal pain. Chest pain is constant, sharp, nonradiating, worse with deep breathing. Patient has had 4-5 episodes of syncope in the last year. First episode patient was in [place] which he was hospitalized. Patient endorses alcoholism. He states his last drink was 2 days ago to which he also took Norco to "get high". He denies other drug use. Patient states these episodes of syncope occur when the he is not drinking.  He states that he  doesn't pass out when he is drinking. Patient denies numbness/timing, weakness, visual changes, cough, fever, chills  Past Psychiatric History: Patient has polysubstance abuse and alcohol intoxication and previously treated at [facility] in 2001 and 2014. Patient is also reportedly received substance abuse treatment in the past and able to stay sober for 1 year.  Risk to Self: Is patient at risk for suicide?: No Risk to Others:   Prior Inpatient Therapy:   Prior Outpatient Therapy:    Past Medical History:  Past Medical History:  Diagnosis Date  . Alcoholic ([place])   . Chronic pain syndrome   . Complication of anesthesia    hard to wake up  . Depression   . Hepatitis    hep b and c  . Pain in thoracic spine   . Ruptured spleen     Past Surgical History:  Procedure Laterality Date  . NO PAST SURGERIES     spleen removed 5 yrs ago  . ORIF ANKLE FRACTURE Left 10/06/2013   Procedure: LEFT ANKLE FRACTURE OPEN TREATMENT BILMALLEOLAR ANKLE INCLUDES INTERNAL FIXATION, LEFT ANKLE FRACTURE OPEN TREATMENT DISTAL TIBIOFIBULAR INCLUDES INTERNAL FIXATION ;  Surgeon: Renette Butters, MD;  Location: [facility];  Service: Orthopedics;  Laterality: Left;  . spllenectomy     Family History: History reviewed. No pertinent family history. Family Psychiatric  History: Patient denied family history of mental illness. Social History:  History  Alcohol Use  . 50.4 oz/week  . 84 Cans of beer per week     History  Drug Use No    Comment: heroin quit 5 years    Social History   Social History  . Marital status: Single    Spouse name: N/A  . Number of children: N/A  . Years of education: N/A   Social History Main Topics  . Smoking status: Current Every Day Smoker    Packs/day: 1.00    Years: 15.00    Types: Cigarettes  . Smokeless tobacco: Never Used  . Alcohol use 50.4 oz/week    84 Cans of beer per week  . Drug use: No     Comment: heroin quit 5 years  .  Sexual activity: Not Currently   Other Topics Concern  . None   Social History Narrative  . None   Additional Social History:    Allergies:   Allergies  Allergen Reactions  . Fish Allergy Itching and Swelling  . Sulfa Antibiotics Swelling  . Bee Venom Swelling    Labs:  Results for orders placed or performed during the hospital encounter of 02/12/16 (from the past 48 hour(s))  CBC     Status: Abnormal   Collection Time: 02/12/16  9:36 AM  Result Value Ref Range   WBC 6.2 4.0 - 10.5 K/uL   RBC 4.36 4.22 - 5.81 MIL/uL   Hemoglobin 13.6 13.0 - 17.0 g/dL   HCT 39.2 39.0 - 52.0 %   MCV 89.9 78.0 - 100.0 fL   MCH 31.2 26.0 - 34.0 pg   MCHC 34.7 30.0 - 36.0 g/dL   RDW 15.6 (H) 11.5 - 15.5 %   Platelets 45 (L) 150 - 400 K/uL    Comment: REPEATED TO VERIFY PLATELET COUNT CONFIRMED BY SMEAR   Comprehensive metabolic panel     Status: Abnormal   Collection Time: 02/12/16  9:36 AM  Result Value Ref Range   Sodium 136 135 - 145 mmol/L   Potassium 3.9 3.5 -  5.1 mmol/L   Chloride 102 101 - 111 mmol/L   CO2 22 22 - 32 mmol/L   Glucose, Bld 90 65 - 99 mg/dL   BUN 15 6 - 20 mg/dL   Creatinine, Ser 0.73 0.61 - 1.24 mg/dL   Calcium 8.6 (L) 8.9 - 10.3 mg/dL   Total Protein 8.2 (H) 6.5 - 8.1 g/dL   Albumin 3.3 (L) 3.5 - 5.0 g/dL   AST 326 (H) 15 - 41 U/L   ALT 120 (H) 17 - 63 U/L   Alkaline Phosphatase 110 38 - 126 U/L   Total Bilirubin 3.1 (H) 0.3 - 1.2 mg/dL   GFR calc non Af Amer >60 >60 mL/min   GFR calc Af Amer >60 >60 mL/min    Comment: (NOTE) The eGFR has been calculated using the CKD EPI equation. This calculation has not been validated in all clinical situations. eGFR's persistently <60 mL/min signify possible Chronic Kidney Disease.    Anion gap 12 5 - 15  Lipase, blood     Status: Abnormal   Collection Time: 02/12/16  9:36 AM  Result Value Ref Range   Lipase 78 (H) 11 - 51 U/L  Protime-INR     Status: None   Collection Time: 02/12/16  9:36 AM  Result Value Ref  Range   Prothrombin Time 15.1 11.4 - 15.2 seconds   INR 1.19   I-stat troponin, ED (not at [facility], [facility])     Status: None   Collection Time: 02/12/16  9:41 AM  Result Value Ref Range   Troponin i, poc 0.00 0.00 - 0.08 ng/mL   Comment 3            Comment: Due to the release kinetics of cTnI, a negative result within the first hours of the onset of symptoms does not rule out myocardial infarction with certainty. If myocardial infarction is still suspected, repeat the test at appropriate intervals.   POC occult blood, ED RN will collect     Status: Abnormal   Collection Time: 02/12/16 10:16 AM  Result Value Ref Range   Fecal Occult Bld POSITIVE (A) NEGATIVE  Ethanol     Status: Abnormal   Collection Time: 02/12/16 12:11 PM  Result Value Ref Range   Alcohol, Ethyl (B) 42 (H) <5 mg/dL    Comment:        LOWEST DETECTABLE LIMIT FOR SERUM ALCOHOL IS 5 mg/dL FOR MEDICAL PURPOSES ONLY   Troponin I     Status: None   Collection Time: 02/12/16 12:11 PM  Result Value Ref Range   Troponin I <0.03 <0.03 ng/mL  Type and screen [place]     Status: None   Collection Time: 02/12/16  3:09 PM  Result Value Ref Range   ABO/RH(D) O POS    Antibody Screen NEG    Sample Expiration 02/15/2016   Urinalysis, Routine w reflex microscopic (not at [facility])     Status: Abnormal   Collection Time: 02/12/16  5:00 PM  Result Value Ref Range   Color, Urine AMBER (A) YELLOW    Comment: BIOCHEMICALS MAY BE AFFECTED BY COLOR   APPearance CLEAR CLEAR   Specific Gravity, Urine 1.023 1.005 - 1.030   pH 6.5 5.0 - 8.0   Glucose, UA NEGATIVE NEGATIVE mg/dL   Hgb urine dipstick NEGATIVE NEGATIVE   Bilirubin Urine SMALL (A) NEGATIVE   Ketones, ur 40 (A) NEGATIVE mg/dL   Protein, ur NEGATIVE NEGATIVE mg/dL   Nitrite NEGATIVE NEGATIVE  Leukocytes, UA TRACE (A) NEGATIVE  Urine rapid drug screen (hosp performed)     Status: Abnormal   Collection Time: 02/12/16  5:00 PM  Result Value Ref Range    Opiates POSITIVE (A) NONE DETECTED   Cocaine POSITIVE (A) NONE DETECTED   Benzodiazepines NONE DETECTED NONE DETECTED   Amphetamines NONE DETECTED NONE DETECTED   Tetrahydrocannabinol NONE DETECTED NONE DETECTED   Barbiturates NONE DETECTED NONE DETECTED    Comment:        DRUG SCREEN FOR MEDICAL PURPOSES ONLY.  IF CONFIRMATION IS NEEDED FOR ANY PURPOSE, NOTIFY LAB WITHIN 5 DAYS.        LOWEST DETECTABLE LIMITS FOR URINE DRUG SCREEN Drug Class       Cutoff (ng/mL) Amphetamine      1000 Barbiturate      200 Benzodiazepine   295 Tricyclics       621 Opiates          300 Cocaine          300 THC              50   Urine microscopic-add on     Status: Abnormal   Collection Time: 02/12/16  5:00 PM  Result Value Ref Range   Squamous Epithelial / LPF 0-5 (A) NONE SEEN   WBC, UA 0-5 0 - 5 WBC/hpf   RBC / HPF NONE SEEN 0 - 5 RBC/hpf   Bacteria, UA RARE (A) NONE SEEN  MRSA PCR Screening     Status: None   Collection Time: 02/12/16  5:41 PM  Result Value Ref Range   MRSA by PCR NEGATIVE NEGATIVE    Comment:        The GeneXpert MRSA Assay (FDA approved for NASAL specimens only), is one component of a comprehensive MRSA colonization surveillance program. It is not intended to diagnose MRSA infection nor to guide or monitor treatment for MRSA infections.   Troponin I     Status: None   Collection Time: 02/12/16  6:25 PM  Result Value Ref Range   Troponin I <0.03 <0.03 ng/mL  HIV antibody     Status: None   Collection Time: 02/12/16  6:25 PM  Result Value Ref Range   HIV Screen 4th Generation wRfx Non Reactive Non Reactive    Comment: (NOTE) Performed At: [facility] [place], [place] [id number] Lindon Romp MD [id number]   Hepatitis B surface antigen     Status: None   Collection Time: 02/12/16  6:25 PM  Result Value Ref Range   Hepatitis B Surface Ag Negative Negative    Comment: (NOTE) Performed At: [facility] [place], [place] [id number] Lindon Romp MD [id number]   Hepatitis B e antigen     Status: None   Collection Time: 02/12/16  6:25 PM  Result Value Ref Range   Hep B E Ag Negative Negative    Comment: (NOTE) Performed At: [facility] [place], [place] [id number] Lindon Romp MD [id number]   Differential     Status: None   Collection Time: 02/12/16  6:25 PM  Result Value Ref Range   Neutrophils Relative % 65 %   Neutro Abs 4.5 1.7 - 7.7 K/uL   Lymphocytes Relative 21 %   Lymphs Abs 1.5 0.7 - 4.0 K/uL   Monocytes Relative 14 %   Monocytes Absolute 1.0 0.1 - 1.0 K/uL   Eosinophils Relative 0 %  Eosinophils Absolute 0.0 0.0 - 0.7 K/uL   Basophils Relative 0 %   Basophils Absolute 0.0 0.0 - 0.1 K/uL  Troponin I     Status: None   Collection Time: 02/13/16 12:11 AM  Result Value Ref Range   Troponin I <0.03 <0.03 ng/mL  CBC     Status: Abnormal   Collection Time: 02/13/16 12:11 AM  Result Value Ref Range   WBC 6.7 4.0 - 10.5 K/uL   RBC 4.20 (L) 4.22 - 5.81 MIL/uL   Hemoglobin 12.9 (L) 13.0 - 17.0 g/dL   HCT 37.9 (L) 39.0 - 52.0 %   MCV 90.2 78.0 - 100.0 fL   MCH 30.7 26.0 - 34.0 pg   MCHC 34.0 30.0 - 36.0 g/dL   RDW 15.7 (H) 11.5 - 15.5 %   Platelets 44 (L) 150 - 400 K/uL    Comment: REPEATED TO VERIFY CONSISTENT WITH PREVIOUS RESULT   Comprehensive metabolic panel     Status: Abnormal   Collection Time: 02/13/16 12:11 AM  Result Value Ref Range   Sodium 135 135 - 145 mmol/L   Potassium 4.7 3.5 - 5.1 mmol/L    Comment: DELTA CHECK NOTED SPECIMEN HEMOLYZED. HEMOLYSIS MAY AFFECT INTEGRITY OF RESULTS.    Chloride 107 101 - 111 mmol/L   CO2 23 22 - 32 mmol/L   Glucose, Bld 111 (H) 65 - 99 mg/dL   BUN 15 6 - 20 mg/dL   Creatinine, Ser 0.70 0.61 - 1.24 mg/dL   Calcium 7.9 (L) 8.9 - 10.3 mg/dL   Total Protein 7.3 6.5 - 8.1 g/dL   Albumin 2.8 (L) 3.5 - 5.0 g/dL   AST 237 (H) 15 - 41 U/L   ALT 87 (H) 17 - 63 U/L   Alkaline  Phosphatase 96 38 - 126 U/L   Total Bilirubin 3.1 (H) 0.3 - 1.2 mg/dL   GFR calc non Af Amer >60 >60 mL/min   GFR calc Af Amer >60 >60 mL/min    Comment: (NOTE) The eGFR has been calculated using the CKD EPI equation. This calculation has not been validated in all clinical situations. eGFR's persistently <60 mL/min signify possible Chronic Kidney Disease.    Anion gap 5 5 - 15  Lipid panel     Status: Abnormal   Collection Time: 02/13/16 12:11 AM  Result Value Ref Range   Cholesterol 155 0 - 200 mg/dL   Triglycerides 58 <150 mg/dL   HDL 32 (L) >40 mg/dL   Total CHOL/HDL Ratio 4.8 RATIO   VLDL 12 0 - 40 mg/dL   LDL Cholesterol 111 (H) 0 - 99 mg/dL    Comment:        Total Cholesterol/HDL:CHD Risk Coronary Heart Disease Risk Table                     Men   Women  1/2 Average Risk   3.4   3.3  Average Risk       5.0   4.4  2 X Average Risk   9.6   7.1  3 X Average Risk  23.4   11.0        Use the calculated Patient Ratio above and the CHD Risk Table to determine the patient's CHD Risk.        ATP III CLASSIFICATION (LDL):  <100     mg/dL   Optimal  100-129  mg/dL   Near or Above  Optimal  130-159  mg/dL   Borderline  160-189  mg/dL   High  >190     mg/dL   Very High   Glucose, capillary     Status: Abnormal   Collection Time: 02/13/16 12:11 PM  Result Value Ref Range   Glucose-Capillary 102 (H) 65 - 99 mg/dL    Current Facility-Administered Medications  Medication Dose Route Frequency Provider Last Rate Last Dose  . 0.9 %  sodium chloride infusion   Intravenous Continuous Kalman Drape, PA 125 mL/hr at 02/13/16 0538    . acetaminophen (TYLENOL) tablet 650 mg  650 mg Oral Q4H PRN Norman Herrlich, MD      . famotidine (PEPCID) IVPB 20 mg premix  20 mg Intravenous Q12H Riccardo Dubin, MD   20 mg at 02/13/16 0950  . LORazepam (ATIVAN) injection 2-3 mg  2-3 mg Intravenous Q1H PRN Riccardo Dubin, MD   2 mg at 02/13/16 0235  . multivitamin with minerals  tablet 1 tablet  1 tablet Oral Daily Riccardo Dubin, MD   1 tablet at 02/13/16 0950  . nitroGLYCERIN (NITROSTAT) SL tablet 0.4 mg  0.4 mg Sublingual Q5 Min x 3 PRN Norman Herrlich, MD      . ondansetron [facility]) injection 4 mg  4 mg Intravenous Q6H PRN Norman Herrlich, MD   4 mg at 02/12/16 1610    Musculoskeletal: Strength & Muscle Tone: decreased Gait & Station: unable to stand Patient leans: N/A  Psychiatric Specialty Exam: Physical Exam as per history and physical   ROS depression, substance abuse, recent strategic chest pain, headache, nausea and vomiting. Patient denied chest pain and shortness of breath today.  No Fever-chills, No Headache, No changes with Vision or hearing, reports vertigo No problems swallowing food or Liquids, No Chest pain, Cough or Shortness of Breath, No Abdominal pain, No Nausea or Vommitting, Bowel movements are regular, No Blood in stool or Urine, No dysuria, No new skin rashes or bruises, No new joints pains-aches,  No new weakness, tingling, numbness in any extremity, No recent weight gain or loss, No polyuria, polydypsia or polyphagia,   A full 10 point Review of Systems was done, except as stated above, all other Review of Systems were negative.  Blood pressure (!) 145/91, pulse (!) 45, temperature 98.8 F (37.1 C), temperature source Oral, resp. rate 17, height 5' 2"  (1.575 m), weight 86.8 kg (191 lb 5.8 oz), SpO2 98 %.Body mass index is 35 kg/m.  General Appearance: Disheveled and Guarded  Eye Contact:  Good  Speech:  Clear and Coherent  Volume:  Decreased  Mood:  Anxious and Depressed  Affect:  Depressed and Labile  Thought Process:  Coherent and Goal Directed  Orientation:  Full (Time, Place, and Person)  Thought Content:  Logical and Rumination  Suicidal Thoughts:  No  Homicidal Thoughts:  No  Memory:  Immediate;   Good Recent;   Fair Remote;   Fair  Judgement:  Impaired  Insight:  Good  Psychomotor Activity:  Restlessness   Concentration:  Concentration: Fair and Attention Span: Fair  Recall:  Good  Fund of Knowledge:  Good  Language:  Good  Akathisia:  Negative  Handed:  Right  AIMS (if indicated):     Assets:  Communication Skills Desire for Improvement Housing Intimacy Leisure Time Resilience Social Support Transportation  ADL's:  Intact  Cognition:  WNL  Sleep:        Treatment Plan Summary: This is a  45 years old [place] male admitted with complaints of chest pain, polysubstance intoxication and reported vomiting. Patient is known to behavioral [facility] with the 2 previous hospitalization for detoxification for alcohol and opiates. Patient endorses depression and desperation about getting treatment but denies active or passive suicidal ideation, intention or plans. Patient has no evidence of psychosis. Patient urine drug screen is positive for cocaine, opiates and alcohol level is 34 on admission. Patient has highly elevated liver enzymes with AST and ALT.  Safety concern: Patient contract for safety and denies active suicidal/homicidal ideation, intention or plans.  Substance-induced mood disorder: We start fluoxetine 20 mg daily   Insomnia: We start trazodone 50 mg at bedtime for insomnia  Polysubstance dependence: Patient will be monitored for the withdrawal symptoms and will continue Ativan detox treatment and supportive therapy  Referred to the unit social service regarding residential substance abuse rehabilitation program including [facility] and/or [facility].  Appreciate psychiatric consultation and we sign off as of today Please contact [phone] or [phone] if needs further assistance   Disposition: Patient does not meet criteria for psychiatric inpatient admission. Supportive therapy provided about ongoing stressors.  Ambrose Finland, MD 02/13/2016 2:47 PM

## 2016-02-13 NOTE — Progress Notes (Signed)
Subjective: Today [name] has been feeling shaky and nervous. He has decreased appetite at this time, his breakfast plate is sitting in front of him with food untouched. He says he is feeling weak and has been experiencing a headache. He reports that he feels things crawling on him and has been imagining someone coming in and hitting him during the night. He states he has been able to urinate and denies any burning.   Objective:  Vital signs in last 24 hours: Vitals:   02/13/16 0230 02/13/16 0444 02/13/16 0822 02/13/16 1210  BP:  (!) 143/91 121/74 (!) 145/91  Pulse: (!) 50  (!) 47 (!) 45  Resp:  19 (!) 23 17  Temp:  98 F (36.7 C) 98.2 F (36.8 C) 98.8 F (37.1 C)  TempSrc:  Axillary Axillary Oral  SpO2:  98% 98% 98%  Weight:      Height:       Physical Exam  Constitutional: He appears well-developed and well-nourished. No distress.  HENT:  Tongue tremor   Eyes: Pupils are equal, round, and reactive to light.  Bilateral arcus senilis   Cardiovascular: Normal rate and regular rhythm.  Exam reveals no friction rub.   No murmur heard. Pulmonary/Chest: Effort normal. No respiratory distress. He has no wheezes. He has no rales.  Abdominal: Soft. He exhibits no distension and no mass. There is tenderness. There is guarding. There is no rebound.  Diffuse tenderness and guarding   Musculoskeletal: He exhibits no edema.  Neurological: He is alert. No cranial nerve deficit.  Some diffuse muscle weakness   Skin: Skin is warm and dry.  Telangectasia over chest     Labs: CBC:  Recent Labs Lab 02/12/16 0936 02/12/16 1825 02/13/16 0011  WBC 6.2  --  6.7  NEUTROABS  --  4.5  --   HGB 13.6  --  12.9*  HCT 39.2  --  37.9*  MCV 89.9  --  90.2  PLT 45*  --  44*   Metabolic Panel:  Recent Labs Lab 02/12/16 0936 02/13/16 0011  NA 136 135  K 3.9 4.7  CL 102 107  CO2 22 23  GLUCOSE 90 111*  BUN 15 15  CREATININE 0.73 0.70  CALCIUM 8.6* 7.9*  ALT 120* 87*    ALKPHOS 110 96  BILITOT 3.1* 3.1*  PROT 8.2* 7.3  ALBUMIN 3.3* 2.8*  LIPASE 78*  --   LABPROT 15.1  --   INR 1.19  --    Cardiac Labs:  Recent Labs Lab 02/12/16 0941 02/12/16 1211 02/12/16 1825 02/13/16 0011  TROPIPOC 0.00  --   --   --   TROPONINI  --  <0.03 <0.03 <0.03   Hepatitis B surface Ag 8/22 negative  Hepatitis B E antigen 8/22 negative   Imaging: CT abdomen 8/22 No acute abdominal/ pelvic findings, mass lesions or adenopathy on this noncontrast examination. Diffuse and marked fatty infiltration of the liver. Status post splenectomy.    Medications: Infusions: . sodium chloride 125 mL/hr at 02/13/16 0538   Scheduled Medications: . famotidine (PEPCID) IV  20 mg Intravenous Q12H  . FLUoxetine  20 mg Oral Daily  . folic acid  1 mg Oral Daily  . multivitamin with minerals  1 tablet Oral Daily  . thiamine injection  100 mg Intravenous Daily  . traZODone  50 mg Oral QHS   PRN Medications: acetaminophen, LORazepam, nitroGLYCERIN, ondansetron (ZOFRAN) IV  Assessment/Plan: Pt is a 45 y.o. yo male with a  PMHx of  polysubstance abuse with multiple alcohol withdrawal, and s/p splenectomy who was admitted on 02/12/2016 with symptoms of abdominal pain followed by syncope, which was determined to be secondary to gastritis. Interventions at this time will be focused on monitoring his alcohol withdrawal and treating his abdominal symptoms.   Principal Problem:   Alcoholic gastritis Active Problems:   Polysubstance abuse   Alcohol withdrawal (HCC)   History of splenectomy   Thrombocytopenia (HCC)   Hepatitis C   Spleen absent   Syncope  Epigastric abdominal pain CT abdomen showed no acute abdominal findings but he did have a distended bladder. Today he has worsening diffuse abdominal tenderness and guarding. In the ED he was found to have elevated lipase 78 however he had an appetite and was asking for food. Today he has decreased appetite. Elevated lipase with FOBT  + in the setting of alcohol abuse and chronic aspirin 81 mg four times per day make be related to a peptic ulcer. Amylase is normal, lipid panel TAGs were within normal limits and he has no history of gallstones, at this point it is unlikely that his abdominal pain is related to acute pancreatitis.  -continue pepcid and zofran   Fatty liver CT scan showed diffuse fatty infiltration. On exam his liver can be palpated 5 cm past the rib margin. Hepatitis B serologies are negative. His fatty liver is most likely a result of his chronic alcohol abuse.  -follow up Hepatitis C serologies   Syncope Neurocardiogenic/vasovagal etiology is possible given his episodes of vomiting abdominal pain. Orthostatic hypotension is possible he could be dehydrated given his history of recent diarrhea, etoh abuse which could lead to secondary autonomic neuropathy. We will also need to rule out cardiovascular causes such as paroxysmal arrhythmia we will continue cardiac monitoring.  -follow up orthostatics  -monitor telemetry    Urinary retention Bladder scan 500 ml with straight cath 325 ml evacuated via straight cath, retention may be related to substance abuse.   S/p splenectomy will monitor very closely for any signs of infection   Alcohol withdrawal -ED workup reveiled recent alcohol, opioids, and cocaine use. He has a history of alcohol abuse with admissions for withdrawal and seizure. His last drink was Sunday 8/20. He had CIWA scores 12-19 overnight and was given 3 doses of ativan.  -continue Multivitamin and thiamine   Chest pain EKG shows no acute ST changes. Peak troponin <0.03. This chest pain is non ischemic and likely related to his abdominal symptoms.   Thrombocytopenia- likely related to his chronic liver disease and alcohol abuse    Suicidal ideation- He has had no active suicidal gestures at this time. Psychology consulted and we appreciate their recommendations, he was started on Fluoxetine 20mg  qd  and Trazodonoe 50 mcg qHS.   Dispo: Anticipated discharge in approximately 2-4 day(s).   LOS: 0 days   Ledell Noss, MD 02/13/2016, 4:06 PM Pager: [phone]

## 2016-02-14 DIAGNOSIS — F10239 Alcohol dependence with withdrawal, unspecified: Secondary | ICD-10-CM

## 2016-02-14 LAB — CBC
HCT: 40.1 % (ref 39.0–52.0)
Hemoglobin: 13.5 g/dL (ref 13.0–17.0)
MCH: 30.8 pg (ref 26.0–34.0)
MCHC: 33.7 g/dL (ref 30.0–36.0)
MCV: 91.6 fL (ref 78.0–100.0)
PLATELETS: 51 10*3/uL — AB (ref 150–400)
RBC: 4.38 MIL/uL (ref 4.22–5.81)
RDW: 15.6 % — ABNORMAL HIGH (ref 11.5–15.5)
WBC: 7.1 10*3/uL (ref 4.0–10.5)

## 2016-02-14 LAB — BASIC METABOLIC PANEL
Anion gap: 5 (ref 5–15)
BUN: 7 mg/dL (ref 6–20)
CALCIUM: 8.5 mg/dL — AB (ref 8.9–10.3)
CO2: 23 mmol/L (ref 22–32)
CREATININE: 0.65 mg/dL (ref 0.61–1.24)
Chloride: 108 mmol/L (ref 101–111)
GFR calc Af Amer: >60 mL/min (ref >60)
GFR calc non Af Amer: >60 mL/min (ref >60)
GLUCOSE: 121 mg/dL — AB (ref 65–99)
Potassium: 3.7 mmol/L (ref 3.5–5.1)
SODIUM: 136 mmol/L (ref 135–145)

## 2016-02-14 LAB — HEPATIC FUNCTION PANEL
ALT: 96 U/L — ABNORMAL HIGH (ref 17–63)
AST: 195 U/L — ABNORMAL HIGH (ref 15–41)
Albumin: 2.7 g/dL — ABNORMAL LOW (ref 3.5–5.0)
Alkaline Phosphatase: 97 U/L (ref 38–126)
BILIRUBIN INDIRECT: 1.6 mg/dL — AB (ref 0.3–0.9)
Bilirubin, Direct: 0.9 mg/dL — ABNORMAL HIGH (ref 0.1–0.5)
TOTAL PROTEIN: 7.1 g/dL (ref 6.5–8.1)
Total Bilirubin: 2.5 mg/dL — ABNORMAL HIGH (ref 0.3–1.2)

## 2016-02-14 MED ORDER — LORAZEPAM 1 MG PO TABS: 1.0000 mg | ORAL_TABLET | Freq: Four times a day (QID) | ORAL | Status: AC | PRN

## 2016-02-14 MED ORDER — VITAMIN B-1 100 MG PO TABS
100.0000 mg | ORAL_TABLET | Freq: Every day | ORAL | Status: DC
  Administered 2016-02-15 – 2016-02-19 (×5): 100 mg via ORAL
  Filled 2016-02-14 (×5): qty 1

## 2016-02-14 MED ORDER — PANTOPRAZOLE SODIUM 40 MG PO TBEC
40.0000 mg | DELAYED_RELEASE_TABLET | Freq: Every day | ORAL | Status: DC
  Administered 2016-02-15: 40 mg via ORAL
  Filled 2016-02-14: qty 1

## 2016-02-14 MED ORDER — FAMOTIDINE 20 MG PO TABS
20.0000 mg | ORAL_TABLET | Freq: Two times a day (BID) | ORAL | Status: DC
  Administered 2016-02-14: 20 mg via ORAL
  Filled 2016-02-14: qty 1

## 2016-02-14 MED ORDER — ADULT MULTIVITAMIN W/MINERALS CH
1.0000 | ORAL_TABLET | Freq: Every day | ORAL | Status: DC
  Administered 2016-02-15 – 2016-02-19 (×5): 1 via ORAL
  Filled 2016-02-14 (×5): qty 1

## 2016-02-14 MED ORDER — VITAMIN B-1 100 MG PO TABS
100.0000 mg | ORAL_TABLET | Freq: Every day | ORAL | Status: DC
  Administered 2016-02-14: 100 mg via ORAL
  Filled 2016-02-14: qty 1

## 2016-02-14 MED ORDER — FOLIC ACID 1 MG PO TABS
1.0000 mg | ORAL_TABLET | Freq: Every day | ORAL | Status: DC
  Administered 2016-02-15 – 2016-02-19 (×5): 1 mg via ORAL
  Filled 2016-02-14 (×5): qty 1

## 2016-02-14 MED ORDER — LORAZEPAM 2 MG/ML IJ SOLN: 1.0000 mg | Freq: Four times a day (QID) | INTRAMUSCULAR | Status: AC | PRN

## 2016-02-14 MED ORDER — THIAMINE HCL 100 MG/ML IJ SOLN
100.0000 mg | Freq: Every day | INTRAMUSCULAR | Status: DC
  Filled 2016-02-14 (×2): qty 2

## 2016-02-14 MED ORDER — PANTOPRAZOLE SODIUM 40 MG PO TBEC: 40.0000 mg | DELAYED_RELEASE_TABLET | Freq: Every day | ORAL | Status: DC

## 2016-02-14 MED ORDER — SUCRALFATE 1 G PO TABS
1.0000 g | ORAL_TABLET | Freq: Three times a day (TID) | ORAL | Status: DC
  Administered 2016-02-14 – 2016-02-19 (×22): 1 g via ORAL
  Filled 2016-02-14 (×23): qty 1

## 2016-02-14 NOTE — Progress Notes (Signed)
Report called to [facility], [place] receiving nurse. Transferred out floor via wheelchair
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
## 2016-02-14 NOTE — Progress Notes (Signed)
Subjective: [name] states he is feeling much better today. He denies nausea and states he is feeling less shaky. He feels his appetite has improved significantly and is eating breakfast. He continues to experience abdominal pain and diffuse weakness. Overnight he did experience some visual hallucinations.   Objective:  Vital signs in last 24 hours: Vitals:   02/14/16 0338 02/14/16 0600 02/14/16 0816 02/14/16 1000  BP: (!) 137/92 (!) 113/50 114/76 122/69  Pulse: (!) 59 61 (!) 58 63  Resp: (!) 21  (!) 21 18  Temp: 99.9 F (37.7 C)  98.5 F (36.9 C)   TempSrc: Oral  Oral   SpO2:   97% 97%  Weight:      Height:        Physical Exam  Constitutional: He appears well-developed and well-nourished. No distress.  Eyes: Pupils are equal, round, and reactive to light.  Cardiovascular: Normal rate and regular rhythm.   No murmur heard. Pulmonary/Chest: Effort normal. He has no wheezes. He has no rales.  Abdominal: Soft. Bowel sounds are normal. He exhibits no distension. There is tenderness. There is guarding.  Neurological: He is alert.  Skin: Skin is warm and dry. He is not diaphoretic.   Labs: CBC:  Recent Labs Lab 02/12/16 0936 02/12/16 1825 02/13/16 0011 02/14/16 0218  WBC 6.2  --  6.7 7.1  NEUTROABS  --  4.5  --   --   HGB 13.6  --  12.9* 13.5  HCT 39.2  --  37.9* 40.1  MCV 89.9  --  90.2 91.6  PLT 45*  --  44* 51*   Metabolic Panel:  Recent Labs Lab 02/12/16 0936 02/13/16 0011 02/14/16 0218  NA 136 135 136  K 3.9 4.7 3.7  CL 102 107 108  CO2 22 23 23   GLUCOSE 90 111* 121*  BUN 15 15 7   CREATININE 0.73 0.70 0.65  CALCIUM 8.6* 7.9* 8.5*  ALT 120* 87*  --   ALKPHOS 110 96  --   BILITOT 3.1* 3.1*  --   PROT 8.2* 7.3  --   ALBUMIN 3.3* 2.8*  --   LIPASE 78*  --   --   LABPROT 15.1  --   --   INR 1.19  --   --    Cardiac Labs:  Recent Labs Lab 02/12/16 0941 02/12/16 1211 02/12/16 1825 02/13/16 0011  TROPIPOC 0.00  --   --   --     TROPONINI  --  <0.03 <0.03 <0.03   BG:  Recent Labs Lab 02/13/16 1211  GLUCAP 102*   No results found for: HGBA1C  Microbiology: Hepatitis B surface Ag 8/22 negative  Hepatitis B E antigen 8/22 negative   Imaging: CT abdomen 8/22 No acute abdominal/ pelvic findings, mass lesions or adenopathy on this noncontrast examination. Diffuse and marked fatty infiltration of the liver. Status post splenectomy.    Medications: Infusions: . sodium chloride 125 mL/hr at 02/13/16 2119   Scheduled Medications: . famotidine  20 mg Oral BID  . FLUoxetine  20 mg Oral Daily  . folic acid  1 mg Oral Daily  . multivitamin with minerals  1 tablet Oral Daily  . thiamine  100 mg Oral Daily  . traZODone  50 mg Oral QHS   PRN Medications: acetaminophen, LORazepam, nitroGLYCERIN, ondansetron (ZOFRAN) IV  Assessment/Plan: Pt is a 45 y.o. yo male with a PMHx of polysubstance abuse with multiple alcohol withdrawal, and s/p splenectomy who was admitted on 02/12/2016 with  symptoms of abdominal pain followed by syncope, which was determined to be secondary to gastritis. Interventions at this time will be focused on monitoring his alcohol withdrawal and treating his abdominal symptoms.  Principal Problem:   Alcoholic gastritis Active Problems:   Polysubstance abuse   Alcohol withdrawal (HCC)   History of splenectomy   Thrombocytopenia (HCC)   Hepatitis C   Spleen absent   Syncope   Chest pain  Epigastric abdominal pain Elevated lipase with FOBT + in the setting of alcohol abuse and chronic aspirin 81 mg four times per day make be related to a peptic ulcer. Pepcid switched to protonix today  -continue zofran and protonix   Fatty liver CT scan showed diffuse fatty infiltration. On exam his liver can be palpated 5 cm past the rib margin. Hepatitis B serologies are negative. His fatty liver is most likely a result of his chronic alcohol abuse.  -follow up Hepatitis C serologies    SyncopeNeurocardiogenic/vasovagal etiology is likely given his episodes of vomiting abdominal pain. Orthostatic hypotension is possible, he could be dehydrated given his history of recent diarrhea and etoh abuse which could lead to secondary autonomic neuropathy. Orthostatic blood pressures can not be evaluated at this time due to his weakness with standing. Cardiac monitoring since admission has shown no arrhythmia.  -follow up orthostatics   Urinary retention Bladder scan 500 ml with straight cath 325 ml evacuated via straight cath, retention may be related to substance abuse.   S/p splenectomy will monitor very closely for any signs of infection   Alcohol withdrawal ED workup reveiled recent alcohol, opioids, and cocaine use. He has a history of alcohol abuse with admissions for withdrawal and seizure. His last drink was Sunday 8/20. His CIWA scores have been improving 5-14 overnight.  -continue Multivitamin folate and thiamine   Nonischemic chest painEKG shows no acute ST changes. Peak troponin <0.03. This chest pain is likely related to his abdominal symptoms.   Thrombocytopenia- likely related to his chronic liver disease and alcohol abuse   -follow up CMET   Suicidal ideation- He has had no active suicidal gestures at this time. Psychology consulted and we appreciate their recommendations, he was started on Fluoxetine 20mg  qd and Trazodonoe 50 mcg qHS.   Dispo: Anticipated discharge in approximately 1-2 day(s).   LOS: 1 day   Ledell Noss, MD 02/14/2016, 10:19 AM Pager: [phone]

## 2016-02-15 DIAGNOSIS — K208 Other esophagitis: Secondary | ICD-10-CM

## 2016-02-15 LAB — COMPREHENSIVE METABOLIC PANEL
ALK PHOS: 130 U/L — AB (ref 38–126)
ALT: 99 U/L — ABNORMAL HIGH (ref 17–63)
AST: 151 U/L — ABNORMAL HIGH (ref 15–41)
Albumin: 2.8 g/dL — ABNORMAL LOW (ref 3.5–5.0)
Anion gap: 7 (ref 5–15)
BUN: 7 mg/dL (ref 6–20)
CHLORIDE: 103 mmol/L (ref 101–111)
CO2: 25 mmol/L (ref 22–32)
CREATININE: 0.64 mg/dL (ref 0.61–1.24)
Calcium: 8.7 mg/dL — ABNORMAL LOW (ref 8.9–10.3)
GFR calc Af Amer: >60 mL/min (ref >60)
GFR calc non Af Amer: >60 mL/min (ref >60)
GLUCOSE: 93 mg/dL (ref 65–99)
POTASSIUM: 4 mmol/L (ref 3.5–5.1)
SODIUM: 135 mmol/L (ref 135–145)
TOTAL PROTEIN: 7.4 g/dL (ref 6.5–8.1)
Total Bilirubin: 2.1 mg/dL — ABNORMAL HIGH (ref 0.3–1.2)

## 2016-02-15 LAB — HEPATITIS C GENOTYPE

## 2016-02-15 LAB — HCV RNA QUANT RFLX ULTRA OR GENOTYP
HCV RNA Qnt(log copy/mL): 5.589 log10 IU/mL
HEPATITIS C QUANTITATION: 388000 [IU]/mL

## 2016-02-15 MED ORDER — FAMOTIDINE IN NACL 20-0.9 MG/50ML-% IV SOLN
20.0000 mg | Freq: Two times a day (BID) | INTRAVENOUS | Status: DC
  Administered 2016-02-15 – 2016-02-19 (×9): 20 mg via INTRAVENOUS
  Filled 2016-02-15 (×10): qty 50

## 2016-02-15 MED ORDER — SODIUM CHLORIDE 0.9 % IV SOLN
INTRAVENOUS | Status: DC
  Administered 2016-02-15: 1000 mL via INTRAVENOUS
  Administered 2016-02-16 – 2016-02-17 (×3): via INTRAVENOUS

## 2016-02-15 MED ORDER — HYDROMORPHONE HCL 1 MG/ML IJ SOLN
1.0000 mg | INTRAMUSCULAR | Status: DC | PRN
  Administered 2016-02-15 – 2016-02-18 (×10): 1 mg via INTRAVENOUS
  Filled 2016-02-15 (×10): qty 1

## 2016-02-15 MED ORDER — HYDROMORPHONE HCL 1 MG/ML IJ SOLN
2.0000 mg | Freq: Once | INTRAMUSCULAR | Status: AC
  Administered 2016-02-15: 2 mg via INTRAVENOUS
  Filled 2016-02-15: qty 2

## 2016-02-15 NOTE — Progress Notes (Signed)
Subjective: Antonio [name] is not feeling well today, he has diffuse abdominal pain and no appetite. He was feeling very hungry yesterday but after eating a few meals his abdominal pain and chest pain returned.   Objective:  Vital signs in last 24 hours: Vitals:   02/14/16 2246 02/14/16 2310 02/15/16 0439 02/15/16 1122  BP: (!) 138/92 137/84 140/89 140/86  Pulse: 65 (!) 52 (!) 48 (!) 49  Resp: 20 16 20    Temp: 98.8 F (37.1 C) 97.6 F (36.4 C) 98.1 F (36.7 C) 98.5 F (36.9 C)  TempSrc: Oral Oral Oral   SpO2: 100% 98% 99% 98%  Weight:      Height:       Physical Exam  Constitutional: He appears well-developed and well-nourished. No distress.  Cardiovascular: Normal rate and regular rhythm.   No murmur heard. Pulmonary/Chest: Effort normal. He has no wheezes. He has no rales.  Abdominal: Soft. He exhibits no distension. There is tenderness. There is guarding.  Diffuse tenderness and guarding  No CVA tenderness   Neurological: He is alert.  Skin: He is not diaphoretic.   Labs: CBC:  Recent Labs Lab 02/12/16 0936 02/12/16 1825 02/13/16 0011 02/14/16 0218  WBC 6.2  --  6.7 7.1  NEUTROABS  --  4.5  --   --   HGB 13.6  --  12.9* 13.5  HCT 39.2  --  37.9* 40.1  MCV 89.9  --  90.2 91.6  PLT 45*  --  44* 51*   Metabolic Panel:  Recent Labs Lab 02/12/16 0936 02/13/16 0011 02/14/16 0218 02/15/16 0543  NA 136 135 136 135  K 3.9 4.7 3.7 4.0  CL 102 107 108 103  CO2 22 23 23 25   GLUCOSE 90 111* 121* 93  BUN 15 15 7 7   CREATININE 0.73 0.70 0.65 0.64  CALCIUM 8.6* 7.9* 8.5* 8.7*  ALT 120* 87* 96* 99*  ALKPHOS 110 96 97 130*  BILITOT 3.1* 3.1* 2.5* 2.1*  PROT 8.2* 7.3 7.1 7.4  ALBUMIN 3.3* 2.8* 2.7* 2.8*  LIPASE 78*  --   --   --   LABPROT 15.1  --   --   --   INR 1.19  --   --   --    Cardiac Labs:  Recent Labs Lab 02/12/16 0941 02/12/16 1211 02/12/16 1825 02/13/16 0011  TROPIPOC 0.00  --   --   --   TROPONINI  --  <0.03 <0.03 <0.03    Microbiology: Hepatitis B surface Ag 8/22 negative  Hepatitis B E antigen 8/22 negative   Imaging: CT abdomen8/22 No acute abdominal/ pelvic findings, mass lesions or adenopathy on this noncontrast examination. Diffuse and marked fatty infiltration of the liver. Status post splenectomy.    Medications: Infusions: . sodium chloride 100 mL/hr at 02/15/16 1042   Scheduled Medications: . FLUoxetine  20 mg Oral Daily  . folic acid  1 mg Oral Daily  .  HYDROmorphone (DILAUDID) injection  2 mg Intravenous Once  . multivitamin with minerals  1 tablet Oral Daily  . pantoprazole  40 mg Oral QAC breakfast  . sucralfate  1 g Oral TID WC & HS  . thiamine  100 mg Oral Daily   Or  . thiamine  100 mg Intravenous Daily  . traZODone  50 mg Oral QHS   PRN Medications: acetaminophen, HYDROmorphone (DILAUDID) injection, LORazepam **OR** LORazepam, ondansetron (ZOFRAN) IV  Assessment/Plan: Pt is a 45 y.o.yo malewith a PMHx of polysubstance abuse with  multiple alcohol withdrawal, and s/p splenectomy who was admitted on 8/22/2017with symptoms of abdominal pain followed by syncope, which was determined to be secondary to gastritis and alcoholic pancreatitis. Interventions at this time will be focused on monitoring his alcohol withdrawal and treating his abdominal symptoms.  Principal Problem:   Alcoholic gastritis Active Problems:   Polysubstance abuse   Alcohol withdrawal (HCC)   History of splenectomy   Thrombocytopenia (HCC)   Hepatitis C   Spleen absent   Syncope   Chest pain  Epigastric abdominal pain Elevated lipase with FOBT + in the setting of alcohol abuse and chronic aspirin 81 mg four times per day make peptic ulcer  most likely. From the point of ED admission through yesterday he seemed to be improving significantly however after multiple meals his abdominal pain has now worsened suggesting there is likely a component of acute alcoholic pancreatitis. Today we will start  bowel rest, and pain medications and restart IV fluids.  -continue zofran and pepcid   Fatty liver CT scan showed diffuse fatty infiltration. On exam his liver can be palpated 5 cm past the rib margin. His fatty liver is most likely a result of his chronic alcohol abuse. Hepatitis B serologies are negative. -follow up Hepatitis C serologies   SyncopeNeurocardiogenic/vasovagal etiology is likely given his episodes of vomiting abdominal pain. Orthostatic hypotension is possible, he could be dehydrated given his history of recent diarrhea and etoh abuse which could lead to secondary autonomic neuropathy. Orthostatic blood pressures could not be evaluated due to his weakness with standing. Cardiac monitoring showed no arrhythmia. He has had no syncopal or pre-syncopal episodes since admission.  -follow up orthostatics   S/p splenectomywill monitor very closely for any signs of infection   Alcohol withdrawalED workup reveiled recent alcohol, opioids, and cocaine use. He has a history of alcohol abuse with admissions for withdrawal and seizure.  His CIWA scores have continued to improve to zero most recently.  -continue Multivitamin folate and thiamine   Nonischemic chest painEKG shows no acute ST changes. Peak troponin <0.03. This chest pain is likely related to his abdominal symptoms.   Thrombocytopenia- likely related to his chronic liver disease and alcohol abuse   Suicidal ideation- He has had no active suicidal gestures at this time. Psychology consulted and we appreciate their recommendations, he was started on Fluoxetine 20mg  qd and Trazodonoe 50 mcg qHS and recommended for placement at a rehabilitation facility.   Dispo: Anticipated discharge in approximately 1-2 day(s).   LOS: 2 days   Ledell Noss, MD 02/15/2016, 12:35 PM Pager: [phone]

## 2016-02-15 NOTE — Care Management Note (Signed)
Case Management Note  Patient Details  Name: Antonio [name] MRN: [id number] Date of Birth: December 13, 1970  Subjective/Objective:                 Patient lives at home with spouse. History of polysubstance abuse, ETOH abuse, and [facility] admissions. UDS + cocaine, opiates. Psych consult safe to DC to home with outpatient psych follow up. Patient admitted with gastritis, per nursing staff, patient not eating well, and still has c/o some abd pain.    Action/Plan:  Anticipate DC to home with psych follow up as arraigned by psych CSW.   Expected Discharge Date:                  Expected Discharge Plan:  Home/Self Care  In-House Referral:  Clinical Social Work (Psych follow up)  Discharge planning Services  CM Consult  Post Acute Care Choice:  NA Choice offered to:  NA  DME Arranged:  N/A DME Agency:  NA  HH Arranged:  NA HH Agency:  NA  Status of Service:  Completed, signed off  If discussed at [place] of Stay Meetings, dates discussed:    Additional Comments:  Carles Collet, RN 02/15/2016, 11:44 AM

## 2016-02-15 NOTE — Progress Notes (Signed)
CSW received call from Marcie Bal (pts sponsor NOT wife though they have lived together for  5 years) who is very concerned about patient not having set rehab plan.  CSW explained that patinet has to be assessed at rehab facilities prior to admission and that they often have waiting list- able to obtain appointment at [facility] for Wednesday 7:45am for in person assessment for admission- also able to make referral to [facility] who will put pt on waiting list if appropriate candidate.  Family and pt express understanding that pt will likely have to DC home for short time prior to admission to rehab facility  CSW signing off but encouraged pt and family to reach out if further assistance is needed.  Jorge Ny, [place] Social Worker [phone]

## 2016-02-16 DIAGNOSIS — K859 Acute pancreatitis without necrosis or infection, unspecified: Secondary | ICD-10-CM

## 2016-02-16 LAB — COMPREHENSIVE METABOLIC PANEL
ALT: 118 U/L — ABNORMAL HIGH (ref 17–63)
ANION GAP: 7 (ref 5–15)
AST: 156 U/L — ABNORMAL HIGH (ref 15–41)
Albumin: 2.7 g/dL — ABNORMAL LOW (ref 3.5–5.0)
Alkaline Phosphatase: 112 U/L (ref 38–126)
BUN: 8 mg/dL (ref 6–20)
CHLORIDE: 105 mmol/L (ref 101–111)
CO2: 25 mmol/L (ref 22–32)
CREATININE: 0.66 mg/dL (ref 0.61–1.24)
Calcium: 8.6 mg/dL — ABNORMAL LOW (ref 8.9–10.3)
Glucose, Bld: 86 mg/dL (ref 65–99)
POTASSIUM: 3.7 mmol/L (ref 3.5–5.1)
SODIUM: 137 mmol/L (ref 135–145)
Total Bilirubin: 2 mg/dL — ABNORMAL HIGH (ref 0.3–1.2)
Total Protein: 7 g/dL (ref 6.5–8.1)

## 2016-02-16 NOTE — Progress Notes (Signed)
Subjective: [name] feels his abdominal pain is improving today. He denies any new complaints. He has not had any food over the past 24 hours and is asking to have something to drink at this time.   Objective:  Vital signs in last 24 hours: Vitals:   02/15/16 1713 02/15/16 2213 02/15/16 2357 02/16/16 0459  BP: 114/76 130/87 115/76 (!) 147/82  Pulse: (!) 52 (!) 52 85 (!) 46  Resp:  20  20  Temp:  98 F (36.7 C)  98.6 F (37 C)  TempSrc:  Oral  Oral  SpO2: 98% 98%  98%  Weight:      Height:       Physical Exam  Constitutional: He appears well-developed and well-nourished. No distress.  Cardiovascular: Normal rate and regular rhythm.   No murmur heard. Pulmonary/Chest: No respiratory distress. He has no wheezes. He has no rales.  Abdominal: Soft. He exhibits no distension. There is tenderness. There is no guarding.  Neurological: He is alert.  Extremities: no calf tenderness, no peripheral edema   Labs: CBC:  Recent Labs Lab 02/12/16 0936 02/12/16 1825 02/13/16 0011 02/14/16 0218  WBC 6.2  --  6.7 7.1  NEUTROABS  --  4.5  --   --   HGB 13.6  --  12.9* 13.5  HCT 39.2  --  37.9* 40.1  MCV 89.9  --  90.2 91.6  PLT 45*  --  44* 51*   Metabolic Panel:  Recent Labs Lab 02/12/16 0936 02/13/16 0011 02/14/16 0218 02/15/16 0543  NA 136 135 136 135  K 3.9 4.7 3.7 4.0  CL 102 107 108 103  CO2 22 23 23 25   GLUCOSE 90 111* 121* 93  BUN 15 15 7 7   CREATININE 0.73 0.70 0.65 0.64  CALCIUM 8.6* 7.9* 8.5* 8.7*  ALT 120* 87* 96* 99*  ALKPHOS 110 96 97 130*  BILITOT 3.1* 3.1* 2.5* 2.1*  PROT 8.2* 7.3 7.1 7.4  ALBUMIN 3.3* 2.8* 2.7* 2.8*  LIPASE 78*  --   --   --   LABPROT 15.1  --   --   --   INR 1.19  --   --   --    Microbiology: Hepatitis B surface Ag 8/22 negative  Hepatitis B E antigen 8/22 negative  Hep C viral load 388,000 IU/ml   Imaging: CT abdomen8/22 No acute abdominal/ pelvic findings, mass lesions or adenopathy on this noncontrast  examination. Diffuse and marked fatty infiltration of the liver. Status post splenectomy.    Medications: Infusions: . sodium chloride 1,000 mL (02/15/16 2355)   Scheduled Medications: . famotidine (PEPCID) IV  20 mg Intravenous Q12H  . FLUoxetine  20 mg Oral Daily  . folic acid  1 mg Oral Daily  . multivitamin with minerals  1 tablet Oral Daily  . sucralfate  1 g Oral TID WC & HS  . thiamine  100 mg Oral Daily   Or  . thiamine  100 mg Intravenous Daily  . traZODone  50 mg Oral QHS   PRN Medications: acetaminophen, HYDROmorphone (DILAUDID) injection, LORazepam **OR** LORazepam, ondansetron (ZOFRAN) IV  Assessment/Plan: Pt is a 45 y.o.yo malewith a PMHx of polysubstance abuse with multiple alcohol withdrawal, and s/p splenectomy who was admitted on 8/22/2017with symptoms of abdominal pain followed by syncope, which was determined to be secondary to gastritis and alcoholic pancreatitis. Interventions at this time will be focused on treating his symptoms of gastritis and pancreatitis.   Principal Problem:  Alcoholic gastritis Active Problems:   Polysubstance abuse   Alcohol withdrawal (HCC)   History of splenectomy   Thrombocytopenia (HCC)   Hepatitis C   Spleen absent   Syncope   Chest pain   Pancreatitis, acute  Epigastric abdominal pain On admission he had elevated lipase with FOBT + in the setting of alcohol abuse and chronic aspirin 81 mg four times per day suggest gastritis, esophagitis, and alcoholic pancreatitis as the etiology of his abdominal pain. He was allowed to eat on the day of admission but developed abdominal pain 8/25 so he was placed on bowel rest. Today he feels his abdominal pain has improved and he was asking for ice chips so a clear liquid diet was begun. He is still on IV fluids, dilaudid, zofran and pepcid.   Fatty liver CT scan showed diffuse fatty infiltration. On exam his liver can be palpated 5 cm past the rib margin. His fatty liver is most  likely a result of his chronic alcohol abuse. Hepatitis B serologies are negative. Hep C viral load 388,000 IU/ml.  SyncopeNeurocardiogenic/vasovagal etiology is likelygiven his episodes of vomiting abdominal pain. Orthostatic hypotension is possible,he could be dehydrated given his history of recent diarrhea andetoh abuse which could lead to secondary autonomic neuropathy. Orthostatic blood pressures could not be evaluated due to his weakness with standing. Cardiac monitoring showed no arrhythmia. He has had no syncopal or pre-syncopal episodes since admission.  -follow up orthostatics   S/p splenectomywill continue to monitor very closely for any signs of infection   Alcohol withdrawalED workup reveiled recent alcohol, opioids, and cocaine use. He has a history of alcohol abuse with admissions for withdrawal and seizure.  HisCIWA scores have continued to improve to zero most recently.  -continue Multivitamin folate andthiamine   Thrombocytopenia-  Platelet count is improving. Thrombocytopenia is likely related to his chronic liver disease and alcohol abuse.   Suicidal ideation- He has had no active suicidal gestures at this time. Psychology consulted and we appreciate their recommendations, he was started on Fluoxetine 20mg  qd and Trazodonoe 50 mcg qHS and recommended for placement at a rehabilitation facility.   Dispo: Anticipated discharge in approximately 1-2 day(s).   LOS: 3 days   Ledell Noss, MD 02/16/2016, 7:13 AM Pager: [phone]

## 2016-02-17 ENCOUNTER — Inpatient Hospital Stay (HOSPITAL_COMMUNITY): Payer: Self-pay

## 2016-02-17 DIAGNOSIS — K5900 Constipation, unspecified: Secondary | ICD-10-CM

## 2016-02-17 MED ORDER — POLYETHYLENE GLYCOL 3350 17 G PO PACK: 17.0000 g | PACK | Freq: Every day | ORAL | Status: DC | PRN

## 2016-02-17 MED ORDER — POLYETHYLENE GLYCOL 3350 17 G PO PACK
17.0000 g | PACK | Freq: Once | ORAL | Status: AC
  Administered 2016-02-17: 17 g via ORAL
  Filled 2016-02-17: qty 1

## 2016-02-17 NOTE — Progress Notes (Signed)
Subjective: [name] states that he is having persistent left lower abdominal pain. He has been experiencing this pain off and on since the day of admission. He states that his last bowel movement was 2 days ago. He does report a significant improvement in his epigastric abdominal pain. He has been on a liquid diet for the past day and feels as though he could handle having some food today.   Objective:  Vital signs in last 24 hours: Vitals:   02/16/16 1803 02/17/16 0000 02/17/16 0307 02/17/16 0509  BP: 111/75 121/71  (!) 147/86  Pulse: (!) 54 (!) 49 (!) 52 (!) 46  Resp: (!) 22 20  18   Temp: 97.8 F (36.6 C) 97.9 F (36.6 C)  98.3 F (36.8 C)  TempSrc: Oral Oral  Oral  SpO2: 100% 99%  100%  Weight:      Height:       Physical Exam  Constitutional: He appears well-developed and well-nourished. No distress.  Cardiovascular: Normal rate and regular rhythm.   Pulmonary/Chest: Effort normal. He has no wheezes. He has no rales.  Abdominal: Soft. Bowel sounds are normal. He exhibits no distension. There is tenderness. There is no guarding.  Left lower quadrant tenderness   Neurological: He is alert.  Extremities: no peripheral edema, no calf tenderness   Labs: CBC:  Recent Labs Lab 02/12/16 0936 02/12/16 1825 02/13/16 0011 02/14/16 0218  WBC 6.2  --  6.7 7.1  NEUTROABS  --  4.5  --   --   HGB 13.6  --  12.9* 13.5  HCT 39.2  --  37.9* 40.1  MCV 89.9  --  90.2 91.6  PLT 45*  --  44* 51*   Metabolic Panel:  Recent Labs Lab 02/12/16 0936 02/13/16 0011 02/14/16 0218 02/15/16 0543 02/16/16 0541  NA 136 135 136 135 137  K 3.9 4.7 3.7 4.0 3.7  CL 102 107 108 103 105  CO2 22 23 23 25 25   GLUCOSE 90 111* 121* 93 86  BUN 15 15 7 7 8   CREATININE 0.73 0.70 0.65 0.64 0.66  CALCIUM 8.6* 7.9* 8.5* 8.7* 8.6*  ALT 120* 87* 96* 99* 118*  ALKPHOS 110 96 97 130* 112  BILITOT 3.1* 3.1* 2.5* 2.1* 2.0*  PROT 8.2* 7.3 7.1 7.4 7.0  ALBUMIN 3.3* 2.8* 2.7* 2.8* 2.7*    LIPASE 78*  --   --   --   --   LABPROT 15.1  --   --   --   --   INR 1.19  --   --   --   --    Microbiology: Hepatitis B surface Ag 8/22 negative  Hepatitis B E antigen 8/22 negative  Hep C viral load 388,000 IU/ml   Imaging: CT abdomen8/22 No acute abdominal/ pelvic findings, mass lesions or adenopathy on this noncontrast examination. Diffuse and marked fatty infiltration of the liver. Status post splenectomy.    Medications: Infusions:   Scheduled Medications: . famotidine (PEPCID) IV  20 mg Intravenous Q12H  . FLUoxetine  20 mg Oral Daily  . folic acid  1 mg Oral Daily  . multivitamin with minerals  1 tablet Oral Daily  . polyethylene glycol  17 g Oral Once  . sucralfate  1 g Oral TID WC & HS  . thiamine  100 mg Oral Daily   Or  . thiamine  100 mg Intravenous Daily  . traZODone  50 mg Oral QHS   PRN Medications: acetaminophen,  HYDROmorphone (DILAUDID) injection, LORazepam **OR** LORazepam, ondansetron (ZOFRAN) IV  Assessment/Plan: Pt is a 45 y.o.yo malewith a PMHx of polysubstance abuse with multiple alcohol withdrawal, and s/p splenectomy who was admitted on 8/22/2017with symptoms of abdominal pain followed by syncope, which was determined to be secondary to gastritis and alcoholic pancreatitis. Interventions at this time will be focused on treating his symptoms of gastritis and pancreatitis.   Principal Problem:   Alcoholic gastritis Active Problems:   Polysubstance abuse   Alcohol withdrawal (HCC)   History of splenectomy   Thrombocytopenia (HCC)   Hepatitis C   Spleen absent   Syncope   Chest pain   Pancreatitis, acute  Epigastric abdominal pain On admission he had elevated lipase with FOBT + in the setting of alcohol abuse and chronic aspirin 81 mg four times per day suggest gastritis, esophagitis, and alcoholic pancreatitis as the etiology of his abdominal pain. He was allowed to eat on the day of admission but developed abdominal pain 8/25 so he  was placed on bowel rest. On 8/26 he was started on a clear liquid diet, today he was advanced to soft diet and IV fluids were discontinued. He is still on dilaudid, zofran and pepcid.   Constipation His last bowel movement was 2 days ago and he reports fluctuating left lower quadrant abdominal pain. We will try a dose of Miralax and continue to monitor his abdominal pain and bowel movements.   Fatty liver Secondary to chronic alcohol abuse. Hepatitis B serologies are negative. Hep C viral load 388,000 IU/ml, this will require outpatient follow up.   Syncope  He has had no syncopal or pre-syncopal symptoms since admission. Neurocardiogenic etiology is likelygiven that his syncope occurred after an episodes of vomiting abdominal pain. Orthostatic hypotension is possible etoh abuse which could lead to secondary autonomic neuropathy.  -follow up orthostatics   S/p splenectomywill continue to monitor very closely for any signs of infection   Alcohol withdrawalED workup revealed recent alcohol, opioids, and cocaine use. He has a history of alcohol abuse with admissions for withdrawal and seizure. Initially he had elevatedCIWA scores however they trended to zero.  -continue Multivitamin folate andthiamine   Thrombocytopenia-  Platelet count is improving. Thrombocytopenia is likely related to his chronic liver disease and alcohol abuse.   Suicidal ideation- He has had no active suicidal gestures at this time. Psychology consulted and we appreciate their recommendations, he was started on Fluoxetine 20mg  qd for depression  and Trazodonoe 50 mcg qHS for insomnia and recommended for placement at a rehabilitation facility.   Dispo: Anticipated discharge in approximately 1-2 day(s).   LOS: 4 days   Ledell Noss, MD 02/17/2016, 9:51 AM Pager: [phone]

## 2016-02-17 NOTE — Progress Notes (Signed)
During CIWA assessment pt HR 49. MD notified at [place].

## 2016-02-17 NOTE — Progress Notes (Signed)
Called by nurse around 5:30AM that patient complaining of abdominal pain that is no worse than before and HR in 40-50s. Patient currently sleeping so recommended to hold off on giving medication. Called again at 6:15PM that patient now having worsening pain than previously so I went to evaluate patient.  Patient states his abdominal pain is intermittent and mostly located in LLQ. He reports sometimes the pain is worse than previously but overall stable. He reports last BM 2 days ago. He reports tolerating PO intake with clears. Denies any nausea, vomiting, fevers, chills, or bloody stools.   Physical Exam General: thin man resting in bed, in no acute distress, pleasant HEENT: [place]/AT, EOMI, sclera anicteric, mucus membranes moist CV: RRR, no m/g/r Pulm: CTA bilaterally, breaths non-labored Abd: BS+, soft, non-distended, very minimal tenderness to LLQ. Scar present in midline abdomen, appears well healed. Ext: warm, no edema Neuro: alert and oriented x 3  Assessment/Plan: 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 His abdomen appears benign at this time and there are no concerns for peritonitis. He remains afebrile and BP stable. His CT Abd/Pelvis on 8/22 did not show any acute abnormalities.  CMET yesterday with mildly elevated alk phos and low albumin but otherwise normal. He is tolerating PO intake fine. Suspect that this could be related to constipation given he has not had a bowel movement for a few days. Will obtain a 2 view abdominal x-ray to evaluate for any acute abnormalities and assess stool burden. Will give dilaudid 1 mg IV now for pain control. Day team to reassess in next hour.   Albin Felling, MD, MPH Internal Medicine Resident, PGY-III Pager: [phone]

## 2016-02-17 NOTE — Progress Notes (Signed)
At [phone] Dr. Arcelia Jew at bedside. Pain medicine given, MD aware.

## 2016-02-17 NOTE — Progress Notes (Addendum)
Pt co abdominal pain at 0515, requesting pain medicine. HR sustained in low to mid 40s, Dr. Arcelia Jew notified at [phone]. MD called back at 0535, notified about HR. RN instructed to reassess pain and give pain med if needed, with further monitoring of HR. Pt asleep at 0545.

## 2016-02-18 LAB — COMPREHENSIVE METABOLIC PANEL
ALBUMIN: 2.8 g/dL — AB (ref 3.5–5.0)
ALK PHOS: 115 U/L (ref 38–126)
ALT: 112 U/L — ABNORMAL HIGH (ref 17–63)
ANION GAP: 8 (ref 5–15)
AST: 123 U/L — ABNORMAL HIGH (ref 15–41)
BUN: 9 mg/dL (ref 6–20)
CALCIUM: 8.7 mg/dL — AB (ref 8.9–10.3)
CHLORIDE: 103 mmol/L (ref 101–111)
CO2: 24 mmol/L (ref 22–32)
Creatinine, Ser: 0.58 mg/dL — ABNORMAL LOW (ref 0.61–1.24)
GFR calc Af Amer: >60 mL/min (ref >60)
GFR calc non Af Amer: >60 mL/min (ref >60)
GLUCOSE: 97 mg/dL (ref 65–99)
POTASSIUM: 4 mmol/L (ref 3.5–5.1)
SODIUM: 135 mmol/L (ref 135–145)
TOTAL PROTEIN: 7.3 g/dL (ref 6.5–8.1)
Total Bilirubin: 1.7 mg/dL — ABNORMAL HIGH (ref 0.3–1.2)

## 2016-02-18 LAB — CBC
HCT: 41.4 % (ref 39.0–52.0)
HEMOGLOBIN: 14 g/dL (ref 13.0–17.0)
MCH: 31.1 pg (ref 26.0–34.0)
MCHC: 33.8 g/dL (ref 30.0–36.0)
MCV: 92 fL (ref 78.0–100.0)
Platelets: 86 10*3/uL — ABNORMAL LOW (ref 150–400)
RBC: 4.5 MIL/uL (ref 4.22–5.81)
RDW: 16 % — ABNORMAL HIGH (ref 11.5–15.5)
WBC: 6.6 10*3/uL (ref 4.0–10.5)

## 2016-02-18 MED ORDER — OXYCODONE-ACETAMINOPHEN 5-325 MG PO TABS
1.0000 | ORAL_TABLET | Freq: Four times a day (QID) | ORAL | Status: DC | PRN
  Administered 2016-02-18 – 2016-02-20 (×5): 1 via ORAL
  Filled 2016-02-18 (×6): qty 1

## 2016-02-18 MED ORDER — POLYETHYLENE GLYCOL 3350 17 G PO PACK: 17.0000 g | PACK | Freq: Two times a day (BID) | ORAL | Status: DC

## 2016-02-18 MED ORDER — POLYETHYLENE GLYCOL 3350 17 G PO PACK
17.0000 g | PACK | Freq: Every day | ORAL | Status: DC
  Filled 2016-02-18 (×2): qty 1

## 2016-02-18 NOTE — Care Management Note (Signed)
Case Management Note  Patient Details  Name: Antonio [name] MRN: [id number] Date of Birth: 09/06/1970  Subjective/Objective:           Pt admitted with alcoholic gastritis and pancreatitis. Hx of polysubstance abuse.     Action/Plan: Plan is to d/c to home on  with appointment in place (August 30th @ 7:45am )  regarding rehabilitation @ [facility] for in person assessment for admission.  Expected Discharge Date:    8/39/2017           Expected Discharge Plan:  Home/Self Care  In-House Referral:  Clinical Social Work (Psych follow up)  Discharge planning Services  CM Consult  Post Acute Care Choice:  NA Choice offered to:  NA  DME Arranged:  N/A DME Agency:  NA  HH Arranged:  NA HH Agency:  NA  Status of Service:  Completed, signed off  If discussed at [place] of Stay Meetings, dates discussed:    Additional Comments:  Sharin Mons, RN 02/18/2016, 11:05 AM

## 2016-02-18 NOTE — Progress Notes (Signed)
Subjective: [name] continues to experience abdominal pain today. He says the upper abdominal pain has improved since admission however he continues to experience pain in his lower left side. He does say that his appetite is gradually improving and he thinks he may be able to tolerate trying to eat regular food today.   Objective:  Vital signs in last 24 hours: Vitals:   02/17/16 1840 02/17/16 2100 02/18/16 0100 02/18/16 0559  BP: 116/79 121/74 118/73 140/86  Pulse: (!) 52 (!) 52 (!) 46 (!) 47  Resp: 15 18 17 16   Temp: 98.4 F (36.9 C) 98.6 F (37 C) 97.7 F (36.5 C) 98.3 F (36.8 C)  TempSrc: Oral Oral Oral Oral  SpO2: 100% 99% 99% 98%  Weight:      Height:         Physical Exam  Constitutional: He appears well-developed and well-nourished.  Cardiovascular: Normal rate and regular rhythm.   No murmur heard. Pulmonary/Chest: Effort normal. No respiratory distress. He has no wheezes.  Abdominal: Soft. He exhibits no distension. There is tenderness. There is no guarding.  Left lower quadrant tenderness   Neurological: He is alert.    Labs: CBC:  Recent Labs Lab 02/12/16 0936 02/12/16 1825 02/13/16 0011 02/14/16 0218 02/18/16 0532  WBC 6.2  --  6.7 7.1 6.6  NEUTROABS  --  4.5  --   --   --   HGB 13.6  --  12.9* 13.5 14.0  HCT 39.2  --  37.9* 40.1 41.4  MCV 89.9  --  90.2 91.6 92.0  PLT 45*  --  44* 51* 86*   Metabolic Panel:  Recent Labs Lab 02/12/16 0936 02/13/16 0011 02/14/16 0218 02/15/16 0543 02/16/16 0541 02/18/16 0532  NA 136 135 136 135 137 135  K 3.9 4.7 3.7 4.0 3.7 4.0  CL 102 107 108 103 105 103  CO2 22 23 23 25 25 24   GLUCOSE 90 111* 121* 93 86 97  BUN 15 15 7 7 8 9   CREATININE 0.73 0.70 0.65 0.64 0.66 0.58*  CALCIUM 8.6* 7.9* 8.5* 8.7* 8.6* 8.7*  ALT 120* 87* 96* 99* 118* 112*  ALKPHOS 110 96 97 130* 112 115  BILITOT 3.1* 3.1* 2.5* 2.1* 2.0* 1.7*  PROT 8.2* 7.3 7.1 7.4 7.0 7.3  ALBUMIN 3.3* 2.8* 2.7* 2.8* 2.7* 2.8*    LIPASE 78*  --   --   --   --   --   LABPROT 15.1  --   --   --   --   --   INR 1.19  --   --   --   --   --    Microbiology: Hepatitis B surface Ag 8/22 negative  Hepatitis B E antigen 8/22 negative  Hep C viral load 388,000 IU/ml   Imaging: CT abdomen8/22 No acute abdominal/ pelvic findings, mass lesions or adenopathy on this noncontrast examination. Diffuse and marked fatty infiltration of the liver. Status post splenectomy.    Medications: Infusions:   Scheduled Medications: . famotidine (PEPCID) IV  20 mg Intravenous Q12H  . FLUoxetine  20 mg Oral Daily  . folic acid  1 mg Oral Daily  . multivitamin with minerals  1 tablet Oral Daily  . polyethylene glycol  17 g Oral Daily  . sucralfate  1 g Oral TID WC & HS  . thiamine  100 mg Oral Daily  . traZODone  50 mg Oral QHS   PRN Medications: acetaminophen, ondansetron (ZOFRAN)  IV, oxyCODONE-acetaminophen  Assessment/Plan: Pt is a 45 y.o.yo malewith a PMHx of polysubstance abuse with multiple alcohol withdrawal, and s/p splenectomy who was admitted on 8/22/2017with symptoms of abdominal pain followed by syncope, which was determined to be secondary to gastritis and alcoholic pancreatitis. Interventions at this time will be focused on treating his symptoms of gastritis and pancreatitis.   Principal Problem:   Alcoholic gastritis Active Problems:   Polysubstance abuse   Alcohol withdrawal (HCC)   History of splenectomy   Thrombocytopenia (HCC)   Hepatitis C   Spleen absent   Syncope   Chest pain   Pancreatitis, acute  Gastritis and pancreatitis His abdominal pain is improving but still hasn't resolved completely, today we will advance his diet and switch his IV meds to po and monitor.  - switched IV dilaudid to percocet 5-325 mg q 6 hr  - Continue IV pepcid and carafate   Constipation His last bowel movement was 3 days ago and he reports fluctuating left lower quadrant abdominal pain. We will continue Miralax  and continue to monitor his abdominal pain and bowel movements.   Fatty liver Most likely secondary to chronic alcohol abuse. Elevated transaminase continue to improve. Hepatitis B serologies are negative. Hep C viral load 388,000 IU/ml, this will require outpatient follow up.   S/p splenectomywill continue to monitor very closely for any signs of infection   Alcohol withdrawalED workup revealed recent alcohol, opioids, and cocaine use. He has a history of alcohol abuse with admissions for withdrawal and seizure. Initially he had elevatedCIWA scores however they trended to zero.  -continue Multivitamin folate andthiamine   Thrombocytopenia- Platelet count is improving. Thrombocytopenia is likely related to his chronic liver disease and alcohol abuse.   Suicidal ideation- He has had no active suicidal gestures at this time. Psychology consulted and we appreciate their recommendations, he was started on Fluoxetine 20mg  qd for depression  and Trazodonoe 50 mcg qHS for insomnia and recommended for placement at a rehabilitation facility.   Dispo: Anticipated discharge in approximately 1-2 day(s).   LOS: 5 days   Ledell Noss, MD 02/18/2016, 10:25 AM Pager: [phone]

## 2016-02-19 MED ORDER — FAMOTIDINE 40 MG/5ML PO SUSR: 20.0000 mg | Freq: Two times a day (BID) | ORAL | Status: DC

## 2016-02-19 MED ORDER — FAMOTIDINE 20 MG PO TABS
20.0000 mg | ORAL_TABLET | Freq: Two times a day (BID) | ORAL | Status: DC
  Administered 2016-02-19: 20 mg via ORAL
  Filled 2016-02-19: qty 1

## 2016-02-19 NOTE — Progress Notes (Signed)
RN informed by nurse tech that patient wanted his pain medication. On arrival into patient's room, RN found patient asleep in bed. No pain medication given at this time. Will continue to monitor and treat per MD orders.

## 2016-02-19 NOTE — Progress Notes (Signed)
Subjective: [name] continues to experience abdominal pain. He has been eating and did have a bowel movement yesterday. He is ready to go to rehab and says he's very motivated to stop using drugs.   Objective:  Vital signs in last 24 hours: Vitals:   02/18/16 0559 02/18/16 1344 02/18/16 2230 02/19/16 0618  BP: 140/86 (!) 101/59 102/69 (!) 153/92  Pulse: (!) 47 (!) 56 (!) 57 (!) 59  Resp: 16  20 20   Temp: 98.3 F (36.8 C) 98.7 F (37.1 C) 98 F (36.7 C) 98.6 F (37 C)  TempSrc: Oral  Oral Oral  SpO2: 98% 99% 99% 100%  Weight:      Height:       Physical Exam  Constitutional: He appears well-developed and well-nourished. No distress.  Cardiovascular: Normal rate and regular rhythm.   No murmur heard. Pulmonary/Chest: Effort normal. He has no wheezes. He has no rales.  Abdominal: Soft. He exhibits no distension. There is tenderness. There is no guarding.  Neurological: He is alert.    Labs: CBC:  Recent Labs Lab 02/12/16 0936 02/12/16 1825 02/13/16 0011 02/14/16 0218 02/18/16 0532  WBC 6.2  --  6.7 7.1 6.6  NEUTROABS  --  4.5  --   --   --   HGB 13.6  --  12.9* 13.5 14.0  HCT 39.2  --  37.9* 40.1 41.4  MCV 89.9  --  90.2 91.6 92.0  PLT 45*  --  44* 51* 86*   Metabolic Panel:  Recent Labs Lab 02/12/16 0936 02/13/16 0011 02/14/16 0218 02/15/16 0543 02/16/16 0541 02/18/16 0532  NA 136 135 136 135 137 135  K 3.9 4.7 3.7 4.0 3.7 4.0  CL 102 107 108 103 105 103  CO2 22 23 23 25 25 24   GLUCOSE 90 111* 121* 93 86 97  BUN 15 15 7 7 8 9   CREATININE 0.73 0.70 0.65 0.64 0.66 0.58*  CALCIUM 8.6* 7.9* 8.5* 8.7* 8.6* 8.7*  ALT 120* 87* 96* 99* 118* 112*  ALKPHOS 110 96 97 130* 112 115  BILITOT 3.1* 3.1* 2.5* 2.1* 2.0* 1.7*  PROT 8.2* 7.3 7.1 7.4 7.0 7.3  ALBUMIN 3.3* 2.8* 2.7* 2.8* 2.7* 2.8*  LIPASE 78*  --   --   --   --   --   LABPROT 15.1  --   --   --   --   --   INR 1.19  --   --   --   --   --    BG:  Recent Labs Lab 02/13/16 1211    GLUCAP 102*   No results found for: HGBA1C  Microbiology: Hepatitis B surface Ag 8/22 negative  Hepatitis B E antigen 8/22 negative  Hep C viral load 388,000 IU/ml   Imaging: CT abdomen8/22 No acute abdominal/ pelvic findings, mass lesions or adenopathy on this noncontrast examination. Diffuse and marked fatty infiltration of the liver. Status post splenectomy.    Medications: Infusions:   Scheduled Medications: . famotidine (PEPCID) IV  20 mg Intravenous Q12H  . FLUoxetine  20 mg Oral Daily  . folic acid  1 mg Oral Daily  . multivitamin with minerals  1 tablet Oral Daily  . polyethylene glycol  17 g Oral Daily  . sucralfate  1 g Oral TID WC & HS  . thiamine  100 mg Oral Daily  . traZODone  50 mg Oral QHS   PRN Medications: acetaminophen, ondansetron (ZOFRAN) IV, oxyCODONE-acetaminophen  Assessment/Plan:  Pt is a 45 y.o.yo malewith a PMHx of polysubstance abuse with multiple alcohol withdrawal, and s/p splenectomy who was admitted on 8/22/2017with symptoms of abdominal pain followed by syncope, which was determined to be secondary to gastritis and alcoholic pancreatitis. Interventions at this time will be focused on treating his symptoms of gastritis and pancreatitis.   Principal Problem:   Alcoholic gastritis Active Problems:   Polysubstance abuse   Alcohol withdrawal (HCC)   History of splenectomy   Thrombocytopenia (HCC)   Hepatitis C   Spleen absent   Syncope   Chest pain   Pancreatitis, acute  Gastritis and pancreatitis His abdominal pain is improving but still hasn't resolved completely.  - continue percocet 5-325 mg q 6 hr, pepcid and carafate   ConstipationHe had a bowel movement yesterday after multiple doses of Miralax.   S/p splenectomywill continue to monitor very closely for any signs of infection   Alcohol withdrawalED workup revealed recent alcohol, opioids, and cocaine use. He has a history of alcohol abuse with admissions for  withdrawal and seizure. Initially he had elevatedCIWA scores however they trended tozero.  -continue Multivitamin folate andthiamine   Thrombocytopenia- Platelet count is improving. Thrombocytopenia is likely related to his chronic liver disease and alcohol abuse. He has no active signs or symptoms of bleeding   Suicidal ideation- He has had no active suicidal gestures at this time. Psychology consulted and we appreciate their recommendations, he was started on Fluoxetine 20mg  qd for depression and Trazodonoe 50 mcg qHS for insomniaand recommended for placement at a rehabilitation facility.   Dispo: Anticipated discharge in approximately 1-2 day(s).   LOS: 6 days   Antonio Noss, MD 02/19/2016, 8:10 AM Pager: [phone]

## 2016-02-19 NOTE — Evaluation (Signed)
Physical Therapy Evaluation Patient Details Name: Antonio [name] MRN: [id number] DOB: [birth year] Today's Date: 02/19/2016   History of Present Illness  pt is a 45 y/o with h/o polysubstance abuse incl alcohol, narcotics including cocaine, admitted with episode of syncope starting with lower chest /epigastric abdominal pain, vomiting, diarrhea and dizziness.  He was unconscious for an indeterminate amount of time the woke up and was able to make in back to a friend's home to call an ambulance.  Clinical Impression  Pt admitted with/for s/s leading to dx of gastritis and pancreatitis.  Pt currently limited functionally due to the problems listed. ( See problems l)  But should improve without PT, though may need some supervision during mobility until his tremors and weakness subside.  Will sign off at this time in anticipation of d/c tomorrow to substance abuse center.     Follow Up Recommendations No PT follow up;Other (comment) (going to substance abuse rehab, expect him to improve sign)    Equipment Recommendations  None recommended by PT    Recommendations for Other Services       Precautions / Restrictions Precautions Precautions: Fall      Mobility  Bed Mobility Overal bed mobility: Modified Independent                Transfers Overall transfer level: Needs assistance   Transfers: Sit to/from Stand Sit to Stand: Supervision            Ambulation/Gait Ambulation/Gait assistance: Min assist Ambulation Distance (Feet): 300 Feet Assistive device: None Gait Pattern/deviations: Step-through pattern   Gait velocity interpretation: Below normal speed for age/gender General Gait Details: tremulous/shaky gait leading to unsteadiness.  Min assist helpful to steady paitient.  Changes in speed don't seem to have positive or negative affects on his stability.  Stairs            Wheelchair Mobility    Modified Rankin (Stroke Patients Only)       Balance  Overall balance assessment: Needs assistance   Sitting balance-Leahy Scale: Good     Standing balance support: No upper extremity supported;Single extremity supported Standing balance-Leahy Scale: Fair                               Pertinent Vitals/Pain Pain Assessment: No/denies pain    Home Living Family/patient expects to be discharged to:: Other (Comment) (Alcohol/substance abuse rehab center)   Available Help at Discharge: Family;Friend(s)             Additional Comments: He stated he knew he didn't need to be around any of his usual friends due to was sure he would drink/etc again.    Prior Function Level of Independence: Independent               Hand Dominance        Extremity/Trunk Assessment               Lower Extremity Assessment: Generalized weakness (tremulous bil with associated weakness)         Communication   Communication: No difficulties  Cognition Arousal/Alertness: Awake/alert Behavior During Therapy: WFL for tasks assessed/performed Overall Cognitive Status: Within Functional Limits for tasks assessed                      General Comments      Exercises        Assessment/Plan    PT Assessment All further  PT needs can be met in the next venue of care;Patent does not need any further PT services (if pt does not improve soon)  PT Diagnosis Abnormality of gait;Difficulty walking   PT Problem List Decreased strength;Decreased coordination;Decreased mobility  PT Treatment Interventions     PT Goals (Current goals can be found in the Care Plan section) Acute Rehab PT Goals Patient Stated Goal: Stop drinking and using PT Goal Formulation: With patient    Frequency     Barriers to discharge        Co-evaluation               End of Session   Activity Tolerance: Patient tolerated treatment well;Patient limited by fatigue Patient left: in bed;with call bell/phone within reach;with bed alarm  set Nurse Communication: Mobility status         Time: 3354-5625 PT Time Calculation (min) (ACUTE ONLY): 20 min   Charges:   PT Evaluation $PT Eval Low Complexity: 1 Procedure     PT G Codes:        Antonio [name], Antonio [name] 02/19/2016, 5:23 PM 02/19/2016  Antonio [name], [place] [phone]  (pager)

## 2016-02-20 ENCOUNTER — Encounter: Payer: Self-pay | Admitting: Internal Medicine

## 2016-02-20 DIAGNOSIS — B1920 Unspecified viral hepatitis C without hepatic coma: Secondary | ICD-10-CM

## 2016-02-20 MED ORDER — TRAZODONE HCL 50 MG PO TABS: 50.0000 mg | ORAL_TABLET | Freq: Every day | ORAL | 0 refills | Status: DC

## 2016-02-20 MED ORDER — SUCRALFATE 1 G PO TABS: 1.0000 g | ORAL_TABLET | Freq: Three times a day (TID) | ORAL | 0 refills | Status: DC

## 2016-02-20 MED ORDER — FAMOTIDINE 20 MG PO TABS: 20.0000 mg | ORAL_TABLET | Freq: Two times a day (BID) | ORAL | 0 refills | Status: DC

## 2016-02-20 MED ORDER — FLUOXETINE HCL 20 MG PO CAPS: 20.0000 mg | ORAL_CAPSULE | Freq: Every day | ORAL | 0 refills | Status: DC

## 2016-02-20 NOTE — Discharge Summary (Signed)
Name: Antonio [name] MRN: [id number] DOB: 08/25/1970 45 y.o. PCP: No primary care provider on file.  Date of Admission: 02/12/2016  8:55 AM Date of Discharge: 02/20/2016 Attending Physician: No att. providers found  Discharge Diagnosis: 1. Alcoholic gastritis vs pancreatitis  2. Polysubstance abuse and alcohol withdrawal  3. Hepatitis C   viral load 388,000 IU/ml   Outpatient treatment options need to be addressed at follow up  4. Polysubstance abuse  Psych consulted and recommended fluoxetine and trazodone   Principal Problem:   Alcoholic gastritis Active Problems:   Polysubstance abuse   Alcohol withdrawal (Antonio [name])   History of splenectomy   Thrombocytopenia (HCC)   Hepatitis C   Spleen absent   Syncope   Chest pain   Pancreatitis, acute   Discharge Medications:   Medication List    STOP taking these medications   aspirin 81 MG tablet   HYDROcodone-acetaminophen 10-325 MG tablet Commonly known as:  NORCO   naproxen 500 MG tablet Commonly known as:  NAPROSYN   traMADol 50 MG tablet Commonly known as:  ULTRAM     TAKE these medications   famotidine 20 MG tablet Commonly known as:  PEPCID Take 1 tablet (20 mg total) by mouth 2 (two) times daily.   FLUoxetine 20 MG capsule Commonly known as:  PROZAC Take 1 capsule (20 mg total) by mouth daily.   sucralfate 1 g tablet Commonly known as:  CARAFATE Take 1 tablet (1 g total) by mouth 4 (four) times daily -  with meals and at bedtime.   traZODone 50 MG tablet Commonly known as:  DESYREL Take 1 tablet (50 mg total) by mouth at bedtime.       Disposition and follow-up:   Mr.Antonio [name] was discharged from [facility] in Good condition.  At the hospital follow up visit please address:  1. Alcoholic gastritis- Have symptoms improved? Polysubstance abuse- How has he been doing since discharge from the rehabilitation facility? Please evaluate which long term options are available for  sustained rehabilitation. Alcoholics anonymous?  Hepatitis C viral load 388,000 IU/ml - Please address treatment options   2.  Labs / imaging needed at time of follow-up: CMET, CBC w platelets  3.  Pending labs/ test needing follow-up: none   Follow-up Appointments: Follow-up Information    [place]. Call today.   Why:  Call [phone] and make an appointment to see a doctor when you are out of the rehabilitation program  Contact information: [place] [phone] [place] [facility] Course by problem list: Principal Problem:   Alcoholic gastritis Active Problems:   Polysubstance abuse   Alcohol withdrawal ([place])   History of splenectomy   Thrombocytopenia ([place])   Hepatitis C   Spleen absent   Syncope   Chest pain   Pancreatitis, acute   Gastritis and Pancreatitis  Patient with a past medical history of s/p splenectomy as a result of traumatic injury, Hepatitis C, and polysubstance abuse presented with epigastric abdominal pain, nausea, vomiting, and generalized weakness which had resulted in one episode of vasovagal syncope.  In the ED he was found to have lipase 78, anion gap 12, platelet count 45, AST 326, ALT 120,  FOBT+, CT abdomen showed a diffuse fatty liver but no acute intraabdominal findings, and abdominal xray showed normal bowel gas pattern. Troponin trended 0.0 >> <0.03.  Bowel rest,  IV fluids, famotidine, and  carafate led to resolution of his abdominal tenderness. On the day of discharge he had stable vital signs and no abdominal tenderness, he was ambulating with physical therapy, and he was tolerating regular diet without vomiting or abdominal pain. He was discharged on Famotidine 20 mg BID and carafate for meals and bedtime.    Polysubstance abuse  The patient stated that he has been drinking since he was a teenager and currently drinks a 12 pack of beer per day. In the ED blood  alcohol level was 42, Urine drug screen was positive for opiates and cocaine.  He was monitored by CIWA for the duration of this admission. Initially scores were elevated and he received about 2 doses of ativan per day, eventually scores fell to zero.   Psychology was consulted for passive suicidal ideations and recommended Trazodone and Fluoxetine prescriptions. I received a call the afternoon of discharge that [name] had decided not to take these medications outpatient. I believe these medications would improve his mood and sleep however if he would not like to take them it shouldn't have an impact on him from a medical perspective.  He was very persistent in his motivation to get to a drug rehabilitation program and was discharged the morning of his intake appointment with plans to get a ride with a friend to go straight to that appointment.   Fatty liver  On presentation he was found to have a liver edge palpable 5 cm below the rib margin and spider angiomas and CT abdomen revealed fatty liver. This finding is most likely secondary to his chronic alcohol use. Low platelets and elevated transaminase trended towards normal for the duration of his hospital stay, these findings were most likely related to his chronic liver disease and polysubstance abuse.   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 He was monitored for signs of infection and remained afebrile for the duration of his hospital admission.   Discharge Vitals:   BP 128/84 (BP Location: Right Arm)   Pulse 63   Temp 98.6 F (37 C) (Oral)   Resp 18   Ht 5\' 2"  (1.575 m)   Wt 141 lb (64 kg)   SpO2 99%   BMI 25.79 kg/m   Pertinent Labs, Studies, and Procedures: Hepatitis B surface Ag 8/22 negative  Hepatitis B E antigen 8/22 negative  Hep C viral load 388,000 IU/ml   Procedures Performed:  Ct Abdomen Wo Contrast  Result Date: 02/12/2016 CLINICAL DATA:  Abdominal pain and  dizziness. EXAM: CT ABDOMEN WITHOUT CONTRAST TECHNIQUE: Multidetector CT imaging of the abdomen was performed following the standard protocol without IV contrast. COMPARISON:  06/06/2010 FINDINGS: Lower chest: Stable small partially calcified granuloma in the left lower lobe. No acute pulmonary findings or worrisome pulmonary lesions. The heart is normal in size. No pericardial effusion. The distal esophagus is grossly normal. Hepatobiliary: Diffuse and marked fatty infiltration of the liver but no focal hepatic lesions or biliary dilatation. The gallbladder is contracted. No common bile duct dilatation. Pancreas: No mass, inflammation or ductal dilatation. Spleen: Status post splenectomy. Adrenals/Urinary Tract: The adrenal glands and kidneys are unremarkable. No renal, ureteral or bladder calculi or mass. Stomach/Bowel: The stomach, duodenum, small bowel and colon are grossly normal without oral contrast. No acute inflammatory changes, mass lesions or obstructive findings. Vascular/Lymphatic: The aorta is normal in caliber. No atherosclerotic calcifications. Small scattered mesenteric and retroperitoneal lymph nodes but no mass or overt adenopathy. Other: The prostate gland and seminal  vesicles are unremarkable. No pelvic mass or adenopathy. No free pelvic fluid collections. No inguinal mass or adenopathy. No abdominal wall hernia or subcutaneous lesions. Musculoskeletal: No significant bony findings. IMPRESSION: No acute abdominal/ pelvic findings, mass lesions or adenopathy on this noncontrast examination. Diffuse and marked fatty infiltration of the liver. Status post splenectomy. Electronically Signed   By: Marijo Sanes M.D.   On: 02/12/2016 15:58   Dg Chest 2 View  Result Date: 02/12/2016 CLINICAL DATA:  Central lower chest pain, epigastric pain radiating to back. EXAM: CHEST  2 VIEW COMPARISON:  07/21/2010 FINDINGS: Heart is upper limits normal in size. Lungs are clear. No effusions or edema. No acute  bony abnormality. IMPRESSION: No active cardiopulmonary disease. Electronically Signed   By: Rolm Baptise M.D.   On: 02/12/2016 10:28   Dg Wrist Complete Right  Result Date: 01/29/2016 CLINICAL DATA:  Pain after trauma EXAM: RIGHT WRIST - COMPLETE 3+ VIEW COMPARISON:  None. FINDINGS: There is a nondisplaced fracture through the distal radius extending into the radiocarpal joint. IMPRESSION: Nondisplaced fracture through the distal radius. The fracture extends into the radiocarpal joint. Electronically Signed   By: Dorise Bullion III M.D   On: 01/29/2016 20:42   Dg Abd 2 Views  Result Date: 02/17/2016 CLINICAL DATA:  Lower abdominal pain for 8 days.  Vomiting. EXAM: ABDOMEN - 2 VIEW COMPARISON:  CT 02/12/2016 FINDINGS: Surgical clips in the left upper quadrant from prior splenectomy. Nonobstructive bowel gas pattern. No free air organomegaly. No suspicious calcification. Visualized lung bases clear. IMPRESSION: No acute findings. Electronically Signed   By: Rolm Baptise M.D.   On: 02/17/2016 08:15   Dg Hand Complete Right  Result Date: 01/29/2016 CLINICAL DATA:  Pain after fall EXAM: RIGHT HAND - COMPLETE 3+ VIEW COMPARISON:  None. FINDINGS: There is no evidence of fracture or dislocation. There is no evidence of arthropathy or other focal bone abnormality. Soft tissues are unremarkable. IMPRESSION: Negative. Electronically Signed   By: Dorise Bullion III M.D   On: 01/29/2016 20:40   Consultations: Treatment Team:  Ambrose Finland, MD  Discharge Instructions: Discharge Instructions    Call MD for:  extreme fatigue    Complete by:  As directed   Call MD for:  persistant nausea and vomiting    Complete by:  As directed   Call MD for:  severe uncontrolled pain    Complete by:  As directed   Diet - low sodium heart healthy    Complete by:  As directed   Increase activity slowly    Complete by:  As directed      Signed: Ledell Noss, MD 02/20/2016, 7:51 AM   Pager: [phone]

## 2016-02-20 NOTE — Progress Notes (Signed)
Pt discharged from hospital at 6:30AM to go to rehab. Vital signs stable and left forearm IV removed. RN gave and educated patient about prescriptions and future appointments after discharge. Patient stable at discharge.

## 2016-02-20 NOTE — Progress Notes (Signed)
This morning at 6:30am [name] was discharged with a plan to ride directly to his rehabilitation intake interview. I received a call this morning from our social work team with information that he had appeared at this appointment with his discharge prescriptions for famotidine,sucralfate, trazodone, and fluoxetine and told the facility that he was not willing to take these prescriptions. At 12 pm I received a call that the patient had presented back to the floor where he was cared for and was requesting a letter stating that these medications needed to be discontinued. I went to the floor to see him, he stated that his abdominal pain has improved and insisted that these medications were making his pain worse. He states that he has another appointment at [facility] for tomorrow morning and they have guaranteed that he still has a bed there. I provided him with a note giving permission to discontinue these medications and also voicing my concern that ideally it would be best to continue these medications for the continuity of his medical treatment. He was hospitalized for gastritis and I worry that his non compliance with the famotidine and sucralfate may exacerbate his abdominal pain symptoms.

## 2016-02-20 NOTE — Discharge Instructions (Signed)
Thank you for trusting [place] with your medical care!  You were hospitalized for gastritis and pancreatitis and treated with fluids and medications (protonix).   Please take note of the following changes to your medications: Start taking: Famotidine 20mg  twice daily Carafate with meals and at bedtime  Trazodone at bedtime (do not drive or operate machinery after taking this medication)  Fluoxetine once daily    To make sure you are getting better, please make it to the follow-up appointments listed on the first page.  If you have any questions, please call [phone].

## 2016-02-22 ENCOUNTER — Emergency Department (HOSPITAL_COMMUNITY): Payer: Self-pay

## 2016-02-22 ENCOUNTER — Encounter (HOSPITAL_COMMUNITY): Payer: Self-pay | Admitting: *Deleted

## 2016-02-22 ENCOUNTER — Emergency Department (HOSPITAL_COMMUNITY)
Admission: EM | Admit: 2016-02-22 | Discharge: 2016-02-22 | Disposition: A | Discharge status: Home / Self Care | Payer: Self-pay | Attending: Emergency Medicine | Admitting: Emergency Medicine

## 2016-02-22 DIAGNOSIS — F17210 Nicotine dependence, cigarettes, uncomplicated: Secondary | ICD-10-CM | POA: Insufficient documentation

## 2016-02-22 DIAGNOSIS — Z79899 Other long term (current) drug therapy: Secondary | ICD-10-CM | POA: Insufficient documentation

## 2016-02-22 DIAGNOSIS — X58XXXD Exposure to other specified factors, subsequent encounter: Secondary | ICD-10-CM | POA: Insufficient documentation

## 2016-02-22 DIAGNOSIS — S52501D Unspecified fracture of the lower end of right radius, subsequent encounter for closed fracture with routine healing: Secondary | ICD-10-CM | POA: Insufficient documentation

## 2016-02-22 DIAGNOSIS — Z09 Encounter for follow-up examination after completed treatment for conditions other than malignant neoplasm: Secondary | ICD-10-CM | POA: Insufficient documentation

## 2016-02-22 NOTE — ED Notes (Signed)
Ortho tech at bedside with cast remover.

## 2016-02-22 NOTE — ED Provider Notes (Signed)
[facility] DEPT Provider Note   CSN: [id number] Arrival date & time: 02/22/16  1045     History   Chief Complaint Chief Complaint  Patient presents with  . Cast Removal    HPI Antonio [name] is a 45 y.o. male.  HPI   Pt with hx alcoholism, chronic pain, polysubstance abuse, Hep C with right nondisplaced distal radius fracture with extension into joint sustained 01/29/16.  Seen by Hand Surgery Dr Tish Men and placed in cast, plan for conservative management, rexray in two weeks, Futuro wrist splint given for approximately 4 weeks ago.  Pt states he wants his cast off.  He has no pain any longer, is using the arm and hand, lifting things with it, not having any problems.  Denies weakness or numbness of the hand.  He is left handed.    Past Medical History:  Diagnosis Date  . Alcoholic ([place])   . Chronic pain syndrome   . Complication of anesthesia    hard to wake up  . Depression   . Hepatitis    hep b and c  . Pain in thoracic spine   . Ruptured spleen     Patient Active Problem List   Diagnosis Date Noted  . Pancreatitis, acute 02/16/2016  . Chest pain 02/13/2016  . Alcoholic gastritis [id number]  . 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 Syncope 02/12/2016  . Opioid dependence ([place]) 10/06/2012  . Unspecified episodic mood disorder 10/06/2012  . Alcohol dependence ([place]) 10/06/2012  . Polysubstance abuse 02/24/2012  . Chronic back pain 02/24/2012  . Withdrawal from opioids ([place]) 02/24/2012  . Alcohol withdrawal ([place]) 02/24/2012  . 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 Thrombocytopenia ([place]) 02/24/2012  . Neutropenia- low relative neutrophil count 02/24/2012  . Dehydration with hypernatremia 02/24/2012  . Elevated CPK 02/24/2012  . Metabolic encephalopathy 2/2 alcohol and opiods/dehydration 02/24/2012  . Depression 02/24/2012  . Hepatitis B 02/24/2012  . Hepatitis C 02/24/2012  . Alcohol abuse, daily use 06/02/2011    Past Surgical History:  Procedure  Laterality Date  . NO PAST SURGERIES     spleen removed 5 yrs ago  . ORIF ANKLE FRACTURE Left 10/06/2013   Procedure: LEFT ANKLE FRACTURE OPEN TREATMENT BILMALLEOLAR ANKLE INCLUDES INTERNAL FIXATION, LEFT ANKLE FRACTURE OPEN TREATMENT DISTAL TIBIOFIBULAR INCLUDES INTERNAL FIXATION ;  Surgeon: Renette Butters, MD;  Location: [facility];  Service: Orthopedics;  Laterality: Left;  . spllenectomy         Home Medications    Prior to Admission medications   Medication Sig Start Date End Date Taking? Authorizing Provider  famotidine (PEPCID) 20 MG tablet Take 1 tablet (20 mg total) by mouth 2 (two) times daily. 02/20/16   Asencion Partridge, MD  FLUoxetine (PROZAC) 20 MG capsule Take 1 capsule (20 mg total) by mouth daily. 02/20/16   Asencion Partridge, MD  sucralfate (CARAFATE) 1 g tablet Take 1 tablet (1 g total) by mouth 4 (four) times daily -  with meals and at bedtime. 02/20/16   Asencion Partridge, MD  traZODone (DESYREL) 50 MG tablet Take 1 tablet (50 mg total) by mouth at bedtime. 02/20/16   Asencion Partridge, MD    Family History No family history on file.  Social History Social History  Substance Use Topics  . Smoking status: Current Every Day Smoker    Packs/day: 1.00    Years: 15.00    Types: Cigarettes  . Smokeless tobacco: Never Used  . Alcohol use 50.4 oz/week  29 Cans of beer per week     Allergies   Fish allergy; Sulfa antibiotics; and Bee venom   Review of Systems Review of Systems  Constitutional: Negative for chills and fever.  Musculoskeletal: Negative for arthralgias.  Skin: Negative for color change.  Neurological: Negative for weakness and numbness.     Physical Exam Updated Vital Signs BP 111/85 (BP Location: Left Arm)   Pulse 98   Temp 99.7 F (37.6 C) (Oral)   Resp 16   Ht 5\' 3"  (1.6 m)   Wt 62.6 kg   SpO2 100%   BMI 24.45 kg/m   Physical Exam  Constitutional: He appears well-developed and well-nourished. No distress.  HENT:  Head:  Normocephalic and atraumatic.  Neck: Neck supple.  Pulmonary/Chest: Effort normal.  Musculoskeletal:  Right wrist with cast in place. Moves all fingers evenly, no edema, no erythema.  Cast removed:  No break in skin.  No tenderness throughout examination of right arm.  Pulses intact, capillary refill < 3 seconds.    Neurological: He is alert.  Skin: He is not diaphoretic.  Nursing note and vitals reviewed.    ED Treatments / Results  Labs (all labs ordered are listed, but only abnormal results are displayed) Labs Reviewed - No data to display  EKG  EKG Interpretation None       Radiology Dg Wrist Complete Right  Result Date: 02/22/2016 CLINICAL DATA:  Follow-up fracture EXAM: RIGHT WRIST - COMPLETE 3+ VIEW COMPARISON:  01/29/2016 FINDINGS: The intra-articular fracture is less distinct. Alignment remains anatomic. Sclerotic band across the radial aspect of the radius is noted, indicating healing. No new fracture. IMPRESSION: Interval healing of the intra-articular distal radius fracture. Stable alignment which is anatomic. Electronically Signed   By: Marybelle Killings M.D.   On: 02/22/2016 12:22    Procedures Procedures (including critical care time)  Medications Ordered in ED Medications - No data to display   Initial Impression / Assessment and Plan / ED Course  I have reviewed the triage vital signs and the nursing notes.  Pertinent labs & imaging results that were available during my care of the patient were reviewed by me and considered in my medical decision making (see chart for details).  Clinical Course    Afebrile, nontoxic patient with injury to his right wrist on 01/29/16 resutling in nondisplaced distal radius fracture with extension into joint.  Has has not follow up with hand surgery (Dr Amedeo Plenty) as recommended.   Xray demonstrates improvement.    Pt insistent on having the cast removed and no cast or splint placed again.  He agrees to velcro splint.  Pt strongly  advised to use the splint, not return to full use of the arm until instructed by hand surgery, and to follow up with hand as previously directed.  Pt verbalizes understanding, agrees with plan.  D/C home with velcro wrist splint.  Discussed result, findings, treatment, and follow up  with patient.  Pt given return precautions.  Pt verbalizes understanding and agrees with plan.      Final Clinical Impressions(s) / ED Diagnoses   Final diagnoses:  Fracture follow-up    New Prescriptions Discharge Medication List as of 02/22/2016 12:49 PM       Clayton Bibles, PA-C 02/22/16 [place], MD 03/04/16 [id number]

## 2016-02-22 NOTE — ED Notes (Signed)
Ortho tech made aware 

## 2016-02-22 NOTE — ED Triage Notes (Signed)
Pt requesting to have his hard cast removed.  Denies any complaints at this time.

## 2016-02-22 NOTE — Discharge Instructions (Signed)
Read the information below.  You may return to the Emergency Department at any time for worsening condition or any new symptoms that concern you.  If you develop uncontrolled pain, weakness or numbness of the extremity, severe discoloration of the skin, or you are unable to move your wrist or hand, return to the ER for a recheck.

## 2016-02-22 NOTE — ED Notes (Signed)
PA at bedside.

## 2016-11-13 ENCOUNTER — Encounter (HOSPITAL_COMMUNITY): Payer: Self-pay

## 2016-11-13 ENCOUNTER — Emergency Department (HOSPITAL_COMMUNITY)
Admission: EM | Admit: 2016-11-13 | Discharge: 2016-11-13 | Disposition: A | Discharge status: Home / Self Care | Payer: Self-pay | Attending: Emergency Medicine | Admitting: Emergency Medicine

## 2016-11-13 ENCOUNTER — Emergency Department (HOSPITAL_COMMUNITY): Payer: Self-pay

## 2016-11-13 DIAGNOSIS — Y9389 Activity, other specified: Secondary | ICD-10-CM | POA: Insufficient documentation

## 2016-11-13 DIAGNOSIS — S99912A Unspecified injury of left ankle, initial encounter: Secondary | ICD-10-CM | POA: Insufficient documentation

## 2016-11-13 DIAGNOSIS — Y990 Civilian activity done for income or pay: Secondary | ICD-10-CM | POA: Insufficient documentation

## 2016-11-13 DIAGNOSIS — F17210 Nicotine dependence, cigarettes, uncomplicated: Secondary | ICD-10-CM | POA: Insufficient documentation

## 2016-11-13 DIAGNOSIS — Y9289 Other specified places as the place of occurrence of the external cause: Secondary | ICD-10-CM | POA: Insufficient documentation

## 2016-11-13 DIAGNOSIS — M25572 Pain in left ankle and joints of left foot: Secondary | ICD-10-CM

## 2016-11-13 DIAGNOSIS — W230XXA Caught, crushed, jammed, or pinched between moving objects, initial encounter: Secondary | ICD-10-CM | POA: Insufficient documentation

## 2016-11-13 NOTE — ED Notes (Signed)
Pt presents with L foot pain after hitting a metal door at work.  Injury happened on Tuesday.  Pt asking for crackers.

## 2016-11-13 NOTE — ED Provider Notes (Signed)
[facility] DEPT Provider Note   CSN: [id number] Arrival date & time: 11/13/16  1950  By signing my name below, I, Marcello Moores, attest that this documentation has been prepared under the direction and in the presence of Etta Quill, NP  Electronically Signed: Marcello Moores, ED Scribe. 11/13/16. 10:14 PM.  History   Chief Complaint Chief Complaint  Patient presents with  . Foot Injury   The history is provided by the patient. No language interpreter was used.  Foot Injury   The incident occurred 2 days ago. The incident occurred at work. The injury mechanism was a direct blow. The pain is present in the left foot and left ankle. The pain is moderate. Pertinent negatives include no numbness. The symptoms are aggravated by bearing weight. He has tried heat for the symptoms. The treatment provided no relief.   HPI Comments: Antonio [name] is a 46 y.o. male who presents to the Emergency Department complaining of constant, moderate gradually worsening left ankle pain s/p a 200lb metal door striking his left foot and ankle 2 days ago. He reports that he could ambulate with difficulty yesterday, but his pain increased this morning. Pt has associated swelling to the left ankle. Ambulation exacerbates his pain. He has tried hot compresses with inadequate relief. The pt has a h/x of hepatitis B and C and his SHx includes alcoholism. He denies numbness any additional injuries at this time.    Past Medical History:  Diagnosis Date  . Alcoholic ([place])   . Chronic pain syndrome   . Complication of anesthesia    hard to wake up  . Depression   . Hepatitis    hep b and c  . Pain in thoracic spine   . Ruptured spleen     Patient Active Problem List   Diagnosis Date Noted  . Pancreatitis, acute 02/16/2016  . Chest pain 02/13/2016  . Alcoholic gastritis 49/67/5916  . 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 Syncope 02/12/2016  . Opioid dependence ([place]) 10/06/2012  . Unspecified episodic mood  disorder 10/06/2012  . Alcohol dependence ([place]) 10/06/2012  . Polysubstance abuse 02/24/2012  . Chronic back pain 02/24/2012  . Withdrawal from opioids ([place]) 02/24/2012  . Alcohol withdrawal ([place]) 02/24/2012  . 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 Thrombocytopenia ([place]) 02/24/2012  . Neutropenia- low relative neutrophil count 02/24/2012  . Dehydration with hypernatremia 02/24/2012  . Elevated CPK 02/24/2012  . Metabolic encephalopathy 2/2 alcohol and opiods/dehydration 02/24/2012  . Depression 02/24/2012  . Hepatitis B 02/24/2012  . Hepatitis C 02/24/2012  . Alcohol abuse, daily use 06/02/2011    Past Surgical History:  Procedure Laterality Date  . NO PAST SURGERIES     spleen removed 5 yrs ago  . ORIF ANKLE FRACTURE Left 10/06/2013   Procedure: LEFT ANKLE FRACTURE OPEN TREATMENT BILMALLEOLAR ANKLE INCLUDES INTERNAL FIXATION, LEFT ANKLE FRACTURE OPEN TREATMENT DISTAL TIBIOFIBULAR INCLUDES INTERNAL FIXATION ;  Surgeon: Renette Butters, MD;  Location: [facility];  Service: Orthopedics;  Laterality: Left;  . spllenectomy         Home Medications    Prior to Admission medications   Medication Sig Start Date End Date Taking? Authorizing Provider  famotidine (PEPCID) 20 MG tablet Take 1 tablet (20 mg total) by mouth 2 (two) times daily. 02/20/16   Asencion Partridge, MD  FLUoxetine (PROZAC) 20 MG capsule Take 1 capsule (20 mg total) by mouth daily. 02/20/16   Asencion Partridge, MD  sucralfate (CARAFATE) 1 g tablet Take 1 tablet (1  g total) by mouth 4 (four) times daily -  with meals and at bedtime. 02/20/16   Asencion Partridge, MD  traZODone (DESYREL) 50 MG tablet Take 1 tablet (50 mg total) by mouth at bedtime. 02/20/16   Asencion Partridge, MD    Family History No family history on file.  Social History Social History  Substance Use Topics  . Smoking status: Current Every Day Smoker    Packs/day: 1.00    Years: 15.00    Types: Cigarettes  . Smokeless tobacco: Never Used  .  Alcohol use 50.4 oz/week    84 Cans of beer per week     Allergies   Fish allergy; Sulfa antibiotics; and Bee venom   Review of Systems Review of Systems  Musculoskeletal: Positive for arthralgias, joint swelling and myalgias. Negative for gait problem.  Neurological: Negative for numbness.  All other systems reviewed and are negative.    Physical Exam Updated Vital Signs BP (!) 134/93 (BP Location: Left Arm)   Pulse 70   Temp 97.9 F (36.6 C) (Oral)   Resp 18   SpO2 97%   Physical Exam  Constitutional: He is oriented to person, place, and time. He appears well-developed and well-nourished. No distress.  HENT:  Head: Normocephalic and atraumatic.  Eyes: EOM are normal. Pupils are equal, round, and reactive to light.  Neck: Normal range of motion.  Cardiovascular: Normal rate, regular rhythm, normal heart sounds and intact distal pulses.  Exam reveals no gallop and no friction rub.   No murmur heard. Pulmonary/Chest: Effort normal. No respiratory distress. He has no wheezes. He has no rales.  Abdominal: Soft. Bowel sounds are normal.  Musculoskeletal: He exhibits edema and tenderness.  Swelling to medial aspect of left ankle Distal pulses and sensation are intact  Neurological: He is alert and oriented to person, place, and time.  Skin: Skin is warm and dry.  Psychiatric: He has a normal mood and affect.     ED Treatments / Results   DIAGNOSTIC STUDIES: Oxygen Saturation is 97% on RA, adequate by my interpretation.   COORDINATION OF CARE: 9:54 PM-Discussed next steps with pt including icing it for 30 minutes a day. Pt verbalized understanding and is agreeable with the plan.   Labs (all labs ordered are listed, but only abnormal results are displayed) Labs Reviewed - No data to display  EKG  EKG Interpretation None       Radiology Dg Ankle Complete Left  Result Date: 11/13/2016 CLINICAL DATA:  Injury with swelling EXAM: LEFT ANKLE COMPLETE - 3+ VIEW  COMPARISON:  06/20/2014 FINDINGS: Large amount of medial soft tissue swelling. No definite acute displaced fracture. Accessory os or old fracture medial malleolus similar compared to prior. Screw fixation of the medial malleolus. Metallic anchors along the cortex of the fibula and tibia are unchanged. The ankle mortise is symmetric. IMPRESSION: Large amount of medial soft tissue swelling with postsurgical changes of the distal tibia and fibula. No definite acute osseous abnormality. Electronically Signed   By: Donavan Foil M.D.   On: 11/13/2016 21:40   Dg Foot Complete Left  Result Date: 11/13/2016 CLINICAL DATA:  Injury to the left foot EXAM: LEFT FOOT - COMPLETE 3+ VIEW COMPARISON:  None. FINDINGS: Old fracture deformity of the second proximal phalanx. No acute displaced fracture or malalignment. No radiopaque foreign body in the soft tissues. Partially visualized fixating screws in the distal tibia. IMPRESSION: No definite acute osseous abnormality. Old fracture deformity of the second proximal phalanx Electronically  Signed   By: Donavan Foil M.D.   On: 11/13/2016 21:37    Procedures Procedures (including critical care time)  Medications Ordered in ED Medications - No data to display   Initial Impression / Assessment and Plan / ED Course  I have reviewed the triage vital signs and the nursing notes.  Pertinent labs & imaging results that were available during my care of the patient were reviewed by me and considered in my medical decision making (see chart for details).     Patient X-Ray negative for obvious fracture or dislocation.  Pt advised to follow up with his orthopedist . Patient given ASO splint and crutches while in ED, conservative therapy recommended and discussed. Patient will be discharged home & is agreeable with above plan. Returns precautions discussed. Pt appears safe for discharge.  Final Clinical Impressions(s) / ED Diagnoses   Final diagnoses:  Acute left ankle  pain    New Prescriptions New Prescriptions   No medications on file   I personally performed the services described in this documentation, which was scribed in my presence. The recorded information has been reviewed and is accurate.      Etta Quill, NP 11/13/16 [id number]    Carmin Muskrat, MD 11/14/16 [phone]

## 2016-11-13 NOTE — ED Notes (Signed)
Ortho aware of pt's need for splint and crutches.  Pt informed that he needed to wait for ortho to come and apply splint after asking if he could do it himself and just leave.  Pt given sprite, graham crackers, and peanut butter.

## 2016-11-13 NOTE — ED Notes (Signed)
+   pedal pulse using doppler

## 2016-11-13 NOTE — ED Triage Notes (Signed)
Pt reports a "200 lb metal door" hit his left foot. Significant swelling to left medial ankle. This occurred Tuesday. Cap refill <3 seconds.

## 2016-11-13 NOTE — Progress Notes (Signed)
Orthopedic Tech Progress Note Patient Details:  Antonio [name] 1970/07/04 [id number]  Ortho Devices Type of Ortho Device: ASO, Crutches Ortho Device/Splint Location: LLE Ortho Device/Splint Interventions: Ordered, Application   [name] 11/13/2016, 10:32 PM

## 2016-11-13 NOTE — ED Notes (Signed)
Pt transported to xray at this time

## 2016-11-21 DIAGNOSIS — M009 Pyogenic arthritis, unspecified: Secondary | ICD-10-CM

## 2016-11-21 HISTORY — DX: Pyogenic arthritis, unspecified: M00.9

## 2016-11-26 ENCOUNTER — Encounter (HOSPITAL_COMMUNITY): Payer: Self-pay | Admitting: Emergency Medicine

## 2016-11-26 ENCOUNTER — Emergency Department (HOSPITAL_COMMUNITY)
Admission: EM | Admit: 2016-11-26 | Discharge: 2016-11-26 | Disposition: A | Discharge status: Home / Self Care | Payer: Self-pay | Attending: Emergency Medicine | Admitting: Emergency Medicine

## 2016-11-26 DIAGNOSIS — Y939 Activity, unspecified: Secondary | ICD-10-CM | POA: Insufficient documentation

## 2016-11-26 DIAGNOSIS — R2242 Localized swelling, mass and lump, left lower limb: Secondary | ICD-10-CM | POA: Insufficient documentation

## 2016-11-26 DIAGNOSIS — X58XXXA Exposure to other specified factors, initial encounter: Secondary | ICD-10-CM | POA: Insufficient documentation

## 2016-11-26 DIAGNOSIS — Z5321 Procedure and treatment not carried out due to patient leaving prior to being seen by health care provider: Secondary | ICD-10-CM | POA: Insufficient documentation

## 2016-11-26 DIAGNOSIS — S99922A Unspecified injury of left foot, initial encounter: Secondary | ICD-10-CM | POA: Insufficient documentation

## 2016-11-26 DIAGNOSIS — Y929 Unspecified place or not applicable: Secondary | ICD-10-CM | POA: Insufficient documentation

## 2016-11-26 DIAGNOSIS — Y999 Unspecified external cause status: Secondary | ICD-10-CM | POA: Insufficient documentation

## 2016-11-26 NOTE — ED Triage Notes (Addendum)
Pt with L foot injury several weeks ago. Pt seen in [facility] and XR negative. Pt reports that he had injury several months ago and had surgery with screws placed. Pt states pain and edema is worsening with recent injury, pt states foot is throbbing and warm to touch. Pulses present. Foot color WNL, with redness at ankle and prior incision.

## 2016-11-27 HISTORY — PX: INCISION AND DRAINAGE: SHX5863

## 2018-09-12 ENCOUNTER — Emergency Department (HOSPITAL_COMMUNITY): Payer: Self-pay

## 2018-09-12 ENCOUNTER — Emergency Department (HOSPITAL_COMMUNITY)
Admission: EM | Admit: 2018-09-12 | Discharge: 2018-09-12 | Disposition: A | Discharge status: Home / Self Care | Payer: Self-pay | Attending: Emergency Medicine | Admitting: Emergency Medicine

## 2018-09-12 DIAGNOSIS — F17210 Nicotine dependence, cigarettes, uncomplicated: Secondary | ICD-10-CM | POA: Insufficient documentation

## 2018-09-12 DIAGNOSIS — S82844A Nondisplaced bimalleolar fracture of right lower leg, initial encounter for closed fracture: Secondary | ICD-10-CM | POA: Insufficient documentation

## 2018-09-12 DIAGNOSIS — Y929 Unspecified place or not applicable: Secondary | ICD-10-CM | POA: Insufficient documentation

## 2018-09-12 DIAGNOSIS — Y9367 Activity, basketball: Secondary | ICD-10-CM | POA: Insufficient documentation

## 2018-09-12 DIAGNOSIS — Z79899 Other long term (current) drug therapy: Secondary | ICD-10-CM | POA: Insufficient documentation

## 2018-09-12 DIAGNOSIS — R52 Pain, unspecified: Secondary | ICD-10-CM

## 2018-09-12 DIAGNOSIS — Y998 Other external cause status: Secondary | ICD-10-CM | POA: Insufficient documentation

## 2018-09-12 DIAGNOSIS — W51XXXA Accidental striking against or bumped into by another person, initial encounter: Secondary | ICD-10-CM | POA: Insufficient documentation

## 2018-09-12 DIAGNOSIS — Y939 Activity, unspecified: Secondary | ICD-10-CM | POA: Insufficient documentation

## 2018-09-12 DIAGNOSIS — S82841A Displaced bimalleolar fracture of right lower leg, initial encounter for closed fracture: Secondary | ICD-10-CM

## 2018-09-12 MED ORDER — OXYCODONE-ACETAMINOPHEN 5-325 MG PO TABS
1.0000 | ORAL_TABLET | Freq: Once | ORAL | Status: AC
  Administered 2018-09-12: 1 via ORAL
  Filled 2018-09-12: qty 1

## 2018-09-12 MED ORDER — OXYCODONE-ACETAMINOPHEN 5-325 MG PO TABS: 1.0000 | ORAL_TABLET | Freq: Four times a day (QID) | ORAL | 0 refills | Status: DC | PRN

## 2018-09-12 MED ORDER — ONDANSETRON HCL 4 MG/2ML IJ SOLN
4.0000 mg | Freq: Once | INTRAMUSCULAR | Status: AC
  Administered 2018-09-12: 4 mg via INTRAVENOUS
  Filled 2018-09-12: qty 2

## 2018-09-12 MED ORDER — HYDROMORPHONE HCL 1 MG/ML IJ SOLN
1.0000 mg | Freq: Once | INTRAMUSCULAR | Status: AC
  Administered 2018-09-12: 1 mg via INTRAVENOUS
  Filled 2018-09-12: qty 1

## 2018-09-12 MED ORDER — PROPOFOL 10 MG/ML IV BOLUS
INTRAVENOUS | Status: AC | PRN
  Administered 2018-09-12: 60 mg via INTRAVENOUS

## 2018-09-12 MED ORDER — PROPOFOL 10 MG/ML IV BOLUS
0.5000 mg/kg | Freq: Once | INTRAVENOUS | Status: DC
  Filled 2018-09-12: qty 20

## 2018-09-12 NOTE — Progress Notes (Signed)
Orthopedic Tech Progress Note Patient Details:  Antonio [name] 23-Jan-1971 [id number]  Ortho Devices Type of Ortho Device: Ace wrap, Short leg splint, Stirrup splint Ortho Device/Splint Interventions: Application   Post Interventions Patient Tolerated: Well Instructions Provided: Care of device   [place] Pink 09/12/2018, 6:32 PM

## 2018-09-12 NOTE — ED Triage Notes (Signed)
Pt reports playing basketball x45 minutes ago, states he slipped and fell, and then his friend fell on top of his ankle. Pt right ankle rotated externally, swelling, not able to bear weight.

## 2018-09-12 NOTE — Discharge Instructions (Addendum)
Do not put weight on your right leg.  Keep the leg elevated as much as possible.  You can use ice to reduce swelling.  Get rechecked immediately if you develop severe uncontrolled pain, numbness, or new concerning symptoms.

## 2018-09-12 NOTE — ED Provider Notes (Signed)
[facility] EMERGENCY DEPARTMENT Provider Note   CSN: [id number] Arrival date & time: 09/12/18  1607    History   Chief Complaint Chief Complaint  Patient presents with  . Ankle Injury    HPI Skylur Fuston is a 48 y.o. male.     The history is provided by the patient and a friend. No language interpreter was used.  Ankle Injury    Sim Choquette is a 48 y.o. male who presents to the Emergency Department complaining of ankle injury. He presents to the emergency department complaining of injury to the right ankle that happened about 40 minutes prior to ED arrival. He was playing basketball when he slipped on some sand and another player landed on his ankle. He experienced immediate pain. He denies any additional injuries. He does state that he has had some beer today. Pain is severe and constant in nature, nonradiating. Past Medical History:  Diagnosis Date  . Alcoholic ([place])   . Chronic pain syndrome   . Complication of anesthesia    hard to wake up  . Depression   . Hepatitis    hep b and c  . Pain in thoracic spine   . Ruptured spleen     Patient Active Problem List   Diagnosis Date Noted  . Pancreatitis, acute 02/16/2016  . Chest pain 02/13/2016  . Alcoholic gastritis 76/72/0947  . 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 Syncope 02/12/2016  . Opioid dependence ([place]) 10/06/2012  . Unspecified episodic mood disorder 10/06/2012  . Alcohol dependence ([place]) 10/06/2012  . Polysubstance abuse ([place]) 02/24/2012  . Chronic back pain 02/24/2012  . Withdrawal from opioids ([place]) 02/24/2012  . Alcohol withdrawal ([place]) 02/24/2012  . 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 Thrombocytopenia ([place]) 02/24/2012  . Neutropenia- low relative neutrophil count 02/24/2012  . Dehydration with hypernatremia 02/24/2012  . Elevated CPK 02/24/2012  . Metabolic encephalopathy 2/2 alcohol and opiods/dehydration 02/24/2012  . Depression 02/24/2012  . Hepatitis B 02/24/2012  .  Hepatitis C 02/24/2012  . Alcohol abuse, daily use 06/02/2011    Past Surgical History:  Procedure Laterality Date  . NO PAST SURGERIES     spleen removed 5 yrs ago  . ORIF ANKLE FRACTURE Left 10/06/2013   Procedure: LEFT ANKLE FRACTURE OPEN TREATMENT BILMALLEOLAR ANKLE INCLUDES INTERNAL FIXATION, LEFT ANKLE FRACTURE OPEN TREATMENT DISTAL TIBIOFIBULAR INCLUDES INTERNAL FIXATION ;  Surgeon: Renette Butters, MD;  Location: [facility];  Service: Orthopedics;  Laterality: Left;  . spllenectomy          Home Medications    Prior to Admission medications   Medication Sig Start Date End Date Taking? Authorizing Provider  famotidine (PEPCID) 20 MG tablet Take 1 tablet (20 mg total) by mouth 2 (two) times daily. Patient not taking: Reported on 09/12/2018 02/20/16   Asencion Partridge, MD  FLUoxetine (PROZAC) 20 MG capsule Take 1 capsule (20 mg total) by mouth daily. Patient not taking: Reported on 09/12/2018 02/20/16   Asencion Partridge, MD  oxyCODONE-acetaminophen (PERCOCET/ROXICET) 5-325 MG tablet Take 1 tablet by mouth every 6 (six) hours as needed for severe pain. 09/12/18   Quintella Reichert, MD  sucralfate (CARAFATE) 1 g tablet Take 1 tablet (1 g total) by mouth 4 (four) times daily -  with meals and at bedtime. Patient not taking: Reported on 09/12/2018 02/20/16   Asencion Partridge, MD  traZODone (DESYREL) 50 MG tablet Take 1 tablet (50 mg total) by mouth at bedtime. Patient not taking: Reported on 09/12/2018 02/20/16  Asencion Partridge, MD    Family History No family history on file.  Social History Social History   Tobacco Use  . Smoking status: Current Every Day Smoker    Packs/day: 1.00    Years: 15.00    Pack years: 15.00    Types: Cigarettes  . Smokeless tobacco: Never Used  Substance Use Topics  . Alcohol use: Yes    Alcohol/week: 84.0 standard drinks    Types: 84 Cans of beer per week  . Drug use: No    Frequency: 28.0 times per week    Types: Hydrocodone, Heroin     Comment: heroin quit 5 years     Allergies   Fish allergy; Sulfa antibiotics; and Bee venom   Review of Systems Review of Systems  All other systems reviewed and are negative.    Physical Exam Updated Vital Signs BP (!) 130/92 (BP Location: Right Arm)   Pulse 71   Temp 98.5 F (36.9 C) (Oral)   Resp 15   Ht 5\' 6"  (1.676 m)   Wt 59 kg   SpO2 98%   BMI 20.98 kg/m   Physical Exam Vitals signs and nursing note reviewed.  Constitutional:      General: He is in acute distress.     Appearance: He is well-developed.  HENT:     Head: Normocephalic and atraumatic.  Cardiovascular:     Rate and Rhythm: Normal rate and regular rhythm.     Heart sounds: No murmur.  Pulmonary:     Effort: Pulmonary effort is normal. No respiratory distress.     Breath sounds: Normal breath sounds.  Abdominal:     Palpations: Abdomen is soft.     Tenderness: There is no abdominal tenderness. There is no guarding or rebound.  Musculoskeletal:        General: Swelling and tenderness present.     Comments: 2+ DP pulses bilaterally. There is significant edema and tenderness to the right ankle with lateral rotation of the ankle to the right. There is no knee or hip tenderness to palpation.  Skin:    General: Skin is warm and dry.  Neurological:     Mental Status: He is alert and oriented to person, place, and time.     Comments: Distal sensation to light touch intact throughout the feet.  Psychiatric:        Behavior: Behavior normal.      ED Treatments / Results  Labs (all labs ordered are listed, but only abnormal results are displayed) Labs Reviewed - No data to display  EKG None  Radiology Dg Ankle 2 Views Right  Result Date: 09/12/2018 CLINICAL DATA:  Post reduction EXAM: RIGHT ANKLE - 2 VIEW COMPARISON:  Plain film from earlier same day. FINDINGS: Improved alignment status post reduction. Ankle mortise is now symmetric. Overlying casting in place. IMPRESSION: Improved alignment  status post reduction and casting. Electronically Signed   By: Franki Cabot M.D.   On: 09/12/2018 18:32   Dg Ankle 2 Views Right  Result Date: 09/12/2018 CLINICAL DATA:  Basketball injury. EXAM: RIGHT ANKLE - 2 VIEW COMPARISON:  None. FINDINGS: Significantly displaced fractures of the medial malleolus and the lateral malleolus. Associated asymmetry of the ankle mortise with medial widening. Talar dome appears intact. Visualized portions of the hindfoot and midfoot appear intact and normally aligned. Prominent soft tissue swelling. IMPRESSION: Significantly displaced fractures of the medial malleolus and lateral malleolus. Associated asymmetry of the ankle mortise with medial widening. Electronically Signed  By: Franki Cabot M.D.   On: 09/12/2018 16:47    Procedures .Sedation Date/Time: 09/12/2018 6:45 PM Performed by: Quintella Reichert, MD Authorized by: Quintella Reichert, MD   Consent:    Consent obtained:  Verbal   Consent given by:  Patient   Risks discussed:  Allergic reaction, dysrhythmia, inadequate sedation, nausea, prolonged hypoxia resulting in organ damage, prolonged sedation necessitating reversal, respiratory compromise necessitating ventilatory assistance and intubation and vomiting   Alternatives discussed:  Analgesia without sedation, anxiolysis and regional anesthesia Universal protocol:    Procedure explained and questions answered to patient or proxy's satisfaction: yes     Relevant documents present and verified: yes     Test results available and properly labeled: yes     Imaging studies available: yes     Required blood products, implants, devices, and special equipment available: yes     Site/side marked: yes     Immediately prior to procedure a time out was called: yes     Patient identity confirmation method:  Verbally with patient Indications:    Procedure necessitating sedation performed by:  Physician performing sedation Pre-sedation assessment:    Time since  last food or drink:  Unknown   ASA classification: class 2 - patient with mild systemic disease     Neck mobility: normal     Mouth opening:  3 or more finger widths   Thyromental distance:  4 finger widths   Mallampati score:  I - soft palate, uvula, fauces, pillars visible   Pre-sedation assessments completed and reviewed: airway patency, cardiovascular function, hydration status, mental status, nausea/vomiting, pain level, respiratory function and temperature   Immediate pre-procedure details:    Reassessment: Patient reassessed immediately prior to procedure     Reviewed: vital signs, relevant labs/tests and NPO status     Verified: bag valve mask available, emergency equipment available, intubation equipment available, IV patency confirmed, oxygen available and suction available   Procedure details (see MAR for exact dosages):    Preoxygenation:  Nasal cannula   Sedation:  Propofol   Intra-procedure monitoring:  Blood pressure monitoring, cardiac monitor, continuous pulse oximetry, frequent LOC assessments, frequent vital sign checks and continuous capnometry   Intra-procedure events: none     Total Provider sedation time (minutes):  10 Post-procedure details:    Attendance: Constant attendance by certified staff until patient recovered     Recovery: Patient returned to pre-procedure baseline     Post-sedation assessments completed and reviewed: airway patency, cardiovascular function, hydration status, mental status, nausea/vomiting, pain level, respiratory function and temperature     Patient is stable for discharge or admission: yes     Patient tolerance:  Tolerated well, no immediate complications Reduction of fracture Date/Time: 09/12/2018 6:46 PM Performed by: Quintella Reichert, MD Authorized by: Quintella Reichert, MD  Consent: Verbal consent obtained. Consent given by: patient Patient understanding: patient states understanding of the procedure being performed Patient consent:  the patient's understanding of the procedure matches consent given Procedure consent: procedure consent matches procedure scheduled Relevant documents: relevant documents present and verified Test results: test results available and properly labeled Imaging studies: imaging studies available Patient identity confirmed: verbally with patient  Sedation: Patient sedated: yes  Patient tolerance: Patient tolerated the procedure well with no immediate complications    (including critical care time)  Medications Ordered in ED Medications  propofol (DIPRIVAN) 10 mg/mL bolus/IV push 29.5 mg (29.5 mg Intravenous See Procedure Record 09/12/18 1810)  HYDROmorphone (DILAUDID) injection 1 mg (1 mg Intravenous  Given 09/12/18 1642)  ondansetron (ZOFRAN) injection 4 mg (4 mg Intravenous Given 09/12/18 1642)  propofol (DIPRIVAN) 10 mg/mL bolus/IV push (60 mg Intravenous Given 09/12/18 1810)  oxyCODONE-acetaminophen (PERCOCET/ROXICET) 5-325 MG per tablet 1 tablet (1 tablet Oral Given 09/12/18 4174)   SPLINT APPLICATION Date/Time: 0:81 PM Authorized by: Quintella Reichert Consent: Verbal consent obtained. Risks and benefits: risks, benefits and alternatives were discussed Consent given by: patient Splint applied by: orthopedic technician and myself Location details: RLE Splint type: posterior stirrup Supplies used: orthoglass Post-procedure: The splinted body part was neurovascularly unchanged following the procedure. Patient tolerance: Patient tolerated the procedure well with no immediate complications.     Initial Impression / Assessment and Plan / ED Course  I have reviewed the triage vital signs and the nursing notes.  Pertinent labs & imaging results that were available during my care of the patient were reviewed by me and considered in my medical decision making (see chart for details).        Patient presents to the emergency department for evaluation of injuries after a basketball  accident. He does have significant swelling to the right ankle with deformity, distal perfusion is intact. He was treated with pain medications and consented for procedural sedation reduction. Procedure performed per note. Post reduction his symptoms were improved. Discussed with Dr. Alvan Dame with orthopedics. Plan to place and splint with nonweightbearing status to RLE. Discussed home care with elevation, ice. Discussed importance of outpatient follow-up as well as return precautions.  Final Clinical Impressions(s) / ED Diagnoses   Final diagnoses:  Closed bimalleolar fracture of right ankle, initial encounter    ED Discharge Orders         Ordered    oxyCODONE-acetaminophen (PERCOCET/ROXICET) 5-325 MG tablet  Every 6 hours PRN     09/12/18 1906           Quintella Reichert, MD 09/12/18 1958

## 2018-09-12 NOTE — ED Notes (Signed)
Ortho contacted for crutches training

## 2018-09-12 NOTE — ED Notes (Signed)
Patient verbalized understanding of discharge instructions. Opportunities for questioning and answers were provided. Armband removed by staff. Patient removed from ED.   

## 2018-09-17 ENCOUNTER — Other Ambulatory Visit (HOSPITAL_COMMUNITY): Payer: Self-pay | Admitting: Orthopedic Surgery

## 2018-09-17 ENCOUNTER — Encounter (HOSPITAL_BASED_OUTPATIENT_CLINIC_OR_DEPARTMENT_OTHER): Payer: Self-pay | Admitting: *Deleted

## 2018-09-17 ENCOUNTER — Other Ambulatory Visit: Payer: Self-pay

## 2018-09-17 DIAGNOSIS — F32A Depression, unspecified: Secondary | ICD-10-CM

## 2018-09-17 HISTORY — DX: Depression, unspecified: F32.A

## 2018-09-20 NOTE — Anesthesia Preprocedure Evaluation (Addendum)
Anesthesia Evaluation  Patient identified by MRN, date of birth, ID band Patient awake    Reviewed: Allergy & Precautions, NPO status , Patient's Chart, lab work & pertinent test results  Airway Mallampati: I  TM Distance: >3 FB Neck ROM: Full    Dental no notable dental hx. (+) Poor Dentition,    Pulmonary asthma , Current Smoker,    Pulmonary exam normal breath sounds clear to auscultation       Cardiovascular negative cardio ROS Normal cardiovascular exam Rhythm:Regular Rate:Normal     Neuro/Psych PSYCHIATRIC DISORDERS Depression negative neurological ROS     GI/Hepatic negative GI ROS, (+)     substance abuse  alcohol use, Hepatitis -, B, C  Endo/Other  negative endocrine ROS  Renal/GU negative Renal ROS  negative genitourinary   Musculoskeletal negative musculoskeletal ROS (+)   Abdominal   Peds  Hematology negative hematology ROS (+)   Anesthesia Other Findings Right trimalleolar ankle fracture  Reproductive/Obstetrics                            Anesthesia Physical Anesthesia Plan  ASA: II  Anesthesia Plan: General and Regional   Post-op Pain Management:  Regional for Post-op pain   Induction: Intravenous  PONV Risk Score and Plan: 1 and Midazolam, Dexamethasone and Ondansetron  Airway Management Planned: LMA  Additional Equipment:   Intra-op Plan:   Post-operative Plan: Extubation in OR  Informed Consent: I have reviewed the patients History and Physical, chart, labs and discussed the procedure including the risks, benefits and alternatives for the proposed anesthesia with the patient or authorized representative who has indicated his/her understanding and acceptance.     Dental advisory given  Plan Discussed with: CRNA  Anesthesia Plan Comments:         Anesthesia Quick Evaluation

## 2018-09-21 ENCOUNTER — Ambulatory Visit (HOSPITAL_BASED_OUTPATIENT_CLINIC_OR_DEPARTMENT_OTHER): Payer: Self-pay | Admitting: Certified Registered"

## 2018-09-21 ENCOUNTER — Other Ambulatory Visit: Payer: Self-pay

## 2018-09-21 ENCOUNTER — Encounter (HOSPITAL_BASED_OUTPATIENT_CLINIC_OR_DEPARTMENT_OTHER): Payer: Self-pay | Admitting: *Deleted

## 2018-09-21 ENCOUNTER — Ambulatory Visit (HOSPITAL_BASED_OUTPATIENT_CLINIC_OR_DEPARTMENT_OTHER)
Admission: RE | Admit: 2018-09-21 | Discharge: 2018-09-21 | Disposition: A | Discharge status: Home / Self Care | Payer: Self-pay | Source: Ambulatory Visit | Attending: Orthopedic Surgery | Admitting: Orthopedic Surgery

## 2018-09-21 ENCOUNTER — Encounter (HOSPITAL_BASED_OUTPATIENT_CLINIC_OR_DEPARTMENT_OTHER)
Admission: RE | Disposition: A | Discharge status: Home / Self Care | Payer: Self-pay | Source: Ambulatory Visit | Attending: Orthopedic Surgery

## 2018-09-21 DIAGNOSIS — Z882 Allergy status to sulfonamides status: Secondary | ICD-10-CM | POA: Insufficient documentation

## 2018-09-21 DIAGNOSIS — S82851A Displaced trimalleolar fracture of right lower leg, initial encounter for closed fracture: Secondary | ICD-10-CM | POA: Insufficient documentation

## 2018-09-21 DIAGNOSIS — Z91030 Bee allergy status: Secondary | ICD-10-CM | POA: Insufficient documentation

## 2018-09-21 DIAGNOSIS — B1910 Unspecified viral hepatitis B without hepatic coma: Secondary | ICD-10-CM | POA: Insufficient documentation

## 2018-09-21 DIAGNOSIS — F329 Major depressive disorder, single episode, unspecified: Secondary | ICD-10-CM | POA: Insufficient documentation

## 2018-09-21 DIAGNOSIS — Z79899 Other long term (current) drug therapy: Secondary | ICD-10-CM | POA: Insufficient documentation

## 2018-09-21 DIAGNOSIS — F17210 Nicotine dependence, cigarettes, uncomplicated: Secondary | ICD-10-CM | POA: Insufficient documentation

## 2018-09-21 DIAGNOSIS — Z91013 Allergy to seafood: Secondary | ICD-10-CM | POA: Insufficient documentation

## 2018-09-21 DIAGNOSIS — Y9367 Activity, basketball: Secondary | ICD-10-CM | POA: Insufficient documentation

## 2018-09-21 DIAGNOSIS — B1920 Unspecified viral hepatitis C without hepatic coma: Secondary | ICD-10-CM | POA: Insufficient documentation

## 2018-09-21 DIAGNOSIS — J45909 Unspecified asthma, uncomplicated: Secondary | ICD-10-CM | POA: Insufficient documentation

## 2018-09-21 HISTORY — DX: Unspecified asthma, uncomplicated: J45.909

## 2018-09-21 HISTORY — PX: ORIF ANKLE FRACTURE: SHX5408

## 2018-09-21 HISTORY — DX: Poisoning by unspecified drugs, medicaments and biological substances, accidental (unintentional), initial encounter: T50.901A

## 2018-09-21 SURGERY — OPEN REDUCTION INTERNAL FIXATION (ORIF) ANKLE FRACTURE
Anesthesia: Regional | Laterality: Right

## 2018-09-21 MED ORDER — DEXAMETHASONE SODIUM PHOSPHATE 10 MG/ML IJ SOLN
INTRAMUSCULAR | Status: DC | PRN
  Administered 2018-09-21: 10 mg via INTRAVENOUS

## 2018-09-21 MED ORDER — DEXMEDETOMIDINE HCL 200 MCG/2ML IV SOLN
INTRAVENOUS | Status: DC | PRN
  Administered 2018-09-21: 28 ug via INTRAVENOUS

## 2018-09-21 MED ORDER — OXYCODONE HCL 5 MG PO TABS: 5.0000 mg | ORAL_TABLET | ORAL | 0 refills | Status: AC | PRN

## 2018-09-21 MED ORDER — ACETAMINOPHEN 500 MG PO TABS
ORAL_TABLET | ORAL | Status: AC
  Filled 2018-09-21: qty 2

## 2018-09-21 MED ORDER — DEXMEDETOMIDINE HCL IN NACL 200 MCG/50ML IV SOLN
INTRAVENOUS | Status: AC
  Filled 2018-09-21: qty 50

## 2018-09-21 MED ORDER — ROPIVACAINE HCL 5 MG/ML IJ SOLN
INTRAMUSCULAR | Status: DC | PRN
  Administered 2018-09-21 (×2): 20 mL via PERINEURAL

## 2018-09-21 MED ORDER — FENTANYL CITRATE (PF) 100 MCG/2ML IJ SOLN: 25.0000 ug | INTRAMUSCULAR | Status: DC | PRN

## 2018-09-21 MED ORDER — FENTANYL CITRATE (PF) 100 MCG/2ML IJ SOLN
INTRAMUSCULAR | Status: AC
  Filled 2018-09-21: qty 2

## 2018-09-21 MED ORDER — SCOPOLAMINE 1 MG/3DAYS TD PT72: 1.0000 | MEDICATED_PATCH | Freq: Once | TRANSDERMAL | Status: DC | PRN

## 2018-09-21 MED ORDER — ACETAMINOPHEN 500 MG PO TABS
1000.0000 mg | ORAL_TABLET | Freq: Once | ORAL | Status: AC
  Administered 2018-09-21: 1000 mg via ORAL

## 2018-09-21 MED ORDER — DOCUSATE SODIUM 100 MG PO CAPS: 100.0000 mg | ORAL_CAPSULE | Freq: Two times a day (BID) | ORAL | 0 refills | Status: DC

## 2018-09-21 MED ORDER — ASPIRIN EC 81 MG PO TBEC: 81.0000 mg | DELAYED_RELEASE_TABLET | Freq: Two times a day (BID) | ORAL | 0 refills | Status: DC

## 2018-09-21 MED ORDER — ONDANSETRON HCL 4 MG/2ML IJ SOLN
INTRAMUSCULAR | Status: DC | PRN
  Administered 2018-09-21: 4 mg via INTRAVENOUS

## 2018-09-21 MED ORDER — FENTANYL CITRATE (PF) 100 MCG/2ML IJ SOLN
50.0000 ug | INTRAMUSCULAR | Status: AC | PRN
  Administered 2018-09-21 (×5): 25 ug via INTRAVENOUS
  Administered 2018-09-21 (×3): 50 ug via INTRAVENOUS
  Administered 2018-09-21: 25 ug via INTRAVENOUS

## 2018-09-21 MED ORDER — LACTATED RINGERS IV SOLN
INTRAVENOUS | Status: DC
  Administered 2018-09-21 (×2): via INTRAVENOUS

## 2018-09-21 MED ORDER — SODIUM CHLORIDE 0.9 % IV SOLN: INTRAVENOUS | Status: DC

## 2018-09-21 MED ORDER — CLONIDINE HCL (ANALGESIA) 100 MCG/ML EP SOLN
EPIDURAL | Status: DC | PRN
  Administered 2018-09-21 (×2): 50 ug

## 2018-09-21 MED ORDER — MIDAZOLAM HCL 2 MG/2ML IJ SOLN
1.0000 mg | INTRAMUSCULAR | Status: DC | PRN
  Administered 2018-09-21: 2 mg via INTRAVENOUS

## 2018-09-21 MED ORDER — SENNA 8.6 MG PO TABS: 2.0000 | ORAL_TABLET | Freq: Two times a day (BID) | ORAL | 0 refills | Status: DC

## 2018-09-21 MED ORDER — CHLORHEXIDINE GLUCONATE 4 % EX LIQD: 60.0000 mL | Freq: Once | CUTANEOUS | Status: DC

## 2018-09-21 MED ORDER — PROPOFOL 10 MG/ML IV BOLUS
INTRAVENOUS | Status: DC | PRN
  Administered 2018-09-21: 250 mg via INTRAVENOUS

## 2018-09-21 MED ORDER — MIDAZOLAM HCL 2 MG/2ML IJ SOLN
INTRAMUSCULAR | Status: AC
  Filled 2018-09-21: qty 2

## 2018-09-21 MED ORDER — CEFAZOLIN SODIUM-DEXTROSE 2-4 GM/100ML-% IV SOLN
INTRAVENOUS | Status: AC
  Filled 2018-09-21: qty 100

## 2018-09-21 MED ORDER — CEFAZOLIN SODIUM-DEXTROSE 2-4 GM/100ML-% IV SOLN
2.0000 g | INTRAVENOUS | Status: AC
  Administered 2018-09-21: 2 g via INTRAVENOUS

## 2018-09-21 SURGICAL SUPPLY — 80 items
BANDAGE ESMARK 6X9 LF (GAUZE/BANDAGES/DRESSINGS) ×1 IMPLANT
BIT DRILL 2.5X2.75 QC CALB (BIT) ×3 IMPLANT
BIT DRILL 2.9 CANN QC NONSTRL (BIT) ×3 IMPLANT
BIT DRILL 3.5X5.5 QC CALB (BIT) ×3 IMPLANT
BLADE SURG 15 STRL LF DISP TIS (BLADE) ×2 IMPLANT
BLADE SURG 15 STRL SS (BLADE) ×4
BNDG COHESIVE 4X5 TAN STRL (GAUZE/BANDAGES/DRESSINGS) ×3 IMPLANT
BNDG COHESIVE 6X5 TAN STRL LF (GAUZE/BANDAGES/DRESSINGS) ×3 IMPLANT
BNDG ESMARK 4X9 LF (GAUZE/BANDAGES/DRESSINGS) IMPLANT
BNDG ESMARK 6X9 LF (GAUZE/BANDAGES/DRESSINGS) ×3
CANISTER SUCT 1200ML W/VALVE (MISCELLANEOUS) ×3 IMPLANT
CHLORAPREP W/TINT 26 (MISCELLANEOUS) ×3 IMPLANT
COVER BACK TABLE REUSABLE LG (DRAPES) ×3 IMPLANT
COVER WAND RF STERILE (DRAPES) IMPLANT
CUFF TOURN SGL QUICK 34 (TOURNIQUET CUFF) ×2
CUFF TRNQT CYL 34X4.125X (TOURNIQUET CUFF) ×1 IMPLANT
DECANTER SPIKE VIAL GLASS SM (MISCELLANEOUS) IMPLANT
DRAPE EXTREMITY T 121X128X90 (DISPOSABLE) ×3 IMPLANT
DRAPE OEC MINIVIEW 54X84 (DRAPES) ×3 IMPLANT
DRAPE U-SHAPE 47X51 STRL (DRAPES) IMPLANT
DRSG MEPITEL 4X7.2 (GAUZE/BANDAGES/DRESSINGS) ×3 IMPLANT
DRSG PAD ABDOMINAL 8X10 ST (GAUZE/BANDAGES/DRESSINGS) ×6 IMPLANT
ELECT REM PT RETURN 9FT ADLT (ELECTROSURGICAL) ×3
ELECTRODE REM PT RTRN 9FT ADLT (ELECTROSURGICAL) ×1 IMPLANT
GAUZE SPONGE 4X4 12PLY STRL (GAUZE/BANDAGES/DRESSINGS) ×3 IMPLANT
GLOVE BIO SURGEON STRL SZ 6.5 (GLOVE) ×2 IMPLANT
GLOVE BIO SURGEON STRL SZ8 (GLOVE) ×3 IMPLANT
GLOVE BIO SURGEONS STRL SZ 6.5 (GLOVE) ×1
GLOVE BIOGEL PI IND STRL 8 (GLOVE) ×2 IMPLANT
GLOVE BIOGEL PI INDICATOR 8 (GLOVE) ×4
GLOVE ECLIPSE 8.0 STRL XLNG CF (GLOVE) ×3 IMPLANT
GOWN STRL REUS W/ TWL LRG LVL3 (GOWN DISPOSABLE) ×1 IMPLANT
GOWN STRL REUS W/ TWL XL LVL3 (GOWN DISPOSABLE) ×2 IMPLANT
GOWN STRL REUS W/TWL LRG LVL3 (GOWN DISPOSABLE) ×2
GOWN STRL REUS W/TWL XL LVL3 (GOWN DISPOSABLE) ×4
K-WIRE ACE 1.6X6 (WIRE) ×6
KWIRE ACE 1.6X6 (WIRE) ×2 IMPLANT
NEEDLE HYPO 22GX1.5 SAFETY (NEEDLE) IMPLANT
NS IRRIG 1000ML POUR BTL (IV SOLUTION) ×3 IMPLANT
PACK BASIN DAY SURGERY FS (CUSTOM PROCEDURE TRAY) ×3 IMPLANT
PAD CAST 4YDX4 CTTN HI CHSV (CAST SUPPLIES) ×1 IMPLANT
PADDING CAST ABS 4INX4YD NS (CAST SUPPLIES)
PADDING CAST ABS COTTON 4X4 ST (CAST SUPPLIES) IMPLANT
PADDING CAST COTTON 4X4 STRL (CAST SUPPLIES) ×2
PADDING CAST COTTON 6X4 STRL (CAST SUPPLIES) ×3 IMPLANT
PENCIL BUTTON HOLSTER BLD 10FT (ELECTRODE) ×3 IMPLANT
PLATE ACE 100DEG 8HOLE (Plate) ×3 IMPLANT
PLATE SPIDER 16 (Washer) ×3 IMPLANT
SANITIZER HAND PURELL 535ML FO (MISCELLANEOUS) ×3 IMPLANT
SCREW ACE CAN 4.0 40M (Screw) ×6 IMPLANT
SCREW ACE CAN 4.0 50M (Screw) ×3 IMPLANT
SCREW CORTICAL 3.5MM  12MM (Screw) ×2 IMPLANT
SCREW CORTICAL 3.5MM  16MM (Screw) ×2 IMPLANT
SCREW CORTICAL 3.5MM 12MM (Screw) ×1 IMPLANT
SCREW CORTICAL 3.5MM 14MM (Screw) ×9 IMPLANT
SCREW CORTICAL 3.5MM 16MM (Screw) ×1 IMPLANT
SCREW CORTICAL 3.5MM 22MM (Screw) ×6 IMPLANT
SCREW CORTICAL 3.5MM 26MM (Screw) ×3 IMPLANT
SHEET MEDIUM DRAPE 40X70 STRL (DRAPES) ×3 IMPLANT
SLEEVE SCD COMPRESS KNEE MED (MISCELLANEOUS) ×3 IMPLANT
SPLINT FAST PLASTER 5X30 (CAST SUPPLIES) ×40
SPLINT PLASTER CAST FAST 5X30 (CAST SUPPLIES) ×20 IMPLANT
SPONGE LAP 18X18 RF (DISPOSABLE) ×6 IMPLANT
STOCKINETTE 6  STRL (DRAPES) ×2
STOCKINETTE 6 STRL (DRAPES) ×1 IMPLANT
SUCTION FRAZIER HANDLE 10FR (MISCELLANEOUS) ×2
SUCTION TUBE FRAZIER 10FR DISP (MISCELLANEOUS) ×1 IMPLANT
SUT ETHILON 3 0 PS 1 (SUTURE) ×9 IMPLANT
SUT FIBERWIRE #2 38 T-5 BLUE (SUTURE)
SUT MNCRL AB 3-0 PS2 18 (SUTURE) ×3 IMPLANT
SUT VIC AB 0 SH 27 (SUTURE) ×3 IMPLANT
SUT VIC AB 2-0 SH 27 (SUTURE) ×2
SUT VIC AB 2-0 SH 27XBRD (SUTURE) ×1 IMPLANT
SUTURE FIBERWR #2 38 T-5 BLUE (SUTURE) IMPLANT
SYR BULB 3OZ (MISCELLANEOUS) ×3 IMPLANT
SYR CONTROL 10ML LL (SYRINGE) IMPLANT
TOWEL GREEN STERILE FF (TOWEL DISPOSABLE) ×6 IMPLANT
TUBE CONNECTING 20'X1/4 (TUBING) ×2
TUBE CONNECTING 20X1/4 (TUBING) ×4 IMPLANT
UNDERPAD 30X30 (UNDERPADS AND DIAPERS) ×3 IMPLANT

## 2018-09-21 NOTE — Transfer of Care (Signed)
Immediate Anesthesia Transfer of Care Note  Patient: Antonio [name]  Procedure(s) Performed: Open reduction internal fixation right trimalleolar ankle fracture (Right )  Patient Location: PACU  Anesthesia Type:GA combined with regional for post-op pain  Level of Consciousness: sedated  Airway & Oxygen Therapy: Patient Spontanous Breathing and Patient connected to face mask oxygen  Post-op Assessment: Report given to RN and Post -op Vital signs reviewed and stable  Post vital signs: Reviewed and stable  Last Vitals:  Vitals Value Taken Time  BP 121/85 09/21/2018 11:23 AM  Temp    Pulse 63 09/21/2018 11:26 AM  Resp 15 09/21/2018 11:26 AM  SpO2 100 % 09/21/2018 11:26 AM  Vitals shown include unvalidated device data.  Last Pain:  Vitals:   09/21/18 0818  TempSrc: Oral  PainSc: 0-No pain         Complications: No apparent anesthesia complications

## 2018-09-21 NOTE — Discharge Instructions (Addendum)
Antonio Hewitt, MD °[facility] ° °Please read the following information regarding your care after surgery. ° °Medications  °You only need a prescription for the narcotic pain medicine (ex. oxycodone, Percocet, Norco).  All of the other medicines listed below are available over the counter. °X Aleve 2 pills twice a day for the first 3 days after surgery. °X acetominophen (Tylenol) 650 mg every 4-6 hours as you need for minor to moderate pain °X oxycodone as prescribed for severe pain ° °Narcotic pain medicine (ex. oxycodone, Percocet, Vicodin) will cause constipation.  To prevent this problem, take the following medicines while you are taking any pain medicine. °X docusate sodium (Colace) 100 mg twice a day X senna (Senokot) 2 tablets twice a day ° °X To help prevent blood clots, take a baby aspirin (81 mg) twice a day after surgery.  You should also get up every hour while you are awake to move around.   ° °Weight Bearing °X Do not bear any weight on the operated leg or foot. ° °Cast / Splint / Dressing °X Keep your splint, cast or dressing clean and dry.  Don’t put anything (coat hanger, pencil, etc) down inside of it.  If it gets damp, use a hair dryer on the cool setting to dry it.  If it gets soaked, call the office to schedule an appointment for a cast change. ° °After your dressing, cast or splint is removed; you may shower, but do not soak or scrub the wound.  Allow the water to run over it, and then gently pat it dry. ° °Swelling °It is normal for you to have swelling where you had surgery.  To reduce swelling and pain, keep your toes above your nose for at least 3 days after surgery.  It may be necessary to keep your foot or leg elevated for several weeks.  If it hurts, it should be elevated. ° °Follow Up °Call my office at [phone] when you are discharged from the hospital or surgery center to schedule an appointment to be seen two weeks after surgery. ° °Call my office at [phone] if you  develop a fever >101.5° F, nausea, vomiting, bleeding from the surgical site or severe pain.   ° ° ° °Post Anesthesia Home Care Instructions ° °Activity: °Get plenty of rest for the remainder of the day. A responsible individual must stay with you for 24 hours following the procedure.  °For the next 24 hours, DO NOT: °-Drive a car °-Operate machinery °-Drink alcoholic beverages °-Take any medication unless instructed by your physician °-Make any legal decisions or sign important papers. ° °Meals: °Start with liquid foods such as gelatin or soup. Progress to regular foods as tolerated. Avoid greasy, spicy, heavy foods. If nausea and/or vomiting occur, drink only clear liquids until the nausea and/or vomiting subsides. Call your physician if vomiting continues. ° °Special Instructions/Symptoms: °Your throat may feel dry or sore from the anesthesia or the breathing tube placed in your throat during surgery. If this causes discomfort, gargle with warm salt water. The discomfort should disappear within 24 hours. ° °If you had a scopolamine patch placed behind your ear for the management of post- operative nausea and/or vomiting: ° °1. The medication in the patch is effective for 72 hours, after which it should be removed.  Wrap patch in a tissue and discard in the trash. Wash hands thoroughly with soap and water. °2. You may remove the patch earlier than 72 hours if you experience unpleasant side   effects which may include dry mouth, dizziness or visual disturbances. °3. Avoid touching the patch. Wash your hands with soap and water after contact with the patch. °  ° ° °Regional Anesthesia Blocks ° °1. Numbness or the inability to move the "blocked" extremity may last from 3-48 hours after placement. The length of time depends on the medication injected and your individual response to the medication. If the numbness is not going away after 48 hours, call your surgeon. ° °2. The extremity that is blocked will need to be  protected until the numbness is gone and the  Strength has returned. Because you cannot feel it, you will need to take extra care to avoid injury. Because it may be weak, you may have difficulty moving it or using it. You may not know what position it is in without looking at it while the block is in effect. ° °3. For blocks in the legs and feet, returning to weight bearing and walking needs to be done carefully. You will need to wait until the numbness is entirely gone and the strength has returned. You should be able to move your leg and foot normally before you try and bear weight or walk. You will need someone to be with you when you first try to ensure you do not fall and possibly risk injury. ° °4. Bruising and tenderness at the needle site are common side effects and will resolve in a few days. ° °5. Persistent numbness or new problems with movement should be communicated to the surgeon or the [facility] ([phone])/ [facility] ([phone]). °

## 2018-09-21 NOTE — Anesthesia Procedure Notes (Signed)
Anesthesia Regional Block: Adductor canal block   Pre-Anesthetic Checklist: ,, timeout performed, Correct Patient, Correct Site, Correct Laterality, Correct Procedure, Correct Position, site marked, Risks and benefits discussed,  Surgical consent,  Pre-op evaluation,  At surgeon's request and post-op pain management  Laterality: Right  Prep: Maximum Sterile Barrier Precautions used, chloraprep       Needles:  Injection technique: Single-shot  Needle Type: Echogenic Stimulator Needle     Needle Length: 9cm  Needle Gauge: 22     Additional Needles:   Procedures:,,,, ultrasound used (permanent image in chart),,,,  Narrative:  Start time: 09/21/2018 8:54 AM End time: 09/21/2018 9:04 AM Injection made incrementally with aspirations every 5 mL.  Performed by: Personally  Anesthesiologist: Freddrick March, MD  Additional Notes: Monitors applied. No increased pain on injection. No increased resistance to injection. Injection made in 5cc increments. Good needle visualization. Patient tolerated procedure well.

## 2018-09-21 NOTE — H&P (Signed)
Antonio [name] is an 48 y.o. male.   Chief Complaint:  Right ankle pain HPI: The patient is a 48 year old male with a past medical history significant for hepatitis B and C as well as polysubstance abuse.  He injured his right ankle playing basketball with his grandson about a week ago.  He sustained a trimalleolar fracture dislocation.  He was reduced in the emergency room and then re-displaced requiring reduction in the Office.  He presents now for operative treatment of this displaced and unstable right ankle trimalleolar fracture dislocation.  Past Medical History:  Diagnosis Date  . Alcoholic ([place])    in recovery since 02/2018  . Asthma   . Complication of anesthesia    hard to wake up  . Depression 09/17/2018   h/o suicidal ideation, previously homeless.  . Hepatitis    hep b and c  . Polysubstance overdose   . Ruptured spleen     Past Surgical History:  Procedure Laterality Date  . NO PAST SURGERIES     spleen removed 5 yrs ago  . ORIF ANKLE FRACTURE Left 10/06/2013   Procedure: LEFT ANKLE FRACTURE OPEN TREATMENT BILMALLEOLAR ANKLE INCLUDES INTERNAL FIXATION, LEFT ANKLE FRACTURE OPEN TREATMENT DISTAL TIBIOFIBULAR INCLUDES INTERNAL FIXATION ;  Surgeon: Renette Butters, MD;  Location: [facility];  Service: Orthopedics;  Laterality: Left;  . spllenectomy      History reviewed. No pertinent family history. Social History:  reports that he has been smoking cigarettes. He has a 15.00 pack-year smoking history. He has never used smokeless tobacco. He reports current alcohol use of about 84.0 standard drinks of alcohol per week. He reports previous drug use. Frequency: 28.00 times per week. Drugs: Hydrocodone and Heroin.  Allergies:  Allergies  Allergen Reactions  . Fish Allergy Itching and Swelling  . Sulfa Antibiotics Swelling  . Bee Venom Swelling    Medications Prior to Admission  Medication Sig Dispense Refill  . oxyCODONE-acetaminophen  (PERCOCET/ROXICET) 5-325 MG tablet Take 1 tablet by mouth every 6 (six) hours as needed for severe pain. 15 tablet 0  . sertraline (ZOLOFT) 100 MG tablet Take 100 mg by mouth daily.       No results found for this or any previous visit (from the past 48 hour(s)). No results found.  ROS no recent fever, chills, nausea, vomiting or changes in his appetite.  Blood pressure (!) 133/94, pulse 78, temperature 98.5 F (36.9 C), temperature source Oral, resp. rate 12, height 5\' 6"  (1.676 m), weight 66.4 kg, SpO2 97 %. Physical Exam  Well-nourished well-developed male in no apparent distress.  Alert and oriented x4.  Mood and affect are normal.  Extraocular motions are intact.  Respirations are unlabored.  Gait is nonweightbearing on the right.  The right lower extremity is splinted.  Brisk capillary refill at the toes.  Intact sensibility to light touch dorsally and plantarly at the forefoot.  5 out of 5 strength in plantar flexion and dorsiflexion of the toes.  Assessment/Plan Right ankle closed and displaced trimalleolar fracture dislocation -to the operating room today for open treatment of this displaced and unstable ankle injury.  The risks and benefits of the alternative treatment options have been discussed in detail.  The patient wishes to proceed with surgery and specifically understands risks of bleeding, infection, nerve damage, blood clots, need for additional surgery, amputation and death.   Wylene Simmer, MD 03-Oct-2018, 8:58 AM

## 2018-09-21 NOTE — Op Note (Signed)
09/21/2018  11:33 AM  PATIENT:  Antonio [name]  48 y.o. male  PRE-OPERATIVE DIAGNOSIS: Displaced right ankle trimalleolar fracture dislocation  POST-OPERATIVE DIAGNOSIS: Same  Procedure(s): 1.  Open treatment of right ankle trimalleolar fracture with internal fixation including fixation of the posterior lip 2.  Stress examination of the right ankle under fluoroscopy 3.  AP, mortise and lateral radiographs of the right ankle  SURGEON:  Wylene Simmer, MD  ASSISTANT: Mechele Claude, PA-C  ANESTHESIA:   General, regional  EBL:  minimal   TOURNIQUET:   Total Tourniquet Time Documented: Thigh (Right) - 79 minutes Total: Thigh (Right) - 79 minutes  COMPLICATIONS:  None apparent  DISPOSITION:  Extubated, awake and stable to recovery.  INDICATION FOR PROCEDURE: The patient is a 48 year old male with a past medical history significant for smoking, polysubstance abuse and hepatitis B and C.  He was playing basketball with his grandson last week when he injured his right ankle.  He sustained a fracture dislocation and was seen in the emergency room.  He underwent closed reduction.  By the time he saw me in clinic reduction was lost and his ankle was dislocated again.  He underwent repeat reduction of the ankle and splinting.  He presents now for operative treatment of this displaced and unstable right ankle trimalleolar fracture.  The risks and benefits of the alternative treatment options have been discussed in detail.  The patient wishes to proceed with surgery and specifically understands risks of bleeding, infection, nerve damage, blood clots, need for additional surgery, amputation and death.  PROCEDURE IN DETAIL:  After pre operative consent was obtained, and the correct operative site was identified, the patient was brought to the operating room and placed supine on the OR table.  Anesthesia was administered.  Pre-operative antibiotics were administered.  A surgical timeout was taken.  The  patient was then turned into the lateral position with a beanbag.  The right lower extremity was then prepped and draped in standard sterile fashion with a tourniquet around the thigh.  The extremity was elevated and the tourniquet was inflated to 250 mmHg.  A posterior lateral incision was made between the peroneals and the Achilles at the right ankle.  Dissection was carried down through the subcutaneous tissues.  Branches of the sural nerve were identified and were protected throughout the case.  The interval between the peroneals and the FHL tendon was developed.  The posterior malleolus fracture was identified.  It was cleaned of all hematoma and irrigated copiously.  It was reduced and provisionally pinned.  AP and lateral radiographs confirmed appropriate reduction of the posterior malleolus fracture.  A 4 mm partially-threaded cannulated screw and a small spider washer were then placed over the guidewire and secured the posterior fracture appropriately.  Dissection was then carried anterior to the peroneal tendons.  The fibular fracture was identified.  It was irrigated copiously and cleaned of all hematoma.  The fracture was reduced and provisionally held with a tenaculum.  A 3.5 mm fully threaded lag screw was inserted from posterior to anterior across the fracture site.  An 8 hole one third tubular plate was then contoured to fit the lateral malleolus.  It was secured distally with 3 unicortical screws and proximally with 4 bicortical screws.  AP and lateral radiographs confirmed appropriate reduction of the fibula and appropriate position and length of all of the screws.  Attention was then turned to the medial side of the ankle where a longitudinal incision was made.  Dissection was carried down through the subcutaneous tissues.  The fracture site was mobilized and cleaned of all hematoma and periosteum.  The fracture was reduced and held with a tenaculum.  2 K wires were placed across the fracture  site.  Radiographs confirmed appropriate alignment of the guidepins and appropriate reduction of the fracture.  Both guide pins were overdrilled and 4 mm partially-threaded cannulated screws were inserted.  Both were noted to have excellent purchase and compressed the fracture site appropriately.  All hardware was from the [employer] titanium small frag set.  Final AP, mortise and lateral radiographs confirmed appropriate position and length of all hardware and appropriate reduction of the ankle fracture dislocation.  Stress examination was performed showing no evidence of instability at the syndesmosis or ankle mortise.  Both wounds were then irrigated copiously.  Subcutaneous tissues were approximated with Vicryl and Monocryl.  Skin incisions were closed with horizontal mattress sutures of 3-0 nylon.  Sterile dressings were applied followed by a well-padded short leg splint.  Tourniquet was released after application of the dressings.  The patient was awakened from anesthesia and transported to the recovery room in stable condition.   FOLLOW UP PLAN: Nonweightbearing on the right lower extremity for 6 weeks postop.  Follow-up in the office in 2 weeks for suture removal and conversion to a short leg cast.  Aspirin 81 mg p.o. twice daily for DVT prophylaxis.   RADIOGRAPHS: AP, mortise and lateral radiographs of the right ankle are obtained intraoperatively.  These show interval reduction and fixation of the trimalleolar ankle fracture.  Hardware is appropriately positioned and of the appropriate lengths.    Mechele Claude PA-C was present and scrubbed for the duration of the operative case. His assistance was essential in positioning the patient, prepping and draping, gaining and maintaining exposure, performing the operation, closing and dressing the wounds and applying the splint.

## 2018-09-21 NOTE — Anesthesia Procedure Notes (Signed)
Procedure Name: LMA Insertion Date/Time: 09/21/2018 9:36 AM Performed by: Maryella Shivers, CRNA Pre-anesthesia Checklist: Patient identified, Emergency Drugs available, Suction available and Patient being monitored Patient Re-evaluated:Patient Re-evaluated prior to induction Oxygen Delivery Method: Circle system utilized Preoxygenation: Pre-oxygenation with 100% oxygen Induction Type: IV induction Ventilation: Mask ventilation without difficulty LMA: LMA inserted LMA Size: 4.0 Number of attempts: 1 Airway Equipment and Method: Bite block Placement Confirmation: positive ETCO2 Tube secured with: Tape Dental Injury: Teeth and Oropharynx as per pre-operative assessment

## 2018-09-21 NOTE — Progress Notes (Signed)
Assisted D. Woodrum with right, ultrasound guided, popliteal/saphenous, adductor canal block. Side rails up, monitors on throughout procedure. See vital signs in flow sheet. Tolerated Procedure well.

## 2018-09-21 NOTE — Anesthesia Procedure Notes (Signed)
Anesthesia Regional Block: Popliteal block   Pre-Anesthetic Checklist: ,, timeout performed, Correct Patient, Correct Site, Correct Laterality, Correct Procedure, Correct Position, site marked, Risks and benefits discussed,  Surgical consent,  Pre-op evaluation,  At surgeon's request and post-op pain management  Laterality: Right  Prep: Maximum Sterile Barrier Precautions used, chloraprep       Needles:  Injection technique: Single-shot  Needle Type: Echogenic Stimulator Needle     Needle Length: 9cm  Needle Gauge: 22     Additional Needles:   Procedures:,,,, ultrasound used (permanent image in chart),,,,  Narrative:  Start time: 09/21/2018 9:05 AM End time: 09/21/2018 9:15 AM Injection made incrementally with aspirations every 5 mL.  Performed by: Personally  Anesthesiologist: Freddrick March, MD  Additional Notes: Monitors applied. No increased pain on injection. No increased resistance to injection. Injection made in 5cc increments. Good needle visualization. Patient tolerated procedure well.

## 2018-09-21 NOTE — Anesthesia Postprocedure Evaluation (Signed)
Anesthesia Post Note  Patient: Antonio [name]  Procedure(s) Performed: Open reduction internal fixation right trimalleolar ankle fracture (Right )     Patient location during evaluation: PACU Anesthesia Type: Regional and General Level of consciousness: awake and alert Pain management: pain level controlled Vital Signs Assessment: post-procedure vital signs reviewed and stable Respiratory status: spontaneous breathing, nonlabored ventilation and respiratory function stable Cardiovascular status: blood pressure returned to baseline and stable Postop Assessment: no apparent nausea or vomiting Anesthetic complications: no    Last Vitals:  Vitals:   09/21/18 1215 09/21/18 1257  BP: (!) 150/93 118/83  Pulse: (!) 58 62  Resp: 14 18  Temp:  37 C  SpO2: 96% 98%    Last Pain:  Vitals:   09/21/18 1257  TempSrc:   PainSc: 0-No pain                 Lynda Rainwater

## 2018-09-23 ENCOUNTER — Encounter (HOSPITAL_BASED_OUTPATIENT_CLINIC_OR_DEPARTMENT_OTHER): Payer: Self-pay | Admitting: Orthopedic Surgery

## 2018-12-17 ENCOUNTER — Emergency Department (HOSPITAL_COMMUNITY): Payer: Self-pay

## 2018-12-17 ENCOUNTER — Other Ambulatory Visit: Payer: Self-pay

## 2018-12-17 ENCOUNTER — Encounter (HOSPITAL_COMMUNITY): Payer: Self-pay | Admitting: Emergency Medicine

## 2018-12-17 ENCOUNTER — Emergency Department (HOSPITAL_COMMUNITY)
Admission: EM | Admit: 2018-12-17 | Discharge: 2018-12-17 | Disposition: A | Discharge status: Home / Self Care | Payer: Self-pay | Attending: Emergency Medicine | Admitting: Emergency Medicine

## 2018-12-17 DIAGNOSIS — Z882 Allergy status to sulfonamides status: Secondary | ICD-10-CM | POA: Insufficient documentation

## 2018-12-17 DIAGNOSIS — Z79899 Other long term (current) drug therapy: Secondary | ICD-10-CM | POA: Insufficient documentation

## 2018-12-17 DIAGNOSIS — Z7982 Long term (current) use of aspirin: Secondary | ICD-10-CM | POA: Insufficient documentation

## 2018-12-17 DIAGNOSIS — F17210 Nicotine dependence, cigarettes, uncomplicated: Secondary | ICD-10-CM | POA: Insufficient documentation

## 2018-12-17 DIAGNOSIS — Z91018 Allergy to other foods: Secondary | ICD-10-CM | POA: Insufficient documentation

## 2018-12-17 DIAGNOSIS — J45909 Unspecified asthma, uncomplicated: Secondary | ICD-10-CM | POA: Insufficient documentation

## 2018-12-17 DIAGNOSIS — Z91030 Bee allergy status: Secondary | ICD-10-CM | POA: Insufficient documentation

## 2018-12-17 DIAGNOSIS — F10220 Alcohol dependence with intoxication, uncomplicated: Secondary | ICD-10-CM | POA: Insufficient documentation

## 2018-12-17 DIAGNOSIS — M79642 Pain in left hand: Secondary | ICD-10-CM | POA: Insufficient documentation

## 2018-12-17 MED ORDER — NAPROXEN 500 MG PO TABS: 500.0000 mg | ORAL_TABLET | Freq: Two times a day (BID) | ORAL | 0 refills | Status: AC

## 2018-12-17 MED ORDER — IBUPROFEN 400 MG PO TABS
600.0000 mg | ORAL_TABLET | Freq: Once | ORAL | Status: AC
  Administered 2018-12-17: 600 mg via ORAL
  Filled 2018-12-17: qty 1

## 2018-12-17 MED ORDER — ACETAMINOPHEN 325 MG PO TABS
650.0000 mg | ORAL_TABLET | Freq: Once | ORAL | Status: DC
  Filled 2018-12-17: qty 2

## 2018-12-17 NOTE — ED Provider Notes (Signed)
[facility] EMERGENCY DEPARTMENT Provider Note   CSN: [id number] Arrival date & time: 12/17/18  1215    History   Chief Complaint Chief Complaint  Patient presents with  . Alcohol Intoxication  . Hand Injury    HPI Antonio [name] is a 48 y.o. male.     48 y.o male with a PMH of Alcohol intoxication presents to the ED with a chief complaint of left hand injury x yesterday. Patient reports he was angry at friends, states he punched a wall with his left hand.  He reports pain along the dorsum aspect of the hand, this is worse with hand flexion along with movement.  Bruising and swelling noted to the fourth and fifth digits.  Patient also reports "drinking a little this morning ", states he usually drinks daily.  He denies any fevers, other injuries at this time.  The history is provided by the patient.  Alcohol Intoxication Pertinent negatives include no chest pain, no abdominal pain, no headaches and no shortness of breath.  Hand Injury Associated symptoms: no fever     Past Medical History:  Diagnosis Date  . 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 Asthma   . Complication of anesthesia    hard to wake up  . Depression 09/17/2018   h/o suicidal ideation, previously homeless.  . Hepatitis    hep b and c  . Polysubstance overdose   . Ruptured spleen     Patient Active Problem List   Diagnosis Date Noted  . Pancreatitis, acute 02/16/2016  . Chest pain 02/13/2016  . Alcoholic gastritis 40/98/1191  . 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 Syncope 02/12/2016  . Opioid dependence ([place]) 10/06/2012  . Unspecified episodic mood disorder 10/06/2012  . Alcohol dependence ([place]) 10/06/2012  . Polysubstance abuse ([place]) 02/24/2012  . Chronic back pain 02/24/2012  . Withdrawal from opioids ([place]) 02/24/2012  . Alcohol withdrawal ([place]) 02/24/2012  . 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 Thrombocytopenia ([place]) 02/24/2012  . Neutropenia- low relative neutrophil  count 02/24/2012  . Dehydration with hypernatremia 02/24/2012  . Elevated CPK 02/24/2012  . Metabolic encephalopathy 2/2 alcohol and opiods/dehydration 02/24/2012  . Depression 02/24/2012  . Hepatitis B 02/24/2012  . Hepatitis C 02/24/2012  . Alcohol abuse, daily use 06/02/2011    Past Surgical History:  Procedure Laterality Date  . NO PAST SURGERIES     spleen removed 5 yrs ago  . ORIF ANKLE FRACTURE Left 10/06/2013   Procedure: LEFT ANKLE FRACTURE OPEN TREATMENT BILMALLEOLAR ANKLE INCLUDES INTERNAL FIXATION, LEFT ANKLE FRACTURE OPEN TREATMENT DISTAL TIBIOFIBULAR INCLUDES INTERNAL FIXATION ;  Surgeon: Renette Butters, MD;  Location: [facility];  Service: Orthopedics;  Laterality: Left;  . ORIF ANKLE FRACTURE Right 09/21/2018   Procedure: Open reduction internal fixation right trimalleolar ankle fracture;  Surgeon: Wylene Simmer, MD;  Location: [facility];  Service: Orthopedics;  Laterality: Right;  46min  ok per OR desk to follow in Room 5  . spllenectomy          Home Medications    Prior to Admission medications   Medication Sig Start Date End Date Taking? Authorizing Provider  aspirin EC 81 MG tablet Take 1 tablet (81 mg total) by mouth 2 (two) times daily. 09/21/18   Corky Sing, PA-C  docusate sodium (COLACE) 100 MG capsule Take 1 capsule (100 mg total) by mouth 2 (two) times daily. While taking narcotic pain medicine. 09/21/18   Mechele Claude  [place], PA-C  naproxen (NAPROSYN) 500 MG tablet Take 1 tablet (500 mg total) by mouth 2 (two) times daily for 7 days. 12/17/18 12/24/18  Janeece Fitting, PA-C  senna (SENOKOT) 8.6 MG TABS tablet Take 2 tablets (17.2 mg total) by mouth 2 (two) times daily. 09/21/18   Corky Sing, PA-C  sertraline (ZOLOFT) 100 MG tablet Take 100 mg by mouth daily.     Joline Salt, RN    Family History No family history on file.  Social History Social History   Tobacco Use  . Smoking status: Current Every Day  Smoker    Packs/day: 1.00    Years: 15.00    Pack years: 15.00    Types: Cigarettes  . Smokeless tobacco: Never Used  Substance Use Topics  . Alcohol use: Yes    Alcohol/week: 84.0 standard drinks    Types: 84 Cans of beer per week    Comment: in recovery since 02/2018  . Drug use: Not Currently    Frequency: 28.0 times per week    Types: Hydrocodone, Heroin    Comment: heroin quit 5 years     Allergies   Fish allergy, Sulfa antibiotics, and Bee venom   Review of Systems Review of Systems  Constitutional: Negative for fever.  HENT: Negative for sore throat.   Eyes: Negative for photophobia.  Respiratory: Negative for shortness of breath.   Cardiovascular: Negative for chest pain.  Gastrointestinal: Negative for abdominal pain and vomiting.  Genitourinary: Negative for flank pain.  Musculoskeletal: Positive for arthralgias, joint swelling and myalgias.  Neurological: Negative for headaches.     Physical Exam Updated Vital Signs BP 128/87 (BP Location: Right Arm)   Pulse 75   Temp 98.5 F (36.9 C) (Oral)   Resp 16   Ht 5\' 5"  (1.651 m)   Wt 71.2 kg   SpO2 95%   BMI 26.13 kg/m   Physical Exam Vitals signs and nursing note reviewed.  Constitutional:      Appearance: He is well-developed.     Comments: Disheveled with smell of alcohol.  HENT:     Head: Normocephalic and atraumatic.  Eyes:     General: No scleral icterus.    Pupils: Pupils are equal, round, and reactive to light.  Neck:     Musculoskeletal: Normal range of motion.  Cardiovascular:     Heart sounds: Normal heart sounds.  Pulmonary:     Effort: Pulmonary effort is normal.     Breath sounds: Normal breath sounds. No wheezing.  Chest:     Chest wall: No tenderness.  Abdominal:     General: Bowel sounds are normal. There is no distension.     Palpations: Abdomen is soft.     Tenderness: There is no abdominal tenderness.  Musculoskeletal:        General: No deformity.     Left hand: He  exhibits decreased range of motion, tenderness and swelling. Normal sensation noted. Normal strength noted.     Comments: Pulses present, capillary refill is intact.  Significant swelling to the left dorsum aspect of the hand, hematoma present.  Skin:    General: Skin is warm and dry.  Neurological:     Mental Status: He is alert and oriented to person, place, and time.      ED Treatments / Results  Labs (all labs ordered are listed, but only abnormal results are displayed) Labs Reviewed - No data to display  EKG None  Radiology Dg Hand Complete Left  Result Date: 12/17/2018 CLINICAL DATA:  Struck a wall last evening. Pain and swelling along the third through fifth MCP joints. EXAM: LEFT HAND - COMPLETE 3+ VIEW COMPARISON:  None. FINDINGS: The joint spaces are maintained. No acute hand or wrist fracture is identified. Extensive dorsal and ulnar sided soft tissue swelling. IMPRESSION: Extensive soft tissue swelling but no definite fracture. Electronically Signed   By: Marijo Sanes M.D.   On: 12/17/2018 13:37    Procedures Procedures (including critical care time)  Medications Ordered in ED Medications  ibuprofen (ADVIL) tablet 600 mg (600 mg Oral Given 12/17/18 1402)     Initial Impression / Assessment and Plan / ED Course  I have reviewed the triage vital signs and the nursing notes.  Pertinent labs & imaging results that were available during my care of the patient were reviewed by me and considered in my medical decision making (see chart for details).     Patient with a past medical history of alcohol abuse presents to the ED after punching a wall his left close fist.  Reports pain along the dorsum aspect of the hand, swelling noted to the area.  Patient does endorse drinking this morning prior to arrival, was able to self ambulate to the ED for care.  During primary evaluation hand appears swollen, hematoma present along dorsum aspect along with changes in color.  Strength  is 5 out of 5 with hand flexion.  Will obtain x-ray to further evaluate. X-ray of the left hand showed: Extensive soft tissue swelling but no definite fracture.  Will place patient on a Velcro splint, given Tylenol in the ED, will also discharge him with a short prescription of anti-inflammatories.  Rice therapy encouraged, patient is to follow-up with his PCP as needed. Patient understands and agrees with management. Return precautions provided.   Portions of this note were generated with [employer]. Dictation errors may occur despite best attempts at proofreading.    Final Clinical Impressions(s) / ED Diagnoses   Final diagnoses:  Left hand pain    ED Discharge Orders         Ordered    naproxen (NAPROSYN) 500 MG tablet  2 times daily     12/17/18 1400           Janeece Fitting, PA-C 12/17/18 1408    Milton Ferguson, MD 12/21/18 1256

## 2018-12-17 NOTE — ED Triage Notes (Signed)
Pt here for left hand injury. Possible alcohol intoxication. Pt hit a wall last night when he was angry at some friends

## 2018-12-17 NOTE — ED Notes (Signed)
Patient transported to X-ray 

## 2018-12-17 NOTE — ED Notes (Signed)
Patient verbalizes understanding of discharge instructions. Opportunity for questioning and answers were provided. Armband removed by staff, pt discharged from ED ambulatory to home.  

## 2018-12-17 NOTE — Discharge Instructions (Signed)
Your x-ray today showed no fracture or dislocation.  Please apply ice, heat to your hand to help with the swelling.  I have provided a splint, please wear this for comfort.  I have provided a prescription for pain medication, please take this as directed.  If you experience any numbness, worsening symptoms, fevers please return to the emergency department.

## 2018-12-21 ENCOUNTER — Other Ambulatory Visit: Payer: Self-pay

## 2018-12-21 ENCOUNTER — Emergency Department (HOSPITAL_BASED_OUTPATIENT_CLINIC_OR_DEPARTMENT_OTHER): Payer: Self-pay

## 2018-12-21 ENCOUNTER — Emergency Department (HOSPITAL_COMMUNITY)
Admission: EM | Admit: 2018-12-21 | Discharge: 2018-12-21 | Disposition: A | Discharge status: Home / Self Care | Payer: Self-pay | Attending: Emergency Medicine | Admitting: Emergency Medicine

## 2018-12-21 ENCOUNTER — Emergency Department (HOSPITAL_COMMUNITY): Payer: Self-pay

## 2018-12-21 ENCOUNTER — Encounter (HOSPITAL_COMMUNITY): Payer: Self-pay | Admitting: Emergency Medicine

## 2018-12-21 DIAGNOSIS — Y929 Unspecified place or not applicable: Secondary | ICD-10-CM | POA: Insufficient documentation

## 2018-12-21 DIAGNOSIS — R2242 Localized swelling, mass and lump, left lower limb: Secondary | ICD-10-CM | POA: Insufficient documentation

## 2018-12-21 DIAGNOSIS — K7030 Alcoholic cirrhosis of liver without ascites: Secondary | ICD-10-CM | POA: Insufficient documentation

## 2018-12-21 DIAGNOSIS — Z7982 Long term (current) use of aspirin: Secondary | ICD-10-CM | POA: Insufficient documentation

## 2018-12-21 DIAGNOSIS — S8011XA Contusion of right lower leg, initial encounter: Secondary | ICD-10-CM

## 2018-12-21 DIAGNOSIS — Y939 Activity, unspecified: Secondary | ICD-10-CM | POA: Insufficient documentation

## 2018-12-21 DIAGNOSIS — X58XXXA Exposure to other specified factors, initial encounter: Secondary | ICD-10-CM | POA: Insufficient documentation

## 2018-12-21 DIAGNOSIS — Z79899 Other long term (current) drug therapy: Secondary | ICD-10-CM | POA: Insufficient documentation

## 2018-12-21 DIAGNOSIS — F17210 Nicotine dependence, cigarettes, uncomplicated: Secondary | ICD-10-CM | POA: Insufficient documentation

## 2018-12-21 DIAGNOSIS — J45909 Unspecified asthma, uncomplicated: Secondary | ICD-10-CM | POA: Insufficient documentation

## 2018-12-21 DIAGNOSIS — L538 Other specified erythematous conditions: Secondary | ICD-10-CM

## 2018-12-21 DIAGNOSIS — S7012XA Contusion of left thigh, initial encounter: Secondary | ICD-10-CM | POA: Insufficient documentation

## 2018-12-21 DIAGNOSIS — Y999 Unspecified external cause status: Secondary | ICD-10-CM | POA: Insufficient documentation

## 2018-12-21 LAB — COMPREHENSIVE METABOLIC PANEL
ALT: 59 U/L — ABNORMAL HIGH (ref 0–44)
AST: 255 U/L — ABNORMAL HIGH (ref 15–41)
Albumin: 2.5 g/dL — ABNORMAL LOW (ref 3.5–5.0)
Alkaline Phosphatase: 188 U/L — ABNORMAL HIGH (ref 38–126)
Anion gap: 9 (ref 5–15)
BUN: <5 mg/dL — ABNORMAL LOW (ref 6–20)
CO2: 21 mmol/L — ABNORMAL LOW (ref 22–32)
Calcium: 7.9 mg/dL — ABNORMAL LOW (ref 8.9–10.3)
Chloride: 107 mmol/L (ref 98–111)
Creatinine, Ser: 0.51 mg/dL — ABNORMAL LOW (ref 0.61–1.24)
GFR calc Af Amer: >60 mL/min (ref >60)
GFR calc non Af Amer: >60 mL/min (ref >60)
Glucose, Bld: 95 mg/dL (ref 70–99)
Potassium: 4 mmol/L (ref 3.5–5.1)
Sodium: 137 mmol/L (ref 135–145)
Total Bilirubin: 5.1 mg/dL — ABNORMAL HIGH (ref 0.3–1.2)
Total Protein: 7.5 g/dL (ref 6.5–8.1)

## 2018-12-21 LAB — PROTIME-INR
INR: 1.5 — ABNORMAL HIGH (ref 0.8–1.2)
Prothrombin Time: 17.9 seconds — ABNORMAL HIGH (ref 11.4–15.2)

## 2018-12-21 LAB — CBC WITH DIFFERENTIAL/PLATELET
Abs Immature Granulocytes: 0.04 10*3/uL (ref 0.00–0.07)
Basophils Absolute: 0.1 10*3/uL (ref 0.0–0.1)
Basophils Relative: 2 %
Eosinophils Absolute: 0.1 10*3/uL (ref 0.0–0.5)
Eosinophils Relative: 1 %
HCT: 32 % — ABNORMAL LOW (ref 39.0–52.0)
Hemoglobin: 11 g/dL — ABNORMAL LOW (ref 13.0–17.0)
Immature Granulocytes: 1 %
Lymphocytes Relative: 37 %
Lymphs Abs: 2.1 10*3/uL (ref 0.7–4.0)
MCH: 30.1 pg (ref 26.0–34.0)
MCHC: 34.4 g/dL (ref 30.0–36.0)
MCV: 87.7 fL (ref 80.0–100.0)
Monocytes Absolute: 1.2 10*3/uL — ABNORMAL HIGH (ref 0.1–1.0)
Monocytes Relative: 22 %
Neutro Abs: 2 10*3/uL (ref 1.7–7.7)
Neutrophils Relative %: 37 %
Platelets: 36 10*3/uL — ABNORMAL LOW (ref 150–400)
RBC: 3.65 MIL/uL — ABNORMAL LOW (ref 4.22–5.81)
RDW: 21.2 % — ABNORMAL HIGH (ref 11.5–15.5)
WBC: 5.5 10*3/uL (ref 4.0–10.5)
nRBC: 0 % (ref 0.0–0.2)

## 2018-12-21 LAB — CK: Total CK: 623 U/L — ABNORMAL HIGH (ref 49–397)

## 2018-12-21 MED ORDER — TRAMADOL HCL 50 MG PO TABS
50.0000 mg | ORAL_TABLET | Freq: Once | ORAL | Status: AC
  Administered 2018-12-21: 50 mg via ORAL
  Filled 2018-12-21: qty 1

## 2018-12-21 NOTE — ED Provider Notes (Addendum)
[facility] EMERGENCY DEPARTMENT Provider Note   CSN: [id number] Arrival date & time: 12/21/18  1433    History   Chief Complaint Chief Complaint  Patient presents with  . Leg Pain    HPI Antonio [name] is a 48 y.o. male.     Patient awoke this morning with bruising and swelling in his left medial thigh.  No known traumatic event.  Past medical history includes alcoholism in recovery, depression, hepatitis B and C, several others.  Severity is moderate.  Palpation makes symptoms better or worse.     Past Medical History:  Diagnosis Date  . Alcoholic ([place])    in recovery since 02/2018  . Asthma   . Complication of anesthesia    hard to wake up  . Depression 09/17/2018   h/o suicidal ideation, previously homeless.  . Hepatitis    hep b and c  . Polysubstance overdose   . Ruptured spleen     Patient Active Problem List   Diagnosis Date Noted  . Pancreatitis, acute 02/16/2016  . Chest pain 02/13/2016  . 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 Syncope 02/12/2016  . Opioid dependence ([place]) 10/06/2012  . Unspecified episodic mood disorder 10/06/2012  . Alcohol dependence ([place]) 10/06/2012  . Polysubstance abuse ([place]) 02/24/2012  . Chronic back pain 02/24/2012  . Withdrawal from opioids ([place]) 02/24/2012  . Alcohol withdrawal ([place]) 02/24/2012  . 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 Thrombocytopenia ([place]) 02/24/2012  . Neutropenia- low relative neutrophil count 02/24/2012  . Dehydration with hypernatremia 02/24/2012  . Elevated CPK 02/24/2012  . Metabolic encephalopathy 2/2 alcohol and opiods/dehydration 02/24/2012  . Depression 02/24/2012  . Hepatitis B 02/24/2012  . Hepatitis C 02/24/2012  . Alcohol abuse, daily use 06/02/2011    Past Surgical History:  Procedure Laterality Date  . NO PAST SURGERIES     spleen removed 5 yrs ago  . ORIF ANKLE FRACTURE Left 10/06/2013   Procedure: LEFT ANKLE FRACTURE OPEN TREATMENT  BILMALLEOLAR ANKLE INCLUDES INTERNAL FIXATION, LEFT ANKLE FRACTURE OPEN TREATMENT DISTAL TIBIOFIBULAR INCLUDES INTERNAL FIXATION ;  Surgeon: Renette Butters, MD;  Location: [facility];  Service: Orthopedics;  Laterality: Left;  . ORIF ANKLE FRACTURE Right 09/21/2018   Procedure: Open reduction internal fixation right trimalleolar ankle fracture;  Surgeon: Wylene Simmer, MD;  Location: [facility];  Service: Orthopedics;  Laterality: Right;  36min  ok per OR desk to follow in Room 5  . spllenectomy          Home Medications    Prior to Admission medications   Medication Sig Start Date End Date Taking? Authorizing Provider  aspirin EC 81 MG tablet Take 1 tablet (81 mg total) by mouth 2 (two) times daily. 09/21/18   Corky Sing, PA-C  docusate sodium (COLACE) 100 MG capsule Take 1 capsule (100 mg total) by mouth 2 (two) times daily. While taking narcotic pain medicine. 09/21/18   Corky Sing, PA-C  naproxen (NAPROSYN) 500 MG tablet Take 1 tablet (500 mg total) by mouth 2 (two) times daily for 7 days. 12/17/18 12/24/18  Janeece Fitting, PA-C  senna (SENOKOT) 8.6 MG TABS tablet Take 2 tablets (17.2 mg total) by mouth 2 (two) times daily. 09/21/18   Corky Sing, PA-C  sertraline (ZOLOFT) 100 MG tablet Take 100 mg by mouth daily.     Joline Salt, RN    Family History History reviewed. No pertinent family history.  Social History  Social History   Tobacco Use  . Smoking status: Current Every Day Smoker    Packs/day: 1.00    Years: 15.00    Pack years: 15.00    Types: Cigarettes  . Smokeless tobacco: Never Used  Substance Use Topics  . Alcohol use: Yes    Alcohol/week: 84.0 standard drinks    Types: 84 Cans of beer per week    Comment: in recovery since 02/2018  . Drug use: Not Currently    Frequency: 28.0 times per week    Types: Hydrocodone, Heroin    Comment: heroin quit 5 years     Allergies   Fish allergy, Sulfa antibiotics, and  Bee venom   Review of Systems Review of Systems  All other systems reviewed and are negative.    Physical Exam Updated Vital Signs BP (!) 141/103   Pulse 62   Temp 98.2 F (36.8 C) (Oral)   Resp 17   Wt 70.8 kg   SpO2 98%   BMI 25.96 kg/m   Physical Exam Vitals signs and nursing note reviewed.  Constitutional:      Appearance: He is well-developed.  HENT:     Head: Normocephalic and atraumatic.  Eyes:     Conjunctiva/sclera: Conjunctivae normal.  Neck:     Musculoskeletal: Neck supple.  Cardiovascular:     Rate and Rhythm: Normal rate and regular rhythm.  Pulmonary:     Effort: Pulmonary effort is normal.     Breath sounds: Normal breath sounds.  Abdominal:     General: Bowel sounds are normal.     Palpations: Abdomen is soft.  Musculoskeletal:     Comments: Left lower extremity: Extensive ecchymosis medial aspect of the left thigh with edema.  No compartment syndrome.  Skin:    General: Skin is warm and dry.  Neurological:     Mental Status: He is alert and oriented to person, place, and time.  Psychiatric:        Behavior: Behavior normal.      ED Treatments / Results  Labs (all labs ordered are listed, but only abnormal results are displayed) Labs Reviewed  PROTIME-INR - Abnormal; Notable for the following components:      Result Value   Prothrombin Time 17.9 (*)    INR 1.5 (*)    All other components within normal limits  CBC WITH DIFFERENTIAL/PLATELET  COMPREHENSIVE METABOLIC PANEL  CK    EKG None  Radiology Dg Femur Min 2 Views Left  Result Date: 12/21/2018 CLINICAL DATA:  Ecchymosis and pain in the medial left thigh since this morning. EXAM: LEFT FEMUR 2 VIEWS COMPARISON:  None. FINDINGS: There is no evidence of fracture or other focal bone lesions. Soft tissues are unremarkable. IMPRESSION: Negative. Electronically Signed   By: Logan Bores M.D.   On: 12/21/2018 18:08    Procedures Procedures (including critical care time)   Medications Ordered in ED Medications - No data to display   Initial Impression / Assessment and Plan / ED Course  I have reviewed the triage vital signs and the nursing notes.  Pertinent labs & imaging results that were available during my care of the patient were reviewed by me and considered in my medical decision making (see chart for details).   Patient presents with ecchymosis of his left medial thigh.  Suspect traumatic injury, but patient denies.  Plain films left femur negative.  Doppler study per verbal report from tech.  Labs pending.  Suspect traumatic ecchymosis.  Final Clinical Impressions(s) / ED Diagnoses   Final diagnoses:  Traumatic ecchymosis of multiple sites of lower extremity, right, initial encounter    ED Discharge Orders    None       Nat Christen, MD 12/21/18 1821    Nat Christen, MD 12/21/18 [phone]

## 2018-12-21 NOTE — ED Notes (Signed)
Pt transported to Xray. 

## 2018-12-21 NOTE — ED Triage Notes (Signed)
Pt states he woke up with a large bruise on left upper thigh.Bruise is deep purple. Denies any injury. It was not present when he went to bed.Site is swollen and painful.

## 2018-12-21 NOTE — ED Provider Notes (Signed)
Received patient at signout from Dr. Lacinda Axon.  Refer to provider note for full history and physical examination.  Briefly, patient is a 48 year old male with history of alcohol abuse, hepatitis B and C presenting for evaluation of ecchymosis to the left thigh.  He denies any history of trauma.  DVT study is negative and radiographs are also negative for any acute abnormality.  He is pending lab work, likely stable for discharge home after this.  Physical Exam  BP (!) 141/103   Pulse 62   Temp 98.2 F (36.8 C) (Oral)   Resp 17   Wt 70.8 kg   SpO2 98%   BMI 25.96 kg/m   Physical Exam Vitals signs and nursing note reviewed.  Constitutional:      General: He is not in acute distress.    Appearance: He is well-developed.  HENT:     Head: Normocephalic and atraumatic.  Eyes:     General:        Right eye: No discharge.        Left eye: No discharge.     Conjunctiva/sclera: Conjunctivae normal.  Neck:     Vascular: No JVD.     Trachea: No tracheal deviation.  Cardiovascular:     Rate and Rhythm: Normal rate.     Pulses: Normal pulses.     Comments: Homans sign absent bilaterally, compartments are soft Pulmonary:     Effort: Pulmonary effort is normal.  Abdominal:     General: Bowel sounds are normal. There is no distension.     Palpations: Abdomen is soft.     Tenderness: There is no abdominal tenderness. There is no guarding or rebound. Negative signs include Murphy's sign.  Skin:    General: Skin is warm and dry.     Findings: Bruising present. No erythema.     Comments: Large area of ecchymosis to the medial left thigh  Neurological:     Mental Status: He is alert.  Psychiatric:        Behavior: Behavior normal.     ED Course/Procedures     Procedures  MDM  Bloodwork reviewed by myself.  Patient is thrombocytopenic, LFTs are elevated.  The only labs I have to compare to her from 2 years ago.  He appears chronically thrombocytopenic.  His LFTs are worsening from 2 years  ago however it is unclear the chronicity of this.  I have reassessed the patient who is resting comfortably in no apparent distress.  He denies any abdominal pain, nausea, vomiting, melena, hematochezia, or hematemesis.  No headaches.  He tells me he drinks about a 12 pack of beer daily.  8:07 PM Spoke with Dr. Oletta Lamas with gastroenterology in consultation and we discussed the patient's history and blood work.  Most consistent with alcoholic cirrhosis.  There is no evidence of an acute intracranial bleed or GI bleed today and he feels that the patient would be stable for discharge with follow-up with a gastroenterologist or PCP for reevaluation.  The patient and I had a long frank discussion regarding his alcohol abuse and the importance of alcohol cessation.  I also spoke with the patient's power of attorney [name] on the phone and discussed the findings with patient's permission.  She is requesting resources for rehab for the patient.  I have put in a consultation for our peer support specialist and we will also give the patient resources for residential and outpatient substance abuse follow-up.  Discussed strict ED return precautions.  Patient and  his power of attorney verbalized understanding of and agreement with plan and patient is stable for discharge home at this time.    Debroah Baller 12/23/18 1330    Nat Christen, MD 12/28/18 [phone]

## 2018-12-21 NOTE — Progress Notes (Signed)
Left upper extremity venous duplex completed. Refer to "CV Proc" under chart review to view preliminary results.  12/21/2018 6:41 PM Maudry Mayhew, MHA, RVT, RDCS, RDMS

## 2018-12-21 NOTE — Discharge Instructions (Signed)
Apply ice or heat, whichever feels best, to the area of swelling.  You can do this 20 minutes at a time 3-4 times daily.  Your labs today suggested that you have liver disease which is likely related to your alcohol abuse.  It will be important to stop drinking but you will likely have to do this slowly, decreasing alcohol a little bit at a time until you are no longer drinking.  I have consulted our peer support specialist who will reach out to you to discuss quitting drinking.  I have also attached resources for follow-up at a rehab facility both outpatient and residential.  Follow-up with your primary care provider or gastroenterologist for reevaluation of your liver disease.  Return to the emergency department if any concerning signs or symptoms develop such as persistent vomiting, high fevers, severe pain, severe swelling of the abdomen.

## 2018-12-28 ENCOUNTER — Inpatient Hospital Stay (HOSPITAL_COMMUNITY)
Admission: EM | Admit: 2018-12-28 | Discharge: 2019-01-09 | DRG: 605 | Disposition: A | Discharge status: Home Health | Payer: Self-pay | Attending: Internal Medicine | Admitting: Internal Medicine

## 2018-12-28 ENCOUNTER — Other Ambulatory Visit: Payer: Self-pay

## 2018-12-28 ENCOUNTER — Encounter (HOSPITAL_COMMUNITY): Payer: Self-pay | Admitting: Emergency Medicine

## 2018-12-28 ENCOUNTER — Emergency Department (HOSPITAL_COMMUNITY): Payer: Self-pay

## 2018-12-28 DIAGNOSIS — D696 Thrombocytopenia, unspecified: Secondary | ICD-10-CM | POA: Diagnosis present

## 2018-12-28 DIAGNOSIS — F17210 Nicotine dependence, cigarettes, uncomplicated: Secondary | ICD-10-CM | POA: Diagnosis present

## 2018-12-28 DIAGNOSIS — S7012XA Contusion of left thigh, initial encounter: Principal | ICD-10-CM | POA: Diagnosis present

## 2018-12-28 DIAGNOSIS — R791 Abnormal coagulation profile: Secondary | ICD-10-CM

## 2018-12-28 DIAGNOSIS — Z91040 Latex allergy status: Secondary | ICD-10-CM

## 2018-12-28 DIAGNOSIS — K7290 Hepatic failure, unspecified without coma: Secondary | ICD-10-CM

## 2018-12-28 DIAGNOSIS — F329 Major depressive disorder, single episode, unspecified: Secondary | ICD-10-CM | POA: Diagnosis present

## 2018-12-28 DIAGNOSIS — B182 Chronic viral hepatitis C: Secondary | ICD-10-CM

## 2018-12-28 DIAGNOSIS — Z720 Tobacco use: Secondary | ICD-10-CM

## 2018-12-28 DIAGNOSIS — D62 Acute posthemorrhagic anemia: Secondary | ICD-10-CM | POA: Diagnosis present

## 2018-12-28 DIAGNOSIS — K7210 Chronic hepatic failure without coma: Secondary | ICD-10-CM

## 2018-12-28 DIAGNOSIS — Z79899 Other long term (current) drug therapy: Secondary | ICD-10-CM

## 2018-12-28 DIAGNOSIS — S8012XD Contusion of left lower leg, subsequent encounter: Secondary | ICD-10-CM

## 2018-12-28 DIAGNOSIS — J45909 Unspecified asthma, uncomplicated: Secondary | ICD-10-CM | POA: Diagnosis present

## 2018-12-28 DIAGNOSIS — R443 Hallucinations, unspecified: Secondary | ICD-10-CM | POA: Diagnosis present

## 2018-12-28 DIAGNOSIS — F10939 Alcohol use, unspecified with withdrawal, unspecified: Secondary | ICD-10-CM | POA: Diagnosis present

## 2018-12-28 DIAGNOSIS — B1920 Unspecified viral hepatitis C without hepatic coma: Secondary | ICD-10-CM | POA: Diagnosis present

## 2018-12-28 DIAGNOSIS — D689 Coagulation defect, unspecified: Secondary | ICD-10-CM | POA: Diagnosis present

## 2018-12-28 DIAGNOSIS — Z7982 Long term (current) use of aspirin: Secondary | ICD-10-CM

## 2018-12-28 DIAGNOSIS — F1010 Alcohol abuse, uncomplicated: Secondary | ICD-10-CM

## 2018-12-28 DIAGNOSIS — Z882 Allergy status to sulfonamides status: Secondary | ICD-10-CM

## 2018-12-28 DIAGNOSIS — Z03818 Encounter for observation for suspected exposure to other biological agents ruled out: Secondary | ICD-10-CM

## 2018-12-28 DIAGNOSIS — Z91030 Bee allergy status: Secondary | ICD-10-CM

## 2018-12-28 DIAGNOSIS — K7030 Alcoholic cirrhosis of liver without ascites: Secondary | ICD-10-CM | POA: Diagnosis present

## 2018-12-28 DIAGNOSIS — K7040 Alcoholic hepatic failure without coma: Secondary | ICD-10-CM | POA: Diagnosis present

## 2018-12-28 DIAGNOSIS — Z811 Family history of alcohol abuse and dependence: Secondary | ICD-10-CM

## 2018-12-28 DIAGNOSIS — Z79891 Long term (current) use of opiate analgesic: Secondary | ICD-10-CM

## 2018-12-28 DIAGNOSIS — F10231 Alcohol dependence with withdrawal delirium: Secondary | ICD-10-CM | POA: Diagnosis present

## 2018-12-28 DIAGNOSIS — E871 Hypo-osmolality and hyponatremia: Secondary | ICD-10-CM | POA: Diagnosis present

## 2018-12-28 DIAGNOSIS — F10239 Alcohol dependence with withdrawal, unspecified: Secondary | ICD-10-CM

## 2018-12-28 DIAGNOSIS — Z91013 Allergy to seafood: Secondary | ICD-10-CM

## 2018-12-28 DIAGNOSIS — T148XXA Other injury of unspecified body region, initial encounter: Secondary | ICD-10-CM | POA: Diagnosis present

## 2018-12-28 DIAGNOSIS — B1910 Unspecified viral hepatitis B without hepatic coma: Secondary | ICD-10-CM | POA: Diagnosis present

## 2018-12-28 LAB — COMPREHENSIVE METABOLIC PANEL
ALT: 40 U/L (ref 0–44)
AST: 150 U/L — ABNORMAL HIGH (ref 15–41)
Albumin: 2 g/dL — ABNORMAL LOW (ref 3.5–5.0)
Alkaline Phosphatase: 199 U/L — ABNORMAL HIGH (ref 38–126)
Anion gap: 7 (ref 5–15)
BUN: 8 mg/dL (ref 6–20)
CO2: 20 mmol/L — ABNORMAL LOW (ref 22–32)
Calcium: 7.7 mg/dL — ABNORMAL LOW (ref 8.9–10.3)
Chloride: 105 mmol/L (ref 98–111)
Creatinine, Ser: 0.43 mg/dL — ABNORMAL LOW (ref 0.61–1.24)
GFR calc Af Amer: >60 mL/min (ref >60)
GFR calc non Af Amer: >60 mL/min (ref >60)
Glucose, Bld: 101 mg/dL — ABNORMAL HIGH (ref 70–99)
Potassium: 3.9 mmol/L (ref 3.5–5.1)
Sodium: 132 mmol/L — ABNORMAL LOW (ref 135–145)
Total Bilirubin: 8.2 mg/dL — ABNORMAL HIGH (ref 0.3–1.2)
Total Protein: 6.9 g/dL (ref 6.5–8.1)

## 2018-12-28 LAB — SARS CORONAVIRUS 2 BY RT PCR (HOSPITAL ORDER, PERFORMED IN ~~LOC~~ HOSPITAL LAB): SARS Coronavirus 2: NEGATIVE

## 2018-12-28 LAB — CBC WITH DIFFERENTIAL/PLATELET
Abs Immature Granulocytes: 0.33 10*3/uL — ABNORMAL HIGH (ref 0.00–0.07)
Basophils Absolute: 0.1 10*3/uL (ref 0.0–0.1)
Basophils Relative: 1 %
Eosinophils Absolute: 0.3 10*3/uL (ref 0.0–0.5)
Eosinophils Relative: 4 %
HCT: 25.3 % — ABNORMAL LOW (ref 39.0–52.0)
Hemoglobin: 8.5 g/dL — ABNORMAL LOW (ref 13.0–17.0)
Immature Granulocytes: 4 %
Lymphocytes Relative: 27 %
Lymphs Abs: 2.2 10*3/uL (ref 0.7–4.0)
MCH: 31.5 pg (ref 26.0–34.0)
MCHC: 33.6 g/dL (ref 30.0–36.0)
MCV: 93.7 fL (ref 80.0–100.0)
Monocytes Absolute: 2.6 10*3/uL — ABNORMAL HIGH (ref 0.1–1.0)
Monocytes Relative: 32 %
Neutro Abs: 2.7 10*3/uL (ref 1.7–7.7)
Neutrophils Relative %: 32 %
Platelets: 74 10*3/uL — ABNORMAL LOW (ref 150–400)
RBC: 2.7 MIL/uL — ABNORMAL LOW (ref 4.22–5.81)
RDW: 24.5 % — ABNORMAL HIGH (ref 11.5–15.5)
WBC: 8.2 10*3/uL (ref 4.0–10.5)
nRBC: 2.2 % — ABNORMAL HIGH (ref 0.0–0.2)

## 2018-12-28 LAB — RAPID URINE DRUG SCREEN, HOSP PERFORMED
Amphetamines: NOT DETECTED
Barbiturates: NOT DETECTED
Benzodiazepines: NOT DETECTED
Cocaine: NOT DETECTED
Opiates: NOT DETECTED
Tetrahydrocannabinol: NOT DETECTED

## 2018-12-28 LAB — PROTIME-INR
INR: 1.5 — ABNORMAL HIGH (ref 0.8–1.2)
Prothrombin Time: 18.1 seconds — ABNORMAL HIGH (ref 11.4–15.2)

## 2018-12-28 LAB — ABO/RH: ABO/RH(D): O POS

## 2018-12-28 LAB — ETHANOL: Alcohol, Ethyl (B): <10 mg/dL (ref <10)

## 2018-12-28 LAB — TYPE AND SCREEN
ABO/RH(D): O POS
Antibody Screen: NEGATIVE

## 2018-12-28 LAB — AMMONIA: Ammonia: 63 umol/L — ABNORMAL HIGH (ref 9–35)

## 2018-12-28 MED ORDER — IOHEXOL 300 MG/ML  SOLN
100.0000 mL | Freq: Once | INTRAMUSCULAR | Status: AC | PRN
  Administered 2018-12-28: 100 mL via INTRAVENOUS

## 2018-12-28 NOTE — H&P (Signed)
Antonio [name] GBE:[id number] DOB: August 18, 1970 DOA: 12/28/2018     PCP: Patient, No Pcp Per   Outpatient Specialists:   NONE    Patient arrived to ER on 12/28/18 at 34  Patient coming from: home Lives  With friend    Chief Complaint:  Chief Complaint  Patient presents with  . Leg Pain    HPI: Antonio [name] is a 48 y.o. 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 Patient is supposed to be on aspirin 81 mg   H he has been drinking up until today Denies history of seizures but reports history of severe DTs. Currently feeling tremulous. States he is interested in quitting drinking. States his brother have died from alcohol abuse was  States he is quitting smoking as well Infectious risk factors:  Reports none In  ER RAPID COVID TEST NEGATIVE    Regarding pertinent Chronic problems:    While in ER:  The following Work up has been ordered so far:  Orders Placed This Encounter  Procedures  . SARS Coronavirus 2 (CEPHEID- Performed in [place] hospital lab), [facility]  . CT EXTREMITY LOWER LEFT W CONTRAST  . Comprehensive metabolic panel  . CBC with Differential  . Protime-INR  . Ammonia  . Ethanol  . Urine rapid drug screen (hosp performed)  . CBC  . Protime-INR  . [facility] Withdrawal Assessment (CIWA)  . Notify Pharmacy to change IV Ativan/Valium to PO if tolerating POs well.  . D/C all previous orders for benzodiazepines  . Cardiac monitoring  . PRN  lorazepam dose > 20 mg in 24 hours  . PRN diazepam dose > 75 mg in 24 hours  . CIWA-Ar score > 20, or > 10 point increase in score (consider critical care consult)  . Intention to medicate with CIWA-Ar score < 10  . Patient confused, agitated  . Patient has seizure  . Heart rate > 120 bpm  . SBP > 180 mmHg, or DBP > 120 mmHg  . Cardiac monitoring  . Apply ace wrap  . Consult for [place] Admission  ALL PATIENTS BEING ADMITTED/HAVING PROCEDURES NEED COVID-19 SCREENING  . Consult to orthopedic surgery  ALL PATIENTS BEING ADMITTED/HAVING PROCEDURES NEED COVID-19 SCREENING  . EKG 12-Lead  . Type and screen  . ABO/Rh  . Place in observation (patient's expected length of stay will be less than 2 midnights)      Following Medications were ordered in ER: Medications  sodium chloride flush (NS) 0.9 % injection 3 mL (has no administration in time range)  sodium chloride flush (NS) 0.9 % injection 3 mL (has no administration in time range)  0.9 %  sodium chloride infusion (has no administration in time range)  LORazepam (ATIVAN) injection 2-3 mg (has no administration in time range)  folic acid (FOLVITE) tablet 1 mg (has no administration in time range)  thiamine (VITAMIN B-1) tablet 100 mg (has no administration in time range)  iohexol (OMNIPAQUE) 300 MG/ML  solution 100 mL (100 mLs Intravenous Contrast Given 12/28/18 2107)       ER Provider Called:    Orthopedics Dr Marcelino Scot  They Recommend admit to medicine by gentle Ace bandage reconsult orthopedics if needed     Significant initial  Findings: Abnormal Labs Reviewed  COMPREHENSIVE METABOLIC PANEL - Abnormal; Notable for the following components:      Result Value   Sodium 132 (*)    CO2 20 (*)    Glucose, Bld 101 (*)    Creatinine, Ser 0.43 (*)    Calcium 7.7 (*)    Albumin 2.0 (*)    AST 150 (*)    Alkaline Phosphatase 199 (*)    Total Bilirubin 8.2 (*)    All other components within normal limits  CBC WITH  DIFFERENTIAL/PLATELET - Abnormal; Notable for the following components:   RBC 2.70 (*)    Hemoglobin 8.5 (*)    HCT 25.3 (*)    RDW 24.5 (*)    Platelets 74 (*)    nRBC 2.2 (*)    Monocytes Absolute 2.6 (*)    Abs Immature Granulocytes 0.33 (*)    All other components within normal limits  PROTIME-INR - Abnormal; Notable for the following components:   Prothrombin Time 18.1 (*)    INR 1.5 (*)    All other components within normal limits  AMMONIA - Abnormal; Notable for the following components:   Ammonia 63 (*)    All other components within normal limits     Otherwise labs showing:    Recent Labs  Lab 12/28/18 1822  NA 132*  K 3.9  CO2 20*  GLUCOSE 101*  BUN 8  CREATININE 0.43*  CALCIUM 7.7*   Cr    stable,    Lab Results  Component Value Date   CREATININE 0.43 (L) 12/28/2018   CREATININE 0.51 (L) 12/21/2018   CREATININE 0.58 (L) 02/18/2016    Recent Labs  Lab 12/28/18 1822  AST 150*  ALT 40  ALKPHOS 199*  BILITOT 8.2*  PROT 6.9  ALBUMIN 2.0*   Lab Results  Component Value Date   CALCIUM 7.7 (L) 12/28/2018      WBC      Component Value Date/Time   WBC 8.2 12/28/2018 1822   ANC    Component Value Date/Time   NEUTROABS 2.7 12/28/2018 1822   ALC No results found for: LYMPHOABS    Plt: Lab Results  Component Value Date   PLT 74 (L) 12/28/2018     Lactic Acid, Venous    Component Value Date/Time   LATICACIDVEN 1.9 02/24/2012 0009     COVID-19 Labs    Lab Results  Component Value Date   SARSCOV2NAA NEGATIVE 12/28/2018     HG/HCT  Down      Component Value Date/Time   HGB 8.5 (L) 12/28/2018 1822   HCT 25.3 (L) 12/28/2018 1822     CBG (last 3)  No results for input(s): GLUCAP in the last 72 hours.     UA  not ordered    ECG: none ordered      ED Triage Vitals  Enc Vitals Group     BP 12/28/18 1609 133/86     Pulse Rate 12/28/18 1609 85     Resp 12/28/18 1609 17     Temp 12/28/18 1609 98.4 F (36.9 C)     Temp  Source 12/28/18 1609 Oral     SpO2 12/28/18 1609 98 %  Weight 12/28/18 1610 158 lb (71.7 kg)     Height 12/28/18 1610 5\' 7"  (1.702 m)     Head Circumference --      Peak Flow --      Pain Score 12/28/18 1609 10     Pain Loc --      Pain Edu? --      Excl. in [place]? --   TMAX(24)@       Latest  Blood pressure (!) 131/91, pulse 77, temperature 98.4 F (36.9 C), temperature source Oral, resp. rate 16, height 5\' 7"  (1.702 m), weight 71.7 kg, SpO2 97 %.     Hospitalist was called for admission for spontaneous hematoma of left leg   Review of Systems:    Pertinent positives include:  fatigue,   Constitutional:  No weight loss, night sweats, Fevers, chills,weight loss  HEENT:  No headaches, Difficulty swallowing,Tooth/dental problems,Sore throat,  No sneezing, itching, ear ache, nasal congestion, post nasal drip,  Cardio-vascular:  No chest pain, Orthopnea, PND, anasarca, dizziness, palpitations.no Bilateral lower extremity swelling  GI:  No heartburn, indigestion, abdominal pain, nausea, vomiting, diarrhea, change in bowel habits, loss of appetite, melena, blood in stool, hematemesis Resp:  no shortness of breath at rest. No dyspnea on exertion, No excess mucus, no productive cough, No non-productive cough, No coughing up of blood.No change in color of mucus.No wheezing. Skin:  no rash or lesions. No jaundice GU:  no dysuria, change in color of urine, no urgency or frequency. No straining to urinate.  No flank pain.  Musculoskeletal:  No joint pain or no joint swelling. No decreased range of motion. No back pain.  Psych:  No change in mood or affect. No depression or anxiety. No memory loss.  Neuro: no localizing neurological complaints, no tingling, no weakness, no double vision, no gait abnormality, no slurred speech, no confusion  All systems reviewed and apart from [place] all are negative  Past Medical History:   Past Medical History:  Diagnosis Date  . Alcoholic  ([employer])    in recovery since 02/2018  . Asthma   . Complication of anesthesia    hard to wake up  . Depression 09/17/2018   h/o suicidal ideation, previously homeless.  . Hepatitis    hep b and c  . Polysubstance overdose   . Ruptured spleen       Past Surgical History:  Procedure Laterality Date  . NO PAST SURGERIES     spleen removed 5 yrs ago  . ORIF ANKLE FRACTURE Left 10/06/2013   Procedure: LEFT ANKLE FRACTURE OPEN TREATMENT BILMALLEOLAR ANKLE INCLUDES INTERNAL FIXATION, LEFT ANKLE FRACTURE OPEN TREATMENT DISTAL TIBIOFIBULAR INCLUDES INTERNAL FIXATION ;  Surgeon: Renette Butters, MD;  Location: [facility];  Service: Orthopedics;  Laterality: Left;  . ORIF ANKLE FRACTURE Right 09/21/2018   Procedure: Open reduction internal fixation right trimalleolar ankle fracture;  Surgeon: Wylene Simmer, MD;  Location: [facility];  Service: Orthopedics;  Laterality: Right;  53min  ok per OR desk to follow in Room 5  . spllenectomy      Social History:  Ambulatory  Independently      reports that he has been smoking cigarettes. He has a 15.00 pack-year smoking history. He has never used smokeless tobacco. He reports current alcohol use of about 84.0 standard drinks of alcohol per week. He reports previous drug use. Frequency: 28.00 times per week. Drugs: Hydrocodone and Heroin.     Family History:   Family  History  Problem Relation Age of Onset  . Hepatitis Mother   . Alcohol abuse Brother     Allergies: Allergies  Allergen Reactions  . Fish Allergy Itching and Swelling  . Sulfa Antibiotics Swelling  . Bee Venom Swelling  . Latex      Prior to Admission medications   Medication Sig Start Date End Date Taking? Authorizing Provider  aspirin EC 81 MG tablet Take 1 tablet (81 mg total) by mouth 2 (two) times daily. 09/21/18   Corky Sing, PA-C  docusate sodium (COLACE) 100 MG capsule Take 1 capsule (100 mg total) by mouth 2 (two) times  daily. While taking narcotic pain medicine. 09/21/18   Corky Sing, PA-C  senna (SENOKOT) 8.6 MG TABS tablet Take 2 tablets (17.2 mg total) by mouth 2 (two) times daily. 09/21/18   Corky Sing, PA-C  sertraline (ZOLOFT) 100 MG tablet Take 100 mg by mouth daily.     Joline Salt, RN   Physical Exam: Blood pressure (!) 131/91, pulse 77, temperature 98.4 F (36.9 C), temperature source Oral, resp. rate 16, height 5\' 7"  (1.702 m), weight 71.7 kg, SpO2 97 %. 1. General:  in No  Acute distress    Chronically ill  -appearing 2. Psychological: Alert and  Oriented 3. Head/ENT:     Dry Mucous Membranes                          Head Non traumatic, neck supple                           Poor Dentition 4. SKIN: decreased Skin turgor,  Skin clean Dry and intact no rash, icteric Large hematoma of left leg present 5. Heart: Regular rate and rhythm no  Murmur, no Rub or gallop 6. Lungs: Clear to auscultation bilaterally, no wheezes or crackles   7. Abdomen: Soft,  non-tender, distended  bowel sounds present 8. Lower extremities: no clubbing, cyanosis, no  edema 9. Neurologically Grossly intact, moving all 4 extremities equally   tremolous 10. MSK: Normal range of motion   All other LABS:     Recent Labs  Lab 12/28/18 1822  WBC 8.2  NEUTROABS 2.7  HGB 8.5*  HCT 25.3*  MCV 93.7  PLT 74*     Recent Labs  Lab 12/28/18 1822  NA 132*  K 3.9  CL 105  CO2 20*  GLUCOSE 101*  BUN 8  CREATININE 0.43*  CALCIUM 7.7*     Recent Labs  Lab 12/28/18 1822  AST 150*  ALT 40  ALKPHOS 199*  BILITOT 8.2*  PROT 6.9  ALBUMIN 2.0*       Cultures:    Component Value Date/Time   SDES URINE, RANDOM 05/24/2009 0134   SPECREQUEST NONE 05/24/2009 0134   CULT NO GROWTH 05/24/2009 0134   REPTSTATUS 05/25/2009 FINAL 05/24/2009 0134     Radiological Exams on Admission: Ct Extremity Lower Left W Contrast  Result Date: 12/28/2018 CLINICAL DATA:  Soft tissue hematoma. EXAM: CT OF THE  LOWER LEFT EXTREMITY WITH CONTRAST TECHNIQUE: Multidetector CT imaging of the lower left extremity was performed according to the standard protocol following intravenous contrast administration. COMPARISON:  None. CONTRAST:  112mL OMNIPAQUE IOHEXOL 300 MG/ML  SOLN FINDINGS: Bones/Joint/Cartilage No acute displaced fracture. Ligaments Suboptimally assessed by CT. Muscles and Tendons There is a 7 x 7.4 x 13.6 cm soft tissue mass there appears  to be centered in the adductor musculature of the proximal left lower extremity. The margins of this mass are not well defined. There is no CT evidence of active extravasation within this mass. Soft tissues There is soft tissue swelling about the lower extremity. There is a small suprapatellar joint effusion. There is no specific vascular abnormality, however evaluation is limited by contrast bolus timing. There is a trace amount of free fluid in the patient's pelvis. IMPRESSION: 1. Large masslike area centered within the adductor muscles of the left lower extremity could represent a hematoma in the appropriate clinical setting. There is no evidence for active extravasation. An underlying mass is difficult to exclude. If symptoms do not resolve, short interval follow-up with a contrast-enhanced outpatient MRI is recommended for further evaluation. 2. Soft tissue swelling about the lower extremity. 3. Straight amount of free fluid in the patient's pelvis of unknown clinical significance. 4. Small left-sided suprapatellar joint effusion. Electronically Signed   By: Constance Holster M.D.   On: 12/28/2018 21:26    Chart has been reviewed    Assessment/Plan  48 y.o. 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 Thigh hematoma, left, initial encounter -spontaneous hematoma in the setting of  thrombocytopenia and elevated INR secondary to end-stage liver disease secondary to alcohol abuse. CT scan showing no active bleeding will continue to follow CBC Orthopedics was made aware by ER provider . Alcohol abuse/ . Alcohol withdrawal (HCC)-recurrent progressive care order CIWA protocol monitor for signs of severe withdrawal . Thrombocytopenia ([place]) -most likely secondary to cirrhosis obtain type and screen transfuse as needed  . Hepatitis C -order viral load if patient stops drinking would benefit from GI follow-up t . Hyponatremia in the setting of cirrhosis obtain urine electrolytes  . Alcoholic cirrhosis of liver (HCC) only advice regarding quitting alcohol patient this point interested will order social work consult    Tobacco abuse -  - Spoke about importance of quitting spent 5 minutes discussing options for treatment, prior attempts at quitting, and dangers of smoking  -At this point patient is   interested in quitting  - order nicotine patch   - nursing tobacco cessation protocol  Other plan as per orders.  DVT prophylaxis:  SCD    Code Status:  FULL CODE   as per patient   I had personally discussed CODE STATUS with patient   Family Communication:   Family not at  Bedside     Disposition Plan:     To home once workup is complete and patient is stable                                  Social Work  consulted                                       Consults called: Reconsult orthopedics if bleeding progresses, otherwise will need to follow-up with them in a few weeks to see if possible to drain  Admission status:  ED Disposition    ED Disposition Condition [place]: [place] [[id number]]  Level of Care: Progressive [102]  I expect the patient will  be discharged within 24 hours: No (not a candidate for 5C-Observation unit)  Covid Evaluation: Asymptomatic Screening Protocol (No Symptoms)  Diagnosis: Hematoma [[id number]]  Admitting  Physician: Toy Baker [[id number]]  Attending Physician: Toy Baker [[id number]]  PT Class (Do Not Modify): Observation [104]  PT Acc Code (Do Not Modify): Observation [10022]         Obs    Level of care       SDU tele indefinitely please discontinue once patient no longer qualifies  Precautions:  NONE   No active isolations  PPE: Used by the provider:   P100  eye Goggles,  Gloves      Aqueelah Cotrell 12/29/2018, 1:26 AM    [facility]     after 2 AM please page floor coverage PA If 7AM-7PM, please contact the day team taking care of the patient using [email or web address]

## 2018-12-28 NOTE — ED Provider Notes (Signed)
[place] EMERGENCY DEPARTMENT Provider Note   CSN: [id number] Arrival date & time: 12/28/18  1601    History   Chief Complaint Chief Complaint  Patient presents with  . Leg Pain    HPI Antonio [name] is a 48 y.o. male who presents emergency department chief complaint of left leg pain.  He has a past medical history alcohol abuse patient still drinks daily.  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  He was seen 1 week ago with spontaneous hematoma of the left thigh.  The patient had a 2 view femur x-ray and a vascular ultrasound of the left leg to rule out DVT both were negative.  He returns today due to severe pain and worsening of the hematoma of his leg.  He denies any involvement of the genitals or difficulty with urination.  He has had no improvement in his symptoms.  He denies lightheadedness, melena, hematochezia or weakness.     The history is provided by the patient and medical records.    Past Medical History:  Diagnosis Date  . Alcoholic ([place])    in recovery since 02/2018  . Asthma   . Complication of anesthesia    hard to wake up  . Depression 09/17/2018   h/o suicidal ideation, previously homeless.  . Hepatitis    hep b and c  . Polysubstance overdose   . Ruptured spleen     Patient Active Problem List   Diagnosis Date Noted  . Acute blood loss anemia 12/29/2018  . Hyponatremia 12/28/2018  . Thigh hematoma, left, initial encounter 12/28/2018  . Alcoholic cirrhosis of liver ([place]) 12/28/2018  . Hematoma 12/28/2018  . Pancreatitis, acute 02/16/2016  . Chest pain 02/13/2016  . Alcoholic gastritis 92/33/0076  . 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 Syncope 02/12/2016  . Opioid dependence ([place]) 10/06/2012  . Unspecified episodic mood disorder 10/06/2012  . Alcohol dependence ([place]) 10/06/2012  . Polysubstance abuse ([place]) 02/24/2012  . Chronic back pain 02/24/2012  .  Withdrawal from opioids ([place]) 02/24/2012  . 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 Thrombocytopenia ([place]) 02/24/2012  . Neutropenia- low relative neutrophil count 02/24/2012  . Dehydration with hypernatremia 02/24/2012  . Elevated CPK 02/24/2012  . Metabolic encephalopathy 2/2 alcohol and opiods/dehydration 02/24/2012  . Depression 02/24/2012  . Hepatitis B 02/24/2012  . Hepatitis C 02/24/2012  . Alcohol abuse, daily use 06/02/2011    Past Surgical History:  Procedure Laterality Date  . NO PAST SURGERIES     spleen removed 5 yrs ago  . ORIF ANKLE FRACTURE Left 10/06/2013   Procedure: LEFT ANKLE FRACTURE OPEN TREATMENT BILMALLEOLAR ANKLE INCLUDES INTERNAL FIXATION, LEFT ANKLE FRACTURE OPEN TREATMENT DISTAL TIBIOFIBULAR INCLUDES INTERNAL FIXATION ;  Surgeon: Renette Butters, MD;  Location: [facility];  Service: Orthopedics;  Laterality: Left;  . ORIF ANKLE FRACTURE Right 09/21/2018   Procedure: Open reduction internal fixation right trimalleolar ankle fracture;  Surgeon: Wylene Simmer, MD;  Location: [facility];  Service: Orthopedics;  Laterality: Right;  28mn  ok per OR desk to follow in Room 5  . spllenectomy          Home Medications    Prior to Admission medications   Medication Sig Start Date End Date Taking? Authorizing Provider  aspirin EC 81 MG tablet Take 1 tablet (81 mg total) by mouth 2 (two) times daily. Patient taking differently: Take 81 mg by mouth daily.  09/21/18  Yes Corky Sing, PA-C  HYDROcodone-acetaminophen (NORCO/VICODIN) 5-325 MG tablet Take 1 tablet by mouth every 4 (four) hours as needed for pain.   Yes [provider]  prazosin (MINIPRESS) 1 MG capsule Take 1 mg by mouth at bedtime.   Yes [provider]  sertraline (ZOLOFT) 100 MG tablet Take 100 mg by mouth daily.    Yes Joline Salt, RN  traMADol (ULTRAM) 50 MG tablet Take 1 tablet by mouth every 4 (four) hours as needed for pain.   Yes [provider]  traZODone (DESYREL) 50 MG tablet Take 50 mg by mouth at bedtime.   Yes [provider]    Family History Family History  Problem Relation Age of Onset  . Hepatitis Mother   . Alcohol abuse Brother     Social History Social History   Tobacco Use  . Smoking status: Current Every Day Smoker    Packs/day: 1.00    Years: 15.00    Pack years: 15.00    Types: Cigarettes  . Smokeless tobacco: Never Used  Substance Use Topics  . Alcohol use: Yes    Alcohol/week: 84.0 standard drinks    Types: 84 Cans of beer per week    Comment: in recovery since 02/2018  . Drug use: Not Currently    Frequency: 28.0 times per week    Types: Hydrocodone, Heroin    Comment: heroin quit 5 years     Allergies   Fish allergy, Sulfa antibiotics, Bee venom, and Latex   Review of Systems Review of Systems Ten systems reviewed and are negative for acute change, except as noted in the HPI.    Physical Exam Updated Vital Signs BP 115/87   Pulse 72   Temp 98 F (36.7 C) (Oral)   Resp 15   Ht 5' 7" (1.702 m)   Wt 75.1 kg   SpO2 100%   BMI 25.93 kg/m   Physical Exam Vitals signs and nursing note reviewed.  Constitutional:      General: He is not in acute distress.    Appearance: He is ill-appearing (Appears chronically ill).     Comments: Jaundiced and icteric  HENT:     Head: Normocephalic and atraumatic.     Right Ear: External ear normal.     Left Ear: External ear normal.     Nose: Nose normal.     Mouth/Throat:     Mouth: Mucous membranes are moist.  Eyes:     General: Scleral icterus present.     Extraocular Movements: Extraocular movements intact.     Pupils: Pupils are equal, round, and reactive to light.  Neck:     Musculoskeletal: Normal range of motion.  Cardiovascular:     Rate and Rhythm: Normal rate and regular rhythm.     Pulses: Normal pulses.     Comments: Notable venous dilation in the legs and abdomen Pulmonary:     Breath sounds:  Normal breath sounds.  Abdominal:     General: There is distension.     Palpations: Abdomen is soft.  Musculoskeletal: Normal range of motion.     Comments: Left leg with significant, deep ecchymotic hematoma of the the left thigh nearly circumferential.  There is involvement of the superior pubic region but does not involve the penis or scrotum.  The leg is exquisitely tender to palpation.  DP pulse 2+ in the left leg.  Skin:    General: Skin is warm and dry.  Neurological:  General: No focal deficit present.     Mental Status: He is alert and oriented to person, place, and time.     Comments: Tremulous  Psychiatric:        Behavior: Behavior normal.          ED Treatments / Results  Labs (all labs ordered are listed, but only abnormal results are displayed) Labs Reviewed  COMPREHENSIVE METABOLIC PANEL - Abnormal; Notable for the following components:      Result Value   Sodium 132 (*)    CO2 20 (*)    Glucose, Bld 101 (*)    Creatinine, Ser 0.43 (*)    Calcium 7.7 (*)    Albumin 2.0 (*)    AST 150 (*)    Alkaline Phosphatase 199 (*)    Total Bilirubin 8.2 (*)    All other components within normal limits  CBC WITH DIFFERENTIAL/PLATELET - Abnormal; Notable for the following components:   RBC 2.70 (*)    Hemoglobin 8.5 (*)    HCT 25.3 (*)    RDW 24.5 (*)    Platelets 74 (*)    nRBC 2.2 (*)    Monocytes Absolute 2.6 (*)    Abs Immature Granulocytes 0.33 (*)    All other components within normal limits  PROTIME-INR - Abnormal; Notable for the following components:   Prothrombin Time 18.1 (*)    INR 1.5 (*)    All other components within normal limits  AMMONIA - Abnormal; Notable for the following components:   Ammonia 63 (*)    All other components within normal limits  PROTIME-INR - Abnormal; Notable for the following components:   Prothrombin Time 18.3 (*)    INR 1.5 (*)    All other components within normal limits  CBC - Abnormal; Notable for the  following components:   RBC 2.70 (*)    Hemoglobin 8.3 (*)    HCT 24.5 (*)    RDW 23.6 (*)    Platelets 81 (*)    nRBC 1.5 (*)    All other components within normal limits  COMPREHENSIVE METABOLIC PANEL - Abnormal; Notable for the following components:   Sodium 134 (*)    Creatinine, Ser 0.57 (*)    Calcium 7.8 (*)    Albumin 2.0 (*)    AST 139 (*)    Alkaline Phosphatase 189 (*)    Total Bilirubin 8.1 (*)    All other components within normal limits  CBC - Abnormal; Notable for the following components:   RBC 2.67 (*)    Hemoglobin 8.3 (*)    HCT 24.5 (*)    RDW 24.0 (*)    Platelets 69 (*)    nRBC 1.7 (*)    All other components within normal limits  CBC - Abnormal; Notable for the following components:   WBC 10.6 (*)    RBC 2.98 (*)    Hemoglobin 9.4 (*)    HCT 27.5 (*)    RDW 24.5 (*)    Platelets 99 (*)    nRBC 1.6 (*)    All other components within normal limits  SARS CORONAVIRUS 2 (HOSPITAL ORDER, [facility])  ETHANOL  RAPID URINE DRUG SCREEN, HOSP PERFORMED  HIV ANTIBODY (ROUTINE TESTING W REFLEX)  MAGNESIUM  PHOSPHORUS  TSH  HCV RNA QUANT  TYPE AND SCREEN  ABO/RH    EKG None  Radiology Ct Extremity Lower Left W Contrast  Result Date: 12/28/2018 CLINICAL DATA:  Soft tissue  hematoma. EXAM: CT OF THE LOWER LEFT EXTREMITY WITH CONTRAST TECHNIQUE: Multidetector CT imaging of the lower left extremity was performed according to the standard protocol following intravenous contrast administration. COMPARISON:  None. CONTRAST:  135m OMNIPAQUE IOHEXOL 300 MG/ML  SOLN FINDINGS: Bones/Joint/Cartilage No acute displaced fracture. Ligaments Suboptimally assessed by CT. Muscles and Tendons There is a 7 x 7.4 x 13.6 cm soft tissue mass there appears to be centered in the adductor musculature of the proximal left lower extremity. The margins of this mass are not well defined. There is no CT evidence of active extravasation within this mass. Soft  tissues There is soft tissue swelling about the lower extremity. There is a small suprapatellar joint effusion. There is no specific vascular abnormality, however evaluation is limited by contrast bolus timing. There is a trace amount of free fluid in the patient's pelvis. IMPRESSION: 1. Large masslike area centered within the adductor muscles of the left lower extremity could represent a hematoma in the appropriate clinical setting. There is no evidence for active extravasation. An underlying mass is difficult to exclude. If symptoms do not resolve, short interval follow-up with a contrast-enhanced outpatient MRI is recommended for further evaluation. 2. Soft tissue swelling about the lower extremity. 3. Straight amount of free fluid in the patient's pelvis of unknown clinical significance. 4. Small left-sided suprapatellar joint effusion. Electronically Signed   By: CConstance HolsterM.D.   On: 12/28/2018 21:26    Procedures Procedures (including critical care time)  Medications Ordered in ED Medications  sodium chloride flush (NS) 0.9 % injection 3 mL (3 mLs Intravenous Given 12/29/18 2030)  sodium chloride flush (NS) 0.9 % injection 3 mL (has no administration in time range)  0.9 %  sodium chloride infusion (has no administration in time range)  LORazepam (ATIVAN) injection 2-3 mg (3 mg Intravenous Given 71/6/1029604  folic acid (FOLVITE) tablet 1 mg (1 mg Oral Not Given 12/29/18 1029)  thiamine (VITAMIN B-1) tablet 100 mg (100 mg Oral Given 12/29/18 1027)  sertraline (ZOLOFT) tablet 100 mg (100 mg Oral Given 12/29/18 1027)  traZODone (DESYREL) tablet 50 mg (50 mg Oral Not Given 12/29/18 2020)  ondansetron (ZOFRAN) tablet 4 mg (has no administration in time range)    Or  ondansetron (ZOFRAN) injection 4 mg (has no administration in time range)  traMADol (ULTRAM) tablet 50 mg (has no administration in time range)  senna (SENOKOT) tablet 8.6 mg (8.6 mg Oral Not Given 12/29/18 2040)  nicotine (NICODERM CQ  - dosed in mg/24 hours) patch 21 mg (21 mg Transdermal Not Given 12/29/18 1029)  multivitamin with minerals tablet 1 tablet (1 tablet Oral Given 12/29/18 1331)  0.9 %  sodium chloride infusion ( Intravenous New Bag/Given 12/30/18 1249)  iohexol (OMNIPAQUE) 300 MG/ML solution 100 mL (100 mLs Intravenous Contrast Given 12/28/18 2107)     Initial Impression / Assessment and Plan / ED Course  I have reviewed the triage vital signs and the nursing notes.  Pertinent labs & imaging results that were available during my care of the patient were reviewed by me and considered in my medical decision making (see chart for details).        CC:leg pain VS: stable HVW:[id number] gathered by patient and EMR. DDX: ddx + fracture, trauma, DVT, vascular injury Labs: I reviewed the labs which show  Negative COVID, UDS negative, CMP shows slightly elevated blood glucose, low creatinine, low albumin, elevated AST alk phos and total bilirubin.  The CBC shows a 3  g drop in the patient's hemoglobin in the last week, thrombocytopenia Imaging: I personally reviewed the images (CT of the left leg) which show(s) large hematoma without active extravasation or signs of infection EKG : N/A MDM: Patient with large hematoma of the leg with out signs of infection or active extravasation seen on imaging.  Patient will be admitted for observation, serial exam and serial hemoglobins to make sure he is not actively bleeding as he has end-stage Hollice Espy lip liver disease with associated coagulopathy and thrombocytopenia.  I have spoken with Dr. Roel Cluck who will admit the patient.  I spoke with Dr. Marcelino Scot who recommends Ace wrap and outpatient follow-up Patient disposition:admit Patient condition: stable. The patient appears reasonably stabilized for admission considering the current resources, flow, and capabilities available in the ED at this time, and I doubt any other [facility] requiring further screening and/or treatment in the ED prior to  admission.   Final Clinical Impressions(s) / ED Diagnoses   Final diagnoses:  Hematoma of leg, left, subsequent encounter  Acute blood loss anemia  End stage liver disease (HCC)  Coagulation disorder (HCC)  Thrombocytopenia ([place])  Elevated INR    ED Discharge Orders    None       Margarita Mail, PA-C 12/30/18 1346    Davonna Belling, MD 01/08/19 [phone]

## 2018-12-28 NOTE — ED Triage Notes (Signed)
Pt here due to on going left upper leg pain- site had deep bruising. Pt was seen here 6/30 for the same. However, pain has not gone away.

## 2018-12-29 ENCOUNTER — Other Ambulatory Visit: Payer: Self-pay

## 2018-12-29 ENCOUNTER — Encounter (HOSPITAL_COMMUNITY): Payer: Self-pay | Admitting: Internal Medicine

## 2018-12-29 DIAGNOSIS — D62 Acute posthemorrhagic anemia: Secondary | ICD-10-CM

## 2018-12-29 LAB — COMPREHENSIVE METABOLIC PANEL
ALT: 39 U/L (ref 0–44)
AST: 139 U/L — ABNORMAL HIGH (ref 15–41)
Albumin: 2 g/dL — ABNORMAL LOW (ref 3.5–5.0)
Alkaline Phosphatase: 189 U/L — ABNORMAL HIGH (ref 38–126)
Anion gap: 6 (ref 5–15)
BUN: 8 mg/dL (ref 6–20)
CO2: 22 mmol/L (ref 22–32)
Calcium: 7.8 mg/dL — ABNORMAL LOW (ref 8.9–10.3)
Chloride: 106 mmol/L (ref 98–111)
Creatinine, Ser: 0.57 mg/dL — ABNORMAL LOW (ref 0.61–1.24)
GFR calc Af Amer: >60 mL/min (ref >60)
GFR calc non Af Amer: >60 mL/min (ref >60)
Glucose, Bld: 96 mg/dL (ref 70–99)
Potassium: 3.8 mmol/L (ref 3.5–5.1)
Sodium: 134 mmol/L — ABNORMAL LOW (ref 135–145)
Total Bilirubin: 8.1 mg/dL — ABNORMAL HIGH (ref 0.3–1.2)
Total Protein: 6.9 g/dL (ref 6.5–8.1)

## 2018-12-29 LAB — CBC
HCT: 24.5 % — ABNORMAL LOW (ref 39.0–52.0)
HCT: 24.5 % — ABNORMAL LOW (ref 39.0–52.0)
Hemoglobin: 8.3 g/dL — ABNORMAL LOW (ref 13.0–17.0)
Hemoglobin: 8.3 g/dL — ABNORMAL LOW (ref 13.0–17.0)
MCH: 31.1 pg (ref 26.0–34.0)
MCH: 30.7 pg (ref 26.0–34.0)
MCHC: 33.9 g/dL (ref 30.0–36.0)
MCHC: 33.9 g/dL (ref 30.0–36.0)
MCV: 91.8 fL (ref 80.0–100.0)
MCV: 90.7 fL (ref 80.0–100.0)
Platelets: 69 10*3/uL — ABNORMAL LOW (ref 150–400)
Platelets: 81 10*3/uL — ABNORMAL LOW (ref 150–400)
RBC: 2.67 MIL/uL — ABNORMAL LOW (ref 4.22–5.81)
RBC: 2.7 MIL/uL — ABNORMAL LOW (ref 4.22–5.81)
RDW: 24 % — ABNORMAL HIGH (ref 11.5–15.5)
RDW: 23.6 % — ABNORMAL HIGH (ref 11.5–15.5)
WBC: 8.3 10*3/uL (ref 4.0–10.5)
WBC: 8.6 10*3/uL (ref 4.0–10.5)
nRBC: 1.7 % — ABNORMAL HIGH (ref 0.0–0.2)
nRBC: 1.5 % — ABNORMAL HIGH (ref 0.0–0.2)

## 2018-12-29 LAB — PROTIME-INR
INR: 1.5 — ABNORMAL HIGH (ref 0.8–1.2)
Prothrombin Time: 18.3 seconds — ABNORMAL HIGH (ref 11.4–15.2)

## 2018-12-29 LAB — HIV ANTIBODY (ROUTINE TESTING W REFLEX): HIV Screen 4th Generation wRfx: NONREACTIVE

## 2018-12-29 LAB — TSH: TSH: 2.426 u[IU]/mL (ref 0.350–4.500)

## 2018-12-29 LAB — MAGNESIUM: Magnesium: 2 mg/dL (ref 1.7–2.4)

## 2018-12-29 LAB — PHOSPHORUS: Phosphorus: 3.7 mg/dL (ref 2.5–4.6)

## 2018-12-29 MED ORDER — TRAMADOL HCL 50 MG PO TABS: 50.0000 mg | ORAL_TABLET | Freq: Four times a day (QID) | ORAL | Status: DC | PRN

## 2018-12-29 MED ORDER — SODIUM CHLORIDE 0.9% FLUSH
3.0000 mL | Freq: Two times a day (BID) | INTRAVENOUS | Status: DC
  Administered 2018-12-29 – 2019-01-09 (×22): 3 mL via INTRAVENOUS

## 2018-12-29 MED ORDER — NICOTINE 21 MG/24HR TD PT24
21.0000 mg | MEDICATED_PATCH | Freq: Every day | TRANSDERMAL | Status: DC
  Administered 2018-12-30 – 2019-01-07 (×9): 21 mg via TRANSDERMAL
  Filled 2018-12-29 (×11): qty 1

## 2018-12-29 MED ORDER — LORAZEPAM 2 MG/ML IJ SOLN
2.0000 mg | INTRAMUSCULAR | Status: DC | PRN
  Administered 2018-12-29: 3 mg via INTRAVENOUS
  Administered 2018-12-29 – 2019-01-02 (×3): 2 mg via INTRAVENOUS
  Administered 2019-01-02: 3 mg via INTRAVENOUS
  Administered 2019-01-02: 2 mg via INTRAVENOUS
  Filled 2018-12-29 (×2): qty 1
  Filled 2018-12-29: qty 2
  Filled 2018-12-29: qty 1
  Filled 2018-12-29: qty 2
  Filled 2018-12-29: qty 1

## 2018-12-29 MED ORDER — FOLIC ACID 1 MG PO TABS
1.0000 mg | ORAL_TABLET | Freq: Every day | ORAL | Status: DC
  Administered 2018-12-30 – 2019-01-09 (×11): 1 mg via ORAL
  Filled 2018-12-29 (×12): qty 1

## 2018-12-29 MED ORDER — ONDANSETRON HCL 4 MG/2ML IJ SOLN: 4.0000 mg | Freq: Four times a day (QID) | INTRAMUSCULAR | Status: DC | PRN

## 2018-12-29 MED ORDER — SENNA 8.6 MG PO TABS
1.0000 | ORAL_TABLET | Freq: Two times a day (BID) | ORAL | Status: DC
  Administered 2018-12-29 – 2019-01-08 (×15): 8.6 mg via ORAL
  Filled 2018-12-29 (×20): qty 1

## 2018-12-29 MED ORDER — ADULT MULTIVITAMIN W/MINERALS CH
1.0000 | ORAL_TABLET | Freq: Every day | ORAL | Status: DC
  Administered 2018-12-29 – 2019-01-09 (×12): 1 via ORAL
  Filled 2018-12-29 (×12): qty 1

## 2018-12-29 MED ORDER — ONDANSETRON HCL 4 MG PO TABS: 4.0000 mg | ORAL_TABLET | Freq: Four times a day (QID) | ORAL | Status: DC | PRN

## 2018-12-29 MED ORDER — SODIUM CHLORIDE 0.9 % IV SOLN: 250.0000 mL | INTRAVENOUS | Status: DC | PRN

## 2018-12-29 MED ORDER — SERTRALINE HCL 100 MG PO TABS
100.0000 mg | ORAL_TABLET | Freq: Every day | ORAL | Status: DC
  Administered 2018-12-29 – 2019-01-09 (×12): 100 mg via ORAL
  Filled 2018-12-29 (×12): qty 1

## 2018-12-29 MED ORDER — TAB-A-VITE/IRON PO TABS: 1.0000 | ORAL_TABLET | Freq: Every day | ORAL | Status: DC

## 2018-12-29 MED ORDER — SODIUM CHLORIDE 0.9% FLUSH: 3.0000 mL | INTRAVENOUS | Status: DC | PRN

## 2018-12-29 MED ORDER — VITAMIN B-1 100 MG PO TABS
100.0000 mg | ORAL_TABLET | Freq: Every day | ORAL | Status: DC
  Administered 2018-12-29 – 2019-01-09 (×12): 100 mg via ORAL
  Filled 2018-12-29 (×13): qty 1

## 2018-12-29 MED ORDER — TRAZODONE HCL 50 MG PO TABS
50.0000 mg | ORAL_TABLET | Freq: Every day | ORAL | Status: DC
  Administered 2018-12-29: 50 mg via ORAL
  Filled 2018-12-29 (×2): qty 1

## 2018-12-29 NOTE — Progress Notes (Signed)
Pt received from ED, ao x4. Vitals stable . CHG bath completed, connected to tele and CCMD notified. oriented pt to room and call bell system. Call bell within reach. Will continue to monitor.

## 2018-12-29 NOTE — Progress Notes (Signed)
PROGRESS NOTE  Antonio [name] SWF:[id number] DOB: 21-Mar-1971 DOA: 12/28/2018 PCP: Patient, No Pcp Per  Brief History   48 year old man PMH alcohol abuse, alcoholic cirrhosis, hepatitis B, hepatitis C, end-stage liver disease, seen in the emergency department 6/34 left leg pain, hematoma, patient denied trauma.  Imaging including plain film x-ray and lower extremity venous Doppler were unremarkable.  He presented again 7/7 with ongoing left leg pain and deep bruising.  Continued ongoing daily drinking.  History of severe delirium tremens.  EDP consulted with Dr. Ginette Pitman of orthopedics over the phone, he recommended admission to medicine, gentle Ace bandage, consult orthopedics again if needed.  Admitted for further evaluation of hematoma and monitoring of anemia.  A & P  Traumatic hematoma left leg with ongoing pain in the setting of end-stage liver disease, chronic thrombocytopenia, coagulopathy with elevated INR.  Occurred after the patient struck his thigh several times as an outpatient. --Recent imaging 2 view femur x-ray and vascular ultrasound of the left leg were unremarkable --CT consistent with hematoma. --Orthopedics recommended ACE bandage, supportive care.   --Afebrile, vital signs stable --Consult PT --Hemoglobin.  Hemoglobin stable from last night.  Acute blood loss anemia secondary to above --Hemoglobin stable today at 8.3 last evening 8.5 --We will check hemoglobin in a.m., if stable anticipate discharge home  End-stage liver disease secondary to alcoholic cirrhosis with ongoing daily drinking, complicated by history of hepatitis B and hepatitis C; with associated thrombocytopenia, hyponatremia, hyperbilirubinemia --Possible component of alcoholic hepatitis.  Discriminant function 22.4.  No indication for steroids and would hold off given uncertain hepatitis B status.  Hepatitis B, C --Follow-up with gastroenterology as an outpatient  PMH severe delirium tremens when  withdrawing from alcohol (patient denies seizures) --Per chart the patient is interested in quitting drinking --Social work consultation, continue CIWA  Tobacco abuse --Nicotine patch  DVT prophylaxis: low plts Code Status: Full Family Communication: none Disposition Plan: home    Murray Hodgkins, MD  [facility] Direct contact: see [email or web address] (further directions at bottom of note if needed) 7PM-7AM contact night coverage as at bottom of note 12/29/2018, 11:46 AM  LOS: 0 days   Consultants  . None   Procedures  . None   Antibiotics  . None   Interval History/Subjective  Feels better today, less pain in left leg, it is better today.  He reports the swelling in his left leg began after he struck it several times because he had some numbness in it.  Numbness has resolved.  No pain in lower leg or foot on the left.  Eating well.  No bleeding noted.  He wants to stop drinking.  He does not think he has hepatitis B.  Objective   Vitals:  Vitals:   12/29/18 0653 12/29/18 0808  BP: 118/80 137/73  Pulse: 70 73  Resp: 11 10  Temp: 98.3 F (36.8 C) 98.9 F (37.2 C)  SpO2: 98% 98%    Exam:  Constitutional:  . Appears calm and comfortable ENMT:  . grossly normal hearing  Respiratory:  . CTA bilaterally, no w/r/r.  . Respiratory effort normal.  Cardiovascular:  . RRR, no m/r/g . No left lower extremity edema.  Significant left thigh edema. . Normal left dorsalis pedis pulse, 2+ . GU: Uncircumcised penis, scrotum appear unremarkable. Musculoskeletal:  . Left thigh nontender to palpation Skin:  . Significant ecchymosis noted circumferentially of left thigh Psychiatric:  . Mental status o Mood, affect appropriate  I have personally reviewed the following:   Today's  Data  . Sodium 134, remainder BMP unremarkable.  Magnesium and phosphorus within normal limits.  Anion gap within normal limits. . Alkaline phosphatase stable at 189, AST trending down 139.  ALT  within normal limits at 39.  Serum ammonia was modestly elevated at 63.  Total bilirubin stable at 8.1 compared to yesterday.  Significantly higher since June 2020, 5.1. . Hemoglobin stable at 8.3, platelets stable at 69 . INR stable at 1.5 . TSH within normal limits . Serum alcohol negative and urine drug screen negative  Lab Data  . SARS-CoV-2 negative . Hepatitis B surface antigen was negative in 2017, hepatitis C was positive with viral load 388,000.  HIV was nonreactive 2017.  Micro Data   Imaging  . CT left leg showed large masslike area within the abductor muscles left lower extremity which could represent hematoma in the correct clinical setting.  No extravasation noted.  Underlying mass difficult to exclude.  If symptoms do not resolve, consider MRI as an outpatient.  Cardiology Data  . EKG sinus rhythm with PVCs, left atrial enlargement  Other Data   Scheduled Meds: . folic acid  1 mg Oral Daily  . nicotine  21 mg Transdermal Daily  . senna  1 tablet Oral BID  . sertraline  100 mg Oral Daily  . sodium chloride flush  3 mL Intravenous Q12H  . thiamine  100 mg Oral Daily  . traZODone  50 mg Oral QHS   Continuous Infusions: . sodium chloride      Principal Problem:   Thigh hematoma, left, initial encounter Active Problems:   Alcohol abuse, daily use   Thrombocytopenia (HCC)   Hepatitis C   Hyponatremia   Alcoholic cirrhosis of liver (HCC)   Hematoma   Acute blood loss anemia   LOS: 0 days   How to contact the [facility] Attending or Consulting provider [place] or covering provider during after hours [place], for this patient?  1. Check the care team in [facility] and look for a) attending/consulting [facility] provider listed and b) the [facility] team listed 2. Log into [email or web address] and use [place]'s universal password to access. If you do not have the password, please contact the hospital operator. 3. Locate the [facility] provider you are looking for under [facility] and page to a  number that you can be directly reached. 4. If you still have difficulty reaching the provider, please page the [facility] (Director on Call) for the Hospitalists listed on amion for assistance.

## 2018-12-29 NOTE — Progress Notes (Signed)
Spoke to [facility] and updated her on his condition.  Answered all her questions.

## 2018-12-29 NOTE — ED Notes (Signed)
ED TO INPATIENT HANDOFF REPORT  ED Nurse Name and Phone #:  [phone]  S Name/Age/Gender Antonio [name] 48 y.o. male Room/Bed: 007C/007C  Code Status   Code Status: Prior  Home/SNF/Other Home Patient oriented to: self, place, time and situation Is this baseline? Yes   Triage Complete: Triage complete  Chief Complaint leg pain  Triage Note  Pt here due to on going left upper leg pain- site had deep bruising. Pt was seen here 6/30 for the same. However, pain has not gone away.    Allergies Allergies  Allergen Reactions  . Fish Allergy Itching and Swelling  . Sulfa Antibiotics Swelling  . Bee Venom Swelling  . Latex     Level of Care/Admitting Diagnosis ED Disposition    ED Disposition Condition Comment   Admit  Hospital Area: [place] [[id number]]  Level of Care: Progressive [102]  I expect the patient will be discharged within 24 hours: No (not a candidate for 5C-Observation unit)  Covid Evaluation: Asymptomatic Screening Protocol (No Symptoms)  Diagnosis: Hematoma [250539]  Admitting Physician: Toy Baker [[id number]]  Attending Physician: Toy Baker [[id number]]  PT Class (Do Not Modify): Observation [104]  PT Acc Code (Do Not Modify): Observation [10022]       B Medical/Surgery History Past Medical History:  Diagnosis Date  . Alcoholic ([place])    in recovery since 02/2018  . Asthma   . Complication of anesthesia    hard to wake up  . Depression 09/17/2018   h/o suicidal ideation, previously homeless.  . Hepatitis    hep b and c  . Polysubstance overdose   . Ruptured spleen    Past Surgical History:  Procedure Laterality Date  . NO PAST SURGERIES     spleen removed 5 yrs ago  . ORIF ANKLE FRACTURE Left 10/06/2013   Procedure: LEFT ANKLE FRACTURE OPEN TREATMENT BILMALLEOLAR ANKLE INCLUDES INTERNAL FIXATION, LEFT ANKLE FRACTURE OPEN TREATMENT DISTAL TIBIOFIBULAR INCLUDES INTERNAL FIXATION ;  Surgeon: Renette Butters, MD;   Location: [facility];  Service: Orthopedics;  Laterality: Left;  . ORIF ANKLE FRACTURE Right 09/21/2018   Procedure: Open reduction internal fixation right trimalleolar ankle fracture;  Surgeon: Wylene Simmer, MD;  Location: [facility];  Service: Orthopedics;  Laterality: Right;  90min  ok per OR desk to follow in Room 5  . spllenectomy       A IV Location/Drains/Wounds Patient Lines/Drains/Airways Status   Active Line/Drains/Airways    Name:   Placement date:   Placement time:   Site:   Days:   Peripheral IV 12/28/18 Right Forearm   12/28/18    2000    Forearm   1   Incision (Closed) 09/21/18 Ankle Right   09/21/18    1011     99          Intake/Output Last 24 hours No intake or output data in the 24 hours ending 12/29/18 0025  Labs/Imaging Results for orders placed or performed during the hospital encounter of 12/28/18 (from the past 48 hour(s))  Comprehensive metabolic panel     Status: Abnormal   Collection Time: 12/28/18  6:22 PM  Result Value Ref Range   Sodium 132 (L) 135 - 145 mmol/L   Potassium 3.9 3.5 - 5.1 mmol/L   Chloride 105 98 - 111 mmol/L   CO2 20 (L) 22 - 32 mmol/L   Glucose, Bld 101 (H) 70 - 99 mg/dL   BUN 8 6 - 20 mg/dL  Creatinine, Ser 0.43 (L) 0.61 - 1.24 mg/dL   Calcium 7.7 (L) 8.9 - 10.3 mg/dL   Total Protein 6.9 6.5 - 8.1 g/dL   Albumin 2.0 (L) 3.5 - 5.0 g/dL   AST 150 (H) 15 - 41 U/L   ALT 40 0 - 44 U/L   Alkaline Phosphatase 199 (H) 38 - 126 U/L   Total Bilirubin 8.2 (H) 0.3 - 1.2 mg/dL   GFR calc non Af Amer >60 >60 mL/min   GFR calc Af Amer >60 >60 mL/min   Anion gap 7 5 - 15    Comment: Performed at [facility], [place] [address] [place], [place] [address]  CBC with Differential     Status: Abnormal   Collection Time: 12/28/18  6:22 PM  Result Value Ref Range   WBC 8.2 4.0 - 10.5 K/uL   RBC 2.70 (L) 4.22 - 5.81 MIL/uL   Hemoglobin 8.5 (L) 13.0 - 17.0 g/dL   HCT 25.3 (L) 39.0 - 52.0 %   MCV 93.7 80.0 -  100.0 fL   MCH 31.5 26.0 - 34.0 pg   MCHC 33.6 30.0 - 36.0 g/dL   RDW 24.5 (H) 11.5 - 15.5 %    Comment: REPEATED TO VERIFY   Platelets 74 (L) 150 - 400 K/uL    Comment: REPEATED TO VERIFY PLATELET COUNT CONFIRMED BY SMEAR SPECIMEN CHECKED FOR CLOTS Immature Platelet Fraction may be clinically indicated, consider ordering this additional test [id number]    nRBC 2.2 (H) 0.0 - 0.2 %   Neutrophils Relative % 32 %   Neutro Abs 2.7 1.7 - 7.7 K/uL   Lymphocytes Relative 27 %   Lymphs Abs 2.2 0.7 - 4.0 K/uL   Monocytes Relative 32 %   Monocytes Absolute 2.6 (H) 0.1 - 1.0 K/uL   Eosinophils Relative 4 %   Eosinophils Absolute 0.3 0.0 - 0.5 K/uL   Basophils Relative 1 %   Basophils Absolute 0.1 0.0 - 0.1 K/uL   Immature Granulocytes 4 %   Abs Immature Granulocytes 0.33 (H) 0.00 - 0.07 K/uL    Comment: Performed at [facility] [address], [place], [place] [address]  Protime-INR     Status: Abnormal   Collection Time: 12/28/18  6:22 PM  Result Value Ref Range   Prothrombin Time 18.1 (H) 11.4 - 15.2 seconds   INR 1.5 (H) 0.8 - 1.2    Comment: (NOTE) INR goal varies based on device and disease states. Performed at [facility], [place] [address] [place], [place] [address]   Type and screen     Status: None   Collection Time: 12/28/18  8:00 PM  Result Value Ref Range   ABO/RH(D) O POS    Antibody Screen NEG    Sample Expiration      12/31/2018,2359 Performed at [facility], [place] [address] [place], [place] [address]   ABO/Rh     Status: None   Collection Time: 12/28/18  8:00 PM  Result Value Ref Range   ABO/RH(D)      O POS Performed at [facility] [address] [place],  [address]   Urine rapid drug screen (hosp performed)     Status: None   Collection Time: 12/28/18  9:00 PM  Result Value Ref Range   Opiates NONE DETECTED NONE DETECTED   Cocaine NONE DETECTED NONE DETECTED   Benzodiazepines NONE DETECTED NONE DETECTED    Amphetamines NONE DETECTED NONE DETECTED  Tetrahydrocannabinol NONE DETECTED NONE DETECTED   Barbiturates NONE DETECTED NONE DETECTED    Comment: (NOTE) DRUG SCREEN FOR MEDICAL PURPOSES ONLY.  IF CONFIRMATION IS NEEDED FOR ANY PURPOSE, NOTIFY LAB WITHIN 5 DAYS. LOWEST DETECTABLE LIMITS FOR URINE DRUG SCREEN Drug Class                     Cutoff (ng/mL) Amphetamine and metabolites    1000 Barbiturate and metabolites    200 Benzodiazepine                 409 Tricyclics and metabolites     300 Opiates and metabolites        300 Cocaine and metabolites        300 THC                            50 Performed at [facility], [place] [address] [place], [place] [address]   SARS Coronavirus 2 (CEPHEID- Performed in [facility] hospital lab), Hosp Order     Status: None   Collection Time: 12/28/18  9:26 PM   Specimen: Nasopharyngeal Swab  Result Value Ref Range   SARS Coronavirus 2 NEGATIVE NEGATIVE    Comment: (NOTE) If result is NEGATIVE SARS-CoV-2 target nucleic acids are NOT DETECTED. The SARS-CoV-2 RNA is generally detectable in upper and lower  respiratory specimens during the acute phase of infection. The lowest  concentration of SARS-CoV-2 viral copies this assay can detect is 250  copies / mL. A negative result does not preclude SARS-CoV-2 infection  and should not be used as the sole basis for treatment or other  patient management decisions.  A negative result may occur with  improper specimen collection / handling, submission of specimen other  than nasopharyngeal swab, presence of viral mutation(s) within the  areas targeted by this assay, and inadequate number of viral copies  (<250 copies / mL). A negative result must be combined with clinical  observations, patient history, and epidemiological information. If result is POSITIVE SARS-CoV-2 target nucleic acids are DETECTED. The SARS-CoV-2 RNA is generally detectable in upper and lower  respiratory specimens  dur ing the acute phase of infection.  Positive  results are indicative of active infection with SARS-CoV-2.  Clinical  correlation with patient history and other diagnostic information is  necessary to determine patient infection status.  Positive results do  not rule out bacterial infection or co-infection with other viruses. If result is PRESUMPTIVE POSTIVE SARS-CoV-2 nucleic acids MAY BE PRESENT.   A presumptive positive result was obtained on the submitted specimen  and confirmed on repeat testing.  While 2019 novel coronavirus  (SARS-CoV-2) nucleic acids may be present in the submitted sample  additional confirmatory testing may be necessary for epidemiological  and / or clinical management purposes  to differentiate between  SARS-CoV-2 and other Sarbecovirus currently known to infect humans.  If clinically indicated additional testing with an alternate test  methodology [phone]) is advised. The SARS-CoV-2 RNA is generally  detectable in upper and lower respiratory sp ecimens during the acute  phase of infection. The expected result is Negative. Fact Sheet for Patients:  StrictlyIdeas.no Fact Sheet for Healthcare Providers: BankingDealers.co.za This test is not yet approved or cleared by the [place] FDA and has been authorized for detection and/or diagnosis of SARS-CoV-2 by FDA under an Emergency Use Authorization (EUA).  This EUA will remain in effect (meaning this test can  be used) for the duration of the COVID-19 declaration under Section 564(b)(1) of the Act, 21 U.S.C. section 360bbb-3(b)(1), unless the authorization is terminated or revoked sooner. Performed at [facility], [place] [address] [place], [place] [address]   Ammonia     Status: Abnormal   Collection Time: 12/28/18  9:26 PM  Result Value Ref Range   Ammonia 63 (H) 9 - 35 umol/L    Comment: Performed at [facility] [address],  [place], [place] [address]  Ethanol     Status: None   Collection Time: 12/28/18  9:26 PM  Result Value Ref Range   Alcohol, Ethyl (B) <10 <10 mg/dL    Comment: (NOTE) Lowest detectable limit for serum alcohol is 10 mg/dL. For medical purposes only. Performed at [facility], [place] [address] [place], [place] [address]    Ct Extremity Lower Left W Contrast  Result Date: 12/28/2018 CLINICAL DATA:  Soft tissue hematoma. EXAM: CT OF THE LOWER LEFT EXTREMITY WITH CONTRAST TECHNIQUE: Multidetector CT imaging of the lower left extremity was performed according to the standard protocol following intravenous contrast administration. COMPARISON:  None. CONTRAST:  126mL OMNIPAQUE IOHEXOL 300 MG/ML  SOLN FINDINGS: Bones/Joint/Cartilage No acute displaced fracture. Ligaments Suboptimally assessed by CT. Muscles and Tendons There is a 7 x 7.4 x 13.6 cm soft tissue mass there appears to be centered in the adductor musculature of the proximal left lower extremity. The margins of this mass are not well defined. There is no CT evidence of active extravasation within this mass. Soft tissues There is soft tissue swelling about the lower extremity. There is a small suprapatellar joint effusion. There is no specific vascular abnormality, however evaluation is limited by contrast bolus timing. There is a trace amount of free fluid in the patient's pelvis. IMPRESSION: 1. Large masslike area centered within the adductor muscles of the left lower extremity could represent a hematoma in the appropriate clinical setting. There is no evidence for active extravasation. An underlying mass is difficult to exclude. If symptoms do not resolve, short interval follow-up with a contrast-enhanced outpatient MRI is recommended for further evaluation. 2. Soft tissue swelling about the lower extremity. 3. Straight amount of free fluid in the patient's pelvis of unknown clinical significance. 4. Small left-sided suprapatellar joint effusion.  Electronically Signed   By: Constance Holster M.D.   On: 12/28/2018 21:26    Pending Labs Unresulted Labs (From admission, onward)    Start     Ordered   12/29/18 0500  Protime-INR  Tomorrow morning,   R     12/28/18 2036   Signed and Held  CBC  Now then every 6 hours,   R    Comments: Call md if Hg <8    Signed and Held          Vitals/Pain Today's Vitals   12/28/18 1610 12/28/18 1940 12/28/18 2352 12/29/18 0000  BP:  (!) 131/91 109/78 118/78  Pulse:  77 77 71  Resp:  16 18 13   Temp:      TempSrc:      SpO2:  97% 100% 98%  Weight: 71.7 kg     Height: 5\' 7"  (1.702 m)     PainSc:        Isolation Precautions No active isolations  Medications Medications  iohexol (OMNIPAQUE) 300 MG/ML solution 100 mL (100 mLs Intravenous Contrast Given 12/28/18 2107)    Mobility walks Low fall risk   Focused Assessments  Cardiac Assessment Handoff:    Lab Results  Component Value Date   EVOJJKK 938 (H) 12/21/2018   TROPONINI <0.03 02/13/2016   No results found for: DDIMER Does the Patient currently have chest pain? No     R Recommendations: See Admitting Provider Note  Report given to:   Additional Notes:  Speaks english

## 2018-12-29 NOTE — ED Notes (Signed)
Please call Rosana Hoes [facility]) [phone] a status update--Leslie

## 2018-12-29 NOTE — Plan of Care (Signed)
Poc progressing.  

## 2018-12-30 DIAGNOSIS — F10231 Alcohol dependence with withdrawal delirium: Secondary | ICD-10-CM

## 2018-12-30 LAB — CBC
HCT: 27.5 % — ABNORMAL LOW (ref 39.0–52.0)
Hemoglobin: 9.4 g/dL — ABNORMAL LOW (ref 13.0–17.0)
MCH: 31.5 pg (ref 26.0–34.0)
MCHC: 34.2 g/dL (ref 30.0–36.0)
MCV: 92.3 fL (ref 80.0–100.0)
Platelets: 99 10*3/uL — ABNORMAL LOW (ref 150–400)
RBC: 2.98 MIL/uL — ABNORMAL LOW (ref 4.22–5.81)
RDW: 24.5 % — ABNORMAL HIGH (ref 11.5–15.5)
WBC: 10.6 10*3/uL — ABNORMAL HIGH (ref 4.0–10.5)
nRBC: 1.6 % — ABNORMAL HIGH (ref 0.0–0.2)

## 2018-12-30 MED ORDER — HALOPERIDOL LACTATE 5 MG/ML IJ SOLN
2.5000 mg | Freq: Once | INTRAMUSCULAR | Status: DC
  Administered 2018-12-30: 2.5 mg via INTRAVENOUS

## 2018-12-30 MED ORDER — HALOPERIDOL LACTATE 5 MG/ML IJ SOLN
5.0000 mg | Freq: Once | INTRAMUSCULAR | Status: DC
  Filled 2018-12-30: qty 1

## 2018-12-30 MED ORDER — SODIUM CHLORIDE 0.9 % IV SOLN
INTRAVENOUS | Status: DC
  Administered 2018-12-30 – 2018-12-31 (×3): via INTRAVENOUS

## 2018-12-30 NOTE — Progress Notes (Signed)
PT Cancellation Note  Patient Details Name: Antonio [name] MRN: [id number] DOB: Jan 05, 1971   Cancelled Treatment:    Reason Eval/Treat Not Completed: Patient not medically ready Nursing request PT to hold currently as patient has been very agitated. Will continue to follow.   Lanney Gins, PT, DPT Supplemental Physical Therapist 12/30/18 9:09 AM Pager: [phone] Office: [phone]

## 2018-12-30 NOTE — Care Management (Signed)
Attempted to speak w patient at bedside. He is in two point restraints, unable to stay awake to hold conversation. Left substance etoh abuse resources on [facility]. No insurance or PCP noted. CM will continue to follow for resources at DC.

## 2018-12-30 NOTE — Progress Notes (Signed)
During bedside report patient found to be confused and agitated and attempting to dress himself to leave. Administered prn ativan at 20:40. Received phone call from patient's POA, she agreed to help with patient by coming to hospital to sit in lieu of applying restraints. Ativan was ineffective.  POA visit did not help calm or orient patient, who continued agitated, confused and responding to imaginary voices and people. Patient pulled out IV and continues to thrash about and attempt to exit the bed. Soft wrist restraints applied, patient checked for circulation, toilet needs, food and water needs. Will continue to monitor.

## 2018-12-30 NOTE — Plan of Care (Signed)
  Problem: Education: Goal: Knowledge of General Education information will improve Description: Including pain rating scale, medication(s)/side effects and non-pharmacologic comfort measures Outcome: Not Progressing   Problem: Health Behavior/Discharge Planning: Goal: Ability to manage health-related needs will improve Outcome: Not Progressing   

## 2018-12-30 NOTE — Progress Notes (Addendum)
PROGRESS NOTE  Antonio [name] LGX:[id number] DOB: 1971/01/18 DOA: 12/28/2018 PCP: Patient, No Pcp Per  Brief History   48 year old man PMH alcohol abuse, alcoholic cirrhosis, hepatitis B, hepatitis C, end-stage liver disease, seen in the emergency department 6/34 left leg pain, hematoma, patient denied trauma.  Imaging including plain film x-ray and lower extremity venous Doppler were unremarkable.  He presented again 7/7 with ongoing left leg pain and deep bruising.  Continued ongoing daily drinking.  History of severe delirium tremens.  EDP consulted with Dr. Ginette Pitman of orthopedics over the phone, he recommended admission to medicine, gentle Ace bandage, consult orthopedics again if needed.  Admitted for further evaluation of hematoma and monitoring of anemia.  A & P  Traumatic hematoma left leg with ongoing pain in the setting of end-stage liver disease, chronic thrombocytopenia, coagulopathy with elevated INR.  Occurred after the patient struck his thigh several times as an outpatient. Recent imaging 2 view femur x-ray and vascular ultrasound of the left leg were unremarkable. CT consistent with hematoma. --Orthopedics recommended ACE bandage, supportive care.   --Hemoglobin remained stable.  Expect spontaneous resolution.  No further evaluation suggested.  Acute alcohol withdrawal with delirium --Currently resting comfortably.  Minimally awakens to voice after doses of Ativan and Haldol last evening. --Stop Haldol.  Treat withdrawal with Ativan as per CIWA.  Keep on progressive care.  Monitor closely.  Acute blood loss anemia secondary to above --Hemoglobin remained stable.  No further evaluation suggested.  End-stage liver disease secondary to alcoholic cirrhosis with ongoing daily drinking, complicated by history of hepatitis B and hepatitis C; with associated thrombocytopenia, hyponatremia, hyperbilirubinemia --Possible component of alcoholic hepatitis.  Discriminant function 22.4.  No  indication for steroids and would hold off given uncertain hepatitis B status.  Hepatitis B, C --Follow-up with gastroenterology as an outpatient  PMH severe delirium tremens when withdrawing from alcohol (patient denies seizures) --Per chart the patient is interested in quitting drinking --Social work consultation, continue CIWA  Tobacco abuse --Nicotine patch   Initial presenting issues are stable but now has acute alcohol withdrawal and is not stable for discharge. Continue CIWA.  Start IV fluids while not eating.  Check BMP in a.m.  DVT prophylaxis: low plts Code Status: Full Family Communication: none Disposition Plan: home    Murray Hodgkins, MD  [facility] Direct contact: see [email or web address] (further directions at bottom of note if needed) 7PM-7AM contact night coverage as at bottom of note 12/30/2018, 11:33 AM  LOS: 1 day   Consultants  . None   Procedures  . None   Antibiotics  . None   Interval History/Subjective  Became agitated overnight, reportedly Ativan was ineffective (although minimal was given), patient pulled out IV, soft wrist restraints were applied.  Given Haldol overnight.  Again agitated this morning per nursing.  Objective   Vitals:  Vitals:   12/30/18 0516 12/30/18 0848  BP: 132/85 122/88  Pulse:  70  Resp: 19 15  Temp:    SpO2:  100%    Exam:  Constitutional:  . Appears calm and comfortable resting, briefly arouses to sternal rub, mumbles Respiratory:  . CTA bilaterally, no w/r/r.  . Respiratory effort normal.  Cardiovascular:  . RRR, no m/r/g . No right lower extremity edema.  No change in left thigh edema. . Left foot dorsalis pedis pulse 2+ Musculoskeletal:  . Digits/nails BUE: no clubbing, cyanosis, petechiae, infection . RLE, LLE   . Quite somnolent, does not respond to commands at the present time Skin:  .  No significant change in ecchymosis left thigh Psychiatric:  . Mental status . Cannot assess mood or affect at  this point  I have personally reviewed the following:   Today's Data  . Hemoglobin stable at 9.4.  Platelets stable at 99.  Lab Data  . SARS-CoV-2 negative . Hepatitis B surface antigen was negative in 2017, hepatitis C was positive with viral load 388,000.  HIV was nonreactive 2017.  Micro Data   Imaging  . CT left leg showed large masslike area within the abductor muscles left lower extremity which could represent hematoma in the correct clinical setting.  No extravasation noted.  Underlying mass difficult to exclude.  If symptoms do not resolve, consider MRI as an outpatient.  Cardiology Data  . EKG sinus rhythm with PVCs, left atrial enlargement  Other Data   Scheduled Meds: . folic acid  1 mg Oral Daily  . haloperidol lactate  5 mg Intramuscular Once  . multivitamin with minerals  1 tablet Oral Daily  . nicotine  21 mg Transdermal Daily  . senna  1 tablet Oral BID  . sertraline  100 mg Oral Daily  . sodium chloride flush  3 mL Intravenous Q12H  . thiamine  100 mg Oral Daily  . traZODone  50 mg Oral QHS   Continuous Infusions: . sodium chloride      Principal Problem:   Thigh hematoma, left, initial encounter Active Problems:   Alcohol abuse, daily use   Thrombocytopenia (HCC)   Hepatitis C   Hyponatremia   Alcoholic cirrhosis of liver (HCC)   Hematoma   Acute blood loss anemia   LOS: 1 day   How to contact the [facility] Attending or Consulting provider [place] or covering provider during after hours [place], for this patient?  1. Check the care team in [facility] and look for a) attending/consulting [facility] provider listed and b) the [facility] team listed 2. Log into [email or web address] and use [facility]'s universal password to access. If you do not have the password, please contact the hospital operator. 3. Locate the [facility] provider you are looking for under [facility] and page to a number that you can be directly reached. 4. If you still have difficulty reaching the provider,  please page the [facility] (Director on Call) for the Hospitalists listed on amion for assistance.

## 2018-12-31 LAB — BASIC METABOLIC PANEL
Anion gap: 6 (ref 5–15)
BUN: 11 mg/dL (ref 6–20)
CO2: 20 mmol/L — ABNORMAL LOW (ref 22–32)
Calcium: 7.8 mg/dL — ABNORMAL LOW (ref 8.9–10.3)
Chloride: 107 mmol/L (ref 98–111)
Creatinine, Ser: 0.59 mg/dL — ABNORMAL LOW (ref 0.61–1.24)
GFR calc Af Amer: >60 mL/min (ref >60)
GFR calc non Af Amer: >60 mL/min (ref >60)
Glucose, Bld: 72 mg/dL (ref 70–99)
Potassium: 4.7 mmol/L (ref 3.5–5.1)
Sodium: 133 mmol/L — ABNORMAL LOW (ref 135–145)

## 2018-12-31 NOTE — Evaluation (Signed)
Physical Therapy Evaluation Patient Details Name: Antonio [name] MRN: [id number] DOB: March 30, 1971 Today's Date: 12/31/2018   History of Present Illness  48 year old man PMH alcohol abuse, alcoholic cirrhosis, hepatitis B, hepatitis C, end-stage liver disease, seen in the emergency department 6/34 left leg pain, hematoma, patient denied trauma.  Imaging including plain film x-ray and lower extremity venous Doppler were unremarkable.  He presented again 7/7 with ongoing left leg pain and deep bruising.  Continued ongoing daily drinking.  History of severe delirium tremens. Admitted 12/29/2018 for treatment of Traumatic hematoma left leg with ongoing pain in the setting of end-stage liver disease, chronic thrombocytopenia, coagulopathy with elevated INR and alcohol withdrawl  Clinical Impression  Pt with difficulty staying awake, and with increased mumbling with providing PLOF and home setup. Pt reports that he is living in single story home with level entry and he was getting around with cane. Pt is limited in safe mobility by alcohol withdrawal and associated tremors, as well as decreased cognition and generalized weakness. Pt with difficulty with command following. Pt requires min A for bed mobility and modA for stand pivot to recliner. Pt fell asleep almost immediately on sitting in recliner. PT recommending SNF level rehab at discharge to improve mobility and safety. PT will follow acutely    Follow Up Recommendations SNF    Equipment Recommendations  Other (comment)(TBD at next venue)    Recommendations for Other Services OT consult     Precautions / Restrictions Precautions Precautions: Fall      Mobility  Bed Mobility Overal bed mobility: Needs Assistance Bed Mobility: Supine to Sit     Supine to sit: Min assist     General bed mobility comments: minA for scooting hips to EoB, increased time and effort due to tremors  Transfers Overall transfer level: Needs  assistance Equipment used: 1 person hand held assist Transfers: Stand Pivot Transfers   Stand pivot transfers: Mod assist       General transfer comment: modA for pivoting to recliner due to increased knee buckling and tremulous movement  Ambulation/Gait             General Gait Details: unable to attempt        Balance Overall balance assessment: Needs assistance Sitting-balance support: Feet supported;Bilateral upper extremity supported Sitting balance-Leahy Scale: Poor     Standing balance support: Bilateral upper extremity supported Standing balance-Leahy Scale: Zero                               Pertinent Vitals/Pain Pain Assessment: Faces Faces Pain Scale: Hurts little more Pain Location: L LE with movement  Pain Descriptors / Indicators: Grimacing;Guarding Pain Intervention(s): Limited activity within patient's tolerance;Monitored during session;Repositioned    Home Living Family/patient expects to be discharged to:: Private residence Living Arrangements: Children Available Help at Discharge: Other (Comment)(sponsor) Type of Home: House       Home Layout: One level Home Equipment: Cane - single point      Prior Function Level of Independence: Independent with assistive device(s)                  Extremity/Trunk Assessment   Upper Extremity Assessment Upper Extremity Assessment: Difficult to assess due to impaired cognition    Lower Extremity Assessment Lower Extremity Assessment: Difficult to assess due to impaired cognition       Communication   Communication: Expressive difficulties(mumbling requires repeating to understand)  Cognition Arousal/Alertness: Awake/alert Behavior  During Therapy: Flat affect Overall Cognitive Status: Impaired/Different from baseline Area of Impairment: Orientation;Following commands;Problem solving;Memory;Attention                 Orientation Level: Time;Situation Current Attention  Level: Sustained Memory: Decreased short-term memory;Decreased recall of precautions Following Commands: Follows one step commands inconsistently;Follows multi-step commands inconsistently;Follows one step commands with increased time;Follows multi-step commands with increased time     Problem Solving: Slow processing;Decreased initiation;Difficulty sequencing;Requires verbal cues;Requires tactile cues        General Comments General comments (skin integrity, edema, etc.): Pt with large hematoma on L upper medial thigh, pt with some grimacing with movement. BP 145/93, HR at rest 78 bpm, with movement, 121bpm, SaO2 throughout on RA >92%O2    Exercises     Assessment/Plan    PT Assessment Patient needs continued PT services  PT Problem List Decreased activity tolerance;Decreased balance;Decreased mobility;Decreased cognition;Decreased coordination;Decreased safety awareness;Pain       PT Treatment Interventions DME instruction;Gait training;Stair training;Functional mobility training;Therapeutic activities;Therapeutic exercise;Balance training;Cognitive remediation;Patient/family education    PT Goals (Current goals can be found in the Care Plan section)  Acute Rehab PT Goals Patient Stated Goal: none stated PT Goal Formulation: Patient unable to participate in goal setting Time For Goal Achievement: 01/14/19 Potential to Achieve Goals: Fair    Frequency Min 3X/week    AM-PAC PT "6 Clicks" Mobility  Outcome Measure Help needed turning from your back to your side while in a flat bed without using bedrails?: None Help needed moving from lying on your back to sitting on the side of a flat bed without using bedrails?: A Little Help needed moving to and from a bed to a chair (including a wheelchair)?: A Lot Help needed standing up from a chair using your arms (e.g., wheelchair or bedside chair)?: A Lot Help needed to walk in hospital room?: Total Help needed climbing 3-5 steps with  a railing? : Total 6 Click Score: 13    End of Session Equipment Utilized During Treatment: Gait belt Activity Tolerance: Treatment limited secondary to medical complications (Comment)(tremors) Patient left: in chair;with call bell/phone within reach;with chair alarm set Nurse Communication: Mobility status PT Visit Diagnosis: Unsteadiness on feet (R26.81);Other abnormalities of gait and mobility (R26.89);Muscle weakness (generalized) (M62.81);Difficulty in walking, not elsewhere classified (R26.2);Other symptoms and signs involving the nervous system (R29.898);Ataxic gait (R26.0)    Time: 3646-8032 PT Time Calculation (min) (ACUTE ONLY): 17 min   Charges:   PT Evaluation $PT Eval Moderate Complexity: 1 Mod          Makinzy Cleere B. Migdalia Dk PT, DPT Acute Rehabilitation Services Pager [phone] Office [phone]   [place] 12/31/2018, 2:17 PM

## 2018-12-31 NOTE — Progress Notes (Signed)
PROGRESS NOTE  Antonio [name] NID:[id number] DOB: 10/02/1970 DOA: 12/28/2018 PCP: Patient, No Pcp Per  Brief History   48 year old man PMH alcohol abuse, alcoholic cirrhosis, hepatitis B, hepatitis C, end-stage liver disease, seen in the emergency department 6/34 left leg pain, hematoma, patient denied trauma.  Imaging including plain film x-ray and lower extremity venous Doppler were unremarkable.  He presented again 7/7 with ongoing left leg pain and deep bruising.  Continued ongoing daily drinking.  History of severe delirium tremens.  EDP consulted with Dr. Ginette Pitman of orthopedics over the phone, he recommended admission to medicine, gentle Ace bandage, consult orthopedics again if needed.  Admitted for further evaluation of hematoma and monitoring of anemia.  A & P  Traumatic hematoma left leg with ongoing pain in the setting of end-stage liver disease, chronic thrombocytopenia, coagulopathy with elevated INR.  Occurred after the patient struck his thigh several times as an outpatient. Recent imaging 2 view femur x-ray and vascular ultrasound of the left leg were unremarkable. CT consistent with hematoma. --Orthopedics recommended ACE bandage, supportive care.   --Hemoglobin stable.  Clinically appears stable.  Expect spontaneous resolution.  Acute alcohol withdrawal with delirium --Inattentive, tremulous, reports hallucinations.  Continue to monitor and treat as needed based on CIWA  Acute blood loss anemia secondary to above --Hemoglobin stable.  No further evaluation suggested.  End-stage liver disease secondary to alcoholic cirrhosis with ongoing daily drinking, complicated by history of hepatitis B and hepatitis C; with associated thrombocytopenia, hyponatremia, hyperbilirubinemia --Possible component of alcoholic hepatitis.  Discriminant function 22.4.  No indication for steroids and would hold off given uncertain hepatitis B status.  Hepatitis B, C --Follow-up with gastroenterology as  an outpatient  PMH severe delirium tremens when withdrawing from alcohol (patient denies seizures) --Per chart the patient is interested in quitting drinking --Social work consulted again, may be able to participate in conversation today  Tobacco abuse --Nicotine patch   Continue to monitor alcohol withdrawal and treat as needed.  Needs PCP appointment prior to discharge, close outpatient follow-up.  Social work consult for substance abuse.  DVT prophylaxis: low plts Code Status: Full Family Communication: none Disposition Plan: home    Murray Hodgkins, MD  [facility] Direct contact: see [email or web address] (further directions at bottom of note if needed) 7PM-7AM contact night coverage as at bottom of note 12/31/2018, 11:41 AM  LOS: 2 days   Consultants  . None   Procedures  . None   Antibiotics  . None   Interval History/Subjective  Feels okay today.  Some pain in left flank.  No nausea or vomiting.  Still sleepy.  Some hallucinations.  Objective   Vitals:  Vitals:   12/31/18 0507 12/31/18 0832  BP: 139/88 (!) 153/113  Pulse: 70 76  Resp: 12 14  Temp: 98.4 F (36.9 C) 98.5 F (36.9 C)  SpO2: 97% 97%    Exam:  Constitutional:   . Appears calm, tremulous, mildly confused, inattentive, falls asleep easily ENMT:  . grossly normal hearing  Respiratory:  . CTA bilaterally, no w/r/r.  . Respiratory effort normal.  Cardiovascular:  . RRR, no m/r/g . No LE extremity edema   Musculoskeletal:  . Bilateral upper extremity tremors Skin:  . No significant change in left thigh ecchymosis.  Dorsalis pedis pulse left foot 2+.  Perfusion lower leg appears intact. Psychiatric:  . Mental status o Judgment and insight appear impaired o Oriented to self, hospital, not month or year.  Mildly confused.  I have personally reviewed the  following:   Today's Data  . Basic metabolic panel unremarkable  Lab Data  . SARS-CoV-2 negative . Hepatitis B surface antigen was  negative in 2017, hepatitis C was positive with viral load 388,000.  HIV was nonreactive 2017.  Micro Data   Imaging  . CT left leg showed large masslike area within the abductor muscles left lower extremity which could represent hematoma in the correct clinical setting.  No extravasation noted.  Underlying mass difficult to exclude.  If symptoms do not resolve, consider MRI as an outpatient.  Cardiology Data  . EKG sinus rhythm with PVCs, left atrial enlargement  Other Data   Scheduled Meds: . folic acid  1 mg Oral Daily  . multivitamin with minerals  1 tablet Oral Daily  . nicotine  21 mg Transdermal Daily  . senna  1 tablet Oral BID  . sertraline  100 mg Oral Daily  . sodium chloride flush  3 mL Intravenous Q12H  . thiamine  100 mg Oral Daily  . traZODone  50 mg Oral QHS   Continuous Infusions: . sodium chloride      Principal Problem:   Thigh hematoma, left, initial encounter Active Problems:   Alcohol abuse, daily use   Thrombocytopenia (HCC)   Hepatitis C   Hyponatremia   Alcoholic cirrhosis of liver (HCC)   Hematoma   Acute blood loss anemia   LOS: 2 days   How to contact the [facility] Attending or Consulting provider [place] or covering provider during after hours [place], for this patient?  1. Check the care team in [facility] and look for a) attending/consulting [facility] provider listed and b) the [facility] team listed 2. Log into [email or web address] and use [place]'s universal password to access. If you do not have the password, please contact the hospital operator. 3. Locate the [facility] provider you are looking for under [facility] and page to a number that you can be directly reached. 4. If you still have difficulty reaching the provider, please page the [facility] (Director on Call) for the Hospitalists listed on amion for assistance.

## 2018-12-31 NOTE — Plan of Care (Signed)
  Problem: Education: Goal: Knowledge of General Education information will improve Description Including pain rating scale, medication(s)/side effects and non-pharmacologic comfort measures Outcome: Progressing   Problem: Health Behavior/Discharge Planning: Goal: Ability to manage health-related needs will improve Outcome: Progressing   

## 2019-01-01 LAB — COMPREHENSIVE METABOLIC PANEL
ALT: 52 U/L — ABNORMAL HIGH (ref 0–44)
AST: 151 U/L — ABNORMAL HIGH (ref 15–41)
Albumin: 2.1 g/dL — ABNORMAL LOW (ref 3.5–5.0)
Alkaline Phosphatase: 155 U/L — ABNORMAL HIGH (ref 38–126)
Anion gap: 8 (ref 5–15)
BUN: 9 mg/dL (ref 6–20)
CO2: 21 mmol/L — ABNORMAL LOW (ref 22–32)
Calcium: 8.1 mg/dL — ABNORMAL LOW (ref 8.9–10.3)
Chloride: 104 mmol/L (ref 98–111)
Creatinine, Ser: 0.51 mg/dL — ABNORMAL LOW (ref 0.61–1.24)
GFR calc Af Amer: >60 mL/min (ref >60)
GFR calc non Af Amer: >60 mL/min (ref >60)
Glucose, Bld: 94 mg/dL (ref 70–99)
Potassium: 3.6 mmol/L (ref 3.5–5.1)
Sodium: 133 mmol/L — ABNORMAL LOW (ref 135–145)
Total Bilirubin: 8.4 mg/dL — ABNORMAL HIGH (ref 0.3–1.2)
Total Protein: 7.7 g/dL (ref 6.5–8.1)

## 2019-01-01 NOTE — Progress Notes (Signed)
Occupational Therapy Evaluation Patient Details Name: Antonio [name] MRN: [id number] DOB: 04-02-1971 Today's Date: 01/01/2019    History of Present Illness 48 year old man PMH alcohol abuse, alcoholic cirrhosis, hepatitis B, hepatitis C, end-stage liver disease, seen in the emergency department 6/30 left leg pain, hematoma, patient denied trauma.  Imaging including plain film x-ray and lower extremity venous Doppler were unremarkable.  He presented again 7/7 with ongoing left leg pain and deep bruising.  Continued ongoing daily drinking.  History of severe delirium tremens. Admitted 12/29/2018 for treatment of Traumatic hematoma left leg with ongoing pain in the setting of end-stage liver disease, chronic thrombocytopenia, coagulopathy with elevated INR and alcohol withdrawl   Clinical Impression   Pt provided PLOF. PTA, pt was living at home with his POA, Tomasa Hosteller, pt reports he was with ADL/IADL and functional mobility. Pt currently requires minguard-minA for ADL and functional mobility at RW level. Pt's cognitive limitations and physical limitations impact his safety and independence with ADL/IADL and functional mobility. Due to decline in current level of function, pt would benefit from acute OT to address established goals to facilitate safe D/C to venue listed below. At this time, recommend SNF follow-up. Will continue to follow acutely.     Follow Up Recommendations  SNF    Equipment Recommendations  None recommended by OT    Recommendations for Other Services       Precautions / Restrictions Precautions Precautions: Fall Restrictions Weight Bearing Restrictions: No      Mobility Bed Mobility Overal bed mobility: Needs Assistance Bed Mobility: Supine to Sit     Supine to sit: Min guard     General bed mobility comments: minguard for safety with transition to EOB;increased time and effort due to tremors  Transfers Overall transfer level: Needs assistance Equipment  used: Rolling walker (2 wheeled) Transfers: Sit to/from Stand Sit to Stand: Min guard;Min assist         General transfer comment: minguard-minA for safe use of RW and stability due to tremors    Balance Overall balance assessment: Needs assistance Sitting-balance support: Feet supported;Bilateral upper extremity supported Sitting balance-Leahy Scale: Poor     Standing balance support: Bilateral upper extremity supported Standing balance-Leahy Scale: Poor Standing balance comment: reliant on BUE support on RW;minA for safe use of RW                           ADL either performed or assessed with clinical judgement   ADL Overall ADL's : Needs assistance/impaired Eating/Feeding: Set up;Sitting   Grooming: Set up;Sitting Grooming Details (indicate cue type and reason): pt was setup with setup for oral care upon arrival  Upper Body Bathing: Set up;Sitting Upper Body Bathing Details (indicate cue type and reason): pt completed upper body bathing upon arrival  Lower Body Bathing: Min guard;Sit to/from stand Lower Body Bathing Details (indicate cue type and reason): minguard for stability sitting EOB and with sit<>stand Upper Body Dressing : Min guard;Sitting Upper Body Dressing Details (indicate cue type and reason): minguard for stability sitting EOB Lower Body Dressing: Min guard;Sit to/from stand Lower Body Dressing Details (indicate cue type and reason): pt able to figure 4 to adjust socks;minguard for stability  Toilet Transfer: Min guard;Minimal assistance;Ambulation Toilet Transfer Details (indicate cue type and reason): simulated sit<>stand from EOB to recliner with in room mobility;minguard-minA for stability and safe use of RW          Functional mobility during ADLs: Min guard;Minimal assistance;Rolling  walker General ADL Comments: minguard-minA for safe use of RW and for stability;pt with full body tremors impacting safety with functional mobility;cognitive  limitations impacting safety     Vision Baseline Vision/History: No visual deficits Patient Visual Report: No change from baseline       Perception     Praxis      Pertinent Vitals/Pain Pain Assessment: No/denies pain Pain Intervention(s): Monitored during session     Hand Dominance Right   Extremity/Trunk Assessment Upper Extremity Assessment Upper Extremity Assessment: Overall WFL for tasks assessed   Lower Extremity Assessment Lower Extremity Assessment: Defer to PT evaluation   Cervical / Trunk Assessment Cervical / Trunk Assessment: Normal   Communication Communication Communication: No difficulties   Cognition Arousal/Alertness: Awake/alert Behavior During Therapy: Flat affect Overall Cognitive Status: Impaired/Different from baseline Area of Impairment: Orientation;Following commands;Problem solving;Memory;Attention;Safety/judgement                 Orientation Level: Time;Situation Current Attention Level: Sustained Memory: Decreased short-term memory;Decreased recall of precautions Following Commands: Follows one step commands inconsistently;Follows multi-step commands inconsistently;Follows one step commands with increased time;Follows multi-step commands with increased time Safety/Judgement: Decreased awareness of safety;Decreased awareness of deficits   Problem Solving: Slow processing;Decreased initiation;Difficulty sequencing;Requires verbal cues;Requires tactile cues General Comments: pt reporting month was April, pt aware we are in a hospital;increased time for processing;pt requires minA for safe use of RW;pt impulsive    General Comments  HR 89bpm prior to functional mobliity increased to 115bpm following functional mobility;SpO2 RA >93%    Exercises     Shoulder Instructions      Home Living Family/patient expects to be discharged to:: Private residence Living Arrangements: Other (Comment)(pt reports living with Tomasa Hosteller his  POA) Available Help at Discharge: Other (Comment)(sponsor) Type of Home: House Home Access: Stairs to enter [employer] of Steps: 3 Entrance Stairs-Rails: Left Home Layout: One level     Bathroom Shower/Tub: Teacher, early years/pre: Standard Bathroom Accessibility: Yes How Accessible: Accessible via walker Home Equipment: Cane - single point          Prior Functioning/Environment Level of Independence: Independent with assistive device(s)                 OT Problem List: Decreased range of motion;Decreased activity tolerance;Impaired balance (sitting and/or standing);Decreased safety awareness;Decreased knowledge of use of DME or AE;Cardiopulmonary status limiting activity      OT Treatment/Interventions:      OT Goals(Current goals can be found in the care plan section) Acute Rehab OT Goals Patient Stated Goal: none stated OT Goal Formulation: With patient Time For Goal Achievement: 01/15/19 Potential to Achieve Goals: Good ADL Goals Pt Will Perform Grooming: Independently Pt Will Perform Upper Body Dressing: Independently Pt Will Perform Lower Body Dressing: Independently Pt Will Transfer to Toilet: Independently;ambulating  OT Frequency: Min 2X/week   Barriers to D/C:            Co-evaluation              AM-PAC OT "6 Clicks" Daily Activity     Outcome Measure Help from another person eating meals?: None Help from another person taking care of personal grooming?: A Little Help from another person toileting, which includes using toliet, bedpan, or urinal?: A Little Help from another person bathing (including washing, rinsing, drying)?: A Little Help from another person to put on and taking off regular upper body clothing?: A Little Help from another person to put on and taking off  regular lower body clothing?: A Little 6 Click Score: 19   End of Session Equipment Utilized During Treatment: Gait belt;Rolling walker Nurse  Communication: Mobility status  Activity Tolerance: Patient tolerated treatment well Patient left: in chair;with call bell/phone within reach;with chair alarm set  OT Visit Diagnosis: Unsteadiness on feet (R26.81);Other abnormalities of gait and mobility (R26.89);Adult, failure to thrive (R62.7);Other symptoms and signs involving cognitive function                Time: 2179-8102 OT Time Calculation (min): 20 min Charges:  OT General Charges $OT Visit: 1 Visit OT Evaluation $OT Eval Moderate Complexity: [place] OTR/L Acute Rehabilitation Services Office: [place] 01/01/2019, 10:33 AM
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
## 2019-01-02 NOTE — Progress Notes (Addendum)
CSW attempted on 2 occasions to address SNF consult however the 2 times CSW attempted to meet patient at bedside he was not able to meet. Once he was meeting with OT/PT, the other time patient was in the bathroom and appeared unstable at the time.  CSW will follow patient for discharge planning.

## 2019-01-02 NOTE — Progress Notes (Addendum)
Spoke with [employer], Pt's sponsor and POA. All questions answered. She appreciated for giving her update. CIWA assessment q 4 hrs. Pt was well compliant with care plan, moderate tremor, mild agitation and mild anxiety. He's alert and oriented x 4 with mild hallucination like see some objects at the door but unable to explain the description of it. However, Pt slept well tonight. Pain was well tolerated. His vital signs remained stable, no acute distress noted. Continue to monitor.  Kennyth Lose, RN

## 2019-01-03 DIAGNOSIS — F10232 Alcohol dependence with withdrawal with perceptual disturbance: Secondary | ICD-10-CM
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
## 2019-01-03 NOTE — Progress Notes (Signed)
Updates given to Janette, patient's POA.
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
## 2019-01-03 NOTE — TOC Progression Note (Signed)
Transition of Care [facility]) - Progression Note    Patient Details  Name: Antonio [name] MRN: [id number] Date of Birth: 1971-05-24  Transition of Care [facility]) CM/SW [place], [place] Phone Number: 01/03/2019, 4:04 PM  Clinical Narrative:     CSW unable to complete assessment- patient was sleep.  [name], MSW, [place] Clinical Social Worker [phone]        Expected Discharge Plan and Services                                                 Social Determinants of Health (SDOH) Interventions    Readmission Risk Interventions No flowsheet data found.

## 2019-01-03 NOTE — Progress Notes (Signed)
Physical Therapy Treatment Patient Details Name: Antonio [name] MRN: [id number] DOB: 04/15/1971 Today's Date: 01/03/2019    History of Present Illness 48 year old man PMH alcohol abuse, alcoholic cirrhosis, hepatitis B, hepatitis C, end-stage liver disease, seen in the emergency department 6/30 left leg pain, hematoma, patient denied trauma.  Imaging including plain film x-ray and lower extremity venous Doppler were unremarkable.  He presented again 7/7 with ongoing left leg pain and deep bruising.  Continued ongoing daily drinking.  History of severe delirium tremens. Admitted 12/29/2018 for treatment of Traumatic hematoma left leg with ongoing pain in the setting of end-stage liver disease, chronic thrombocytopenia, coagulopathy with elevated INR and alcohol withdrawl    PT Comments    Pt admitted with above diagnosis. Pt currently with functional limitations due to balance and endurance deficits. Pt was able to ambulate with RW with min to min guard assist with unsteady gait due to tremors.  Will continue to progress pt.  Pt will benefit from skilled PT to increase their independence and safety with mobility to allow discharge to the venue listed below.     Follow Up Recommendations  SNF;Other (comment)     Equipment Recommendations  Other (comment)(TBD at next venue)    Recommendations for Other Services OT consult     Precautions / Restrictions Precautions Precautions: Fall Restrictions Weight Bearing Restrictions: Yes LLE Weight Bearing: Weight bearing as tolerated    Mobility  Bed Mobility Overal bed mobility: Needs Assistance Bed Mobility: Supine to Sit     Supine to sit: Min guard     General bed mobility comments: minguard for safety with transition to EOB;increased time and effort due to tremors  Transfers Overall transfer level: Needs assistance Equipment used: Rolling walker (2 wheeled) Transfers: Sit to/from Stand Sit to Stand: Min guard;Min assist         General transfer comment: minguard-minA for safe use of RW and stability due to tremors  Ambulation/Gait Ambulation/Gait assistance: Min assist Gait Distance (Feet): 150 Feet Assistive device: Rolling walker (2 wheeled) Gait Pattern/deviations: Step-through pattern;Decreased stride length;Trunk flexed;Drifts right/left   Gait velocity interpretation: <1.31 ft/sec, indicative of household ambulator General Gait Details: Pt was able to ambulate with RW with tremors questionably due to alcohol withdrawal.  Pt shaky at times needing steadying assist.  Also needed encouragement to participate as he does not really want to mobilize.    Stairs             Wheelchair Mobility    Modified Rankin (Stroke Patients Only)       Balance Overall balance assessment: Needs assistance Sitting-balance support: Feet supported;No upper extremity supported Sitting balance-Leahy Scale: Fair     Standing balance support: Bilateral upper extremity supported Standing balance-Leahy Scale: Poor Standing balance comment: reliant on BUE support on RW;minA for safe use of RW                            Cognition Arousal/Alertness: Awake/alert Behavior During Therapy: Flat affect Overall Cognitive Status: Impaired/Different from baseline Area of Impairment: Orientation;Following commands;Problem solving;Memory;Attention;Safety/judgement                 Orientation Level: Situation;Disoriented to Current Attention Level: Sustained Memory: Decreased short-term memory;Decreased recall of precautions Following Commands: Follows one step commands inconsistently;Follows multi-step commands inconsistently;Follows one step commands with increased time;Follows multi-step commands with increased time Safety/Judgement: Decreased awareness of safety;Decreased awareness of deficits   Problem Solving: Slow processing;Decreased initiation;Difficulty sequencing;Requires verbal  cues;Requires  tactile cues        Exercises      General Comments General comments (skin integrity, edema, etc.): VSS      Pertinent Vitals/Pain Pain Assessment: Faces Faces Pain Scale: Hurts little more Pain Location: L LE with movement  Pain Descriptors / Indicators: Grimacing;Guarding Pain Intervention(s): Limited activity within patient's tolerance;Monitored during session;Premedicated before session;Repositioned    Home Living                      Prior Function            PT Goals (current goals can now be found in the care plan section) Acute Rehab PT Goals Patient Stated Goal: none stated Progress towards PT goals: Progressing toward goals    Frequency    Min 3X/week      PT Plan Current plan remains appropriate    Co-evaluation              AM-PAC PT "6 Clicks" Mobility   Outcome Measure  Help needed turning from your back to your side while in a flat bed without using bedrails?: None Help needed moving from lying on your back to sitting on the side of a flat bed without using bedrails?: A Little Help needed moving to and from a bed to a chair (including a wheelchair)?: A Lot Help needed standing up from a chair using your arms (e.g., wheelchair or bedside chair)?: A Lot Help needed to walk in hospital room?: A Lot Help needed climbing 3-5 steps with a railing? : Total 6 Click Score: 14    End of Session Equipment Utilized During Treatment: Gait belt Activity Tolerance: Treatment limited secondary to medical complications (Comment)(tremors) Patient left: with call bell/phone within reach;in bed;with bed alarm set Nurse Communication: Mobility status PT Visit Diagnosis: Unsteadiness on feet (R26.81);Other abnormalities of gait and mobility (R26.89);Muscle weakness (generalized) (M62.81);Difficulty in walking, not elsewhere classified (R26.2);Other symptoms and signs involving the nervous system (R29.898);Ataxic gait (R26.0)     Time: 0321-2248 PT  Time Calculation (min) (ACUTE ONLY): 12 min  Charges:  $Gait Training: 8-22 mins                     [place] Pager:  [phone]  Office:  [place] 01/03/2019, 1:05 PM

## 2019-01-04 LAB — HCV RNA QUANT
HCV Quantitative Log: 5.82 log10 IU/mL (ref >1.70)
HCV Quantitative: 660000 IU/mL (ref >50)

## 2019-01-04 NOTE — Progress Notes (Signed)
Updates given to [place], patient's POA.

## 2019-01-04 NOTE — TOC Progression Note (Signed)
Transition of Care [facility]) - Progression Note    Patient Details  Name: Antonio [name] MRN: [id number] Date of Birth: 1970/12/30  Transition of Care [facility]) CM/SW [place], [place] Phone Number: 01/04/2019, 4:04 PM  Clinical Narrative:    CSW visit with the patient at bedside. CSW was consulted for SNF placement, and substance abuse. CSW discussed with the patient PT recommendations of SNF placement before returning home. Patient states he was willing to consider SNF placement at this time. CSW explained the SNF process and some of the challenges with finding SNF placement( no insurance, substance use, etc) but CSW will keep him updated on the progress. Patient states he lives with Antonio [name] who is also his sponsor. Patient gave CSW permission to contact her and inform her of the same information. She understood challenges and indicated she retired from [employer] and his home 24/7, (except for her medical appointments) to support the patient. She explained  that she was working with [facility] financial Counselor/[place] to get the patient medicaid and disability started.  She expressed appreciation for CSW assistance and had no questions at this time.   Patient discussed with CSW his desire to stop drinking alcohol. Patient states he understands how alcohol was impacting his health. Patient states he has been drinking since the age of 79. He drinks a 12 pack per day. Patient states he has stopped before for about 2 months  but hanging  with his friends leads him back to drinking. CSW provided the patient with NA/AA meetings locations and inpatient and out patient alcohol and substance abuse treatment resources. Patient states no questions or concerns at this time.   [name], MSW, LCSWA Clinical Social Worker [phone]      Barriers to Discharge: Continued Medical Work up, Inadequate or no insurance, No SNF bed  Expected Discharge Plan and Services   In-house Referral:  Clinical Social Work     Living arrangements for the past 2 months: Single Family Home                                       Social Determinants of Health (SDOH) Interventions    Readmission Risk Interventions No flowsheet data found.
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
## 2019-01-05 NOTE — Progress Notes (Signed)
PROGRESS NOTE  Antonio [name] LNL:[id number] DOB: 1970-07-06 DOA: 12/28/2018 PCP: Patient, No Pcp Per  HPI/Recap of past 11 hours: 48 year old man PMH alcohol abuse, alcoholic cirrhosis, hepatitis B, hepatitis C, end-stage liver disease, seen in the emergency department 6/34 left leg pain, hematoma, patient denied trauma.  Imaging including plain film x-ray and lower extremity venous Doppler were unremarkable.  He presented again 7/7 with ongoing left leg pain and deep bruising.  Continued ongoing daily drinking.  History of severe delirium tremens.  EDP consulted with Dr. Ginette Pitman of orthopedics over the phone, he recommended admission to medicine, gentle Ace bandage, consult orthopedics again if needed.  Admitted for further evaluation of hematoma and monitoring of anemia.  01/05/19: Patient was seen and examined at his bedside.  He reports his L thigh hematoma is improved.  Pain is well controlled.  According to him the left LLE hematoma was spontaneous.  Recalls trauma to RLE in the past.   Assessment/Plan: Principal Problem:   Thigh hematoma, left, initial encounter Active Problems:   Alcohol abuse, daily use   Thrombocytopenia (HCC)   Hepatitis C   Hyponatremia   Alcoholic cirrhosis of liver (HCC)   Hematoma   Acute blood loss anemia  Left lower extremity hematoma in the setting of end-stage liver disease and coagulopathy, POA Improving per the patient Last INR 1.5  Acute alcohol withdrawal with delirium Continues to improve Continue multivitamins, folic acid and thiamine  End-stage liver disease in the setting of alcoholic cirrhosis, hepatitis B and C Ongoing alcohol use Monitor LFTs and coagulopathy  Coagulopathy in the setting of end-stage liver disease Last INR 1.5  Hepatitis B and C Follow-up with gastroenterology outpatient  Tobacco use disorder Continue nicotine patch  Generalized weakness/physical debility PT recommended SNF Fall precautions CSW assisting  with SNF replacement   DVT prophylaxis:  SCDs. Code Status: Full Family Communication:  Will call family if okay with the patient. Disposition Plan: SNF when stable   Consultants   None   Procedures   None   Antibiotics   None        Objective: Vitals:   01/04/19 2159 01/04/19 2346 01/05/19 0448 01/05/19 0713  BP: 117/74 131/80 120/82 127/85  Pulse: 67 68 63   Resp: 12 13 12 13   Temp: 99.1 F (37.3 C) 99.6 F (37.6 C) 99.3 F (37.4 C) 99.1 F (37.3 C)  TempSrc: Oral  Oral Oral  SpO2: 100% 97% 98% 97%  Weight:      Height:        Intake/Output Summary (Last 24 hours) at 01/05/2019 1016 Last data filed at 01/05/2019 0818 Gross per 24 hour  Intake 440 ml  Output 1525 ml  Net -1085 ml   Filed Weights   12/28/18 1610 12/29/18 0108  Weight: 71.7 kg 75.1 kg    Exam:  . General: 48 y.o. year-old male well developed well nourished in no acute distress.  Alert and oriented x3. . Cardiovascular: Regular rate and rhythm with no rubs or gallops.  No thyromegaly or JVD noted.   Marland Kitchen Respiratory: Clear to auscultation with no wheezes or rales. Good inspiratory effort. . Abdomen: Soft nontender nondistended with normal bowel sounds x4 quadrants. . Musculoskeletal: Mild left thigh hematoma noted, appears to be improving.  2/4 pulses in all 4 extremities. Marland Kitchen Psychiatry: Mood is appropriate for condition and setting   Data Reviewed: CBC: Recent Labs  Lab 12/30/18 0851  WBC 10.6*  HGB 9.4*  HCT 27.5*  MCV 92.3  PLT 99*   Basic Metabolic Panel: Recent Labs  Lab 12/31/18 0744 01/01/19 0258  NA 133* 133*  K 4.7 3.6  CL 107 104  CO2 20* 21*  GLUCOSE 72 94  BUN 11 9  CREATININE 0.59* 0.51*  CALCIUM 7.8* 8.1*   GFR: Estimated Creatinine Clearance: 106.7 mL/min (A) (by C-G formula based on SCr of 0.51 mg/dL (L)). Liver Function Tests: Recent Labs  Lab 01/01/19 0258  AST 151*  ALT 52*  ALKPHOS 155*  BILITOT 8.4*  PROT 7.7  ALBUMIN 2.1*   No  results for input(s): LIPASE, AMYLASE in the last 168 hours. No results for input(s): AMMONIA in the last 168 hours. Coagulation Profile: No results for input(s): INR, PROTIME in the last 168 hours. Cardiac Enzymes: No results for input(s): CKTOTAL, CKMB, CKMBINDEX, TROPONINI in the last 168 hours. BNP (last 3 results) No results for input(s): PROBNP in the last 8760 hours. HbA1C: No results for input(s): HGBA1C in the last 72 hours. CBG: No results for input(s): GLUCAP in the last 168 hours. Lipid Profile: No results for input(s): CHOL, HDL, LDLCALC, TRIG, CHOLHDL, LDLDIRECT in the last 72 hours. Thyroid Function Tests: No results for input(s): TSH, T4TOTAL, FREET4, T3FREE, THYROIDAB in the last 72 hours. Anemia Panel: No results for input(s): VITAMINB12, FOLATE, FERRITIN, TIBC, IRON, RETICCTPCT in the last 72 hours. Urine analysis:    Component Value Date/Time   COLORURINE AMBER (A) 02/12/2016 1700   APPEARANCEUR CLEAR 02/12/2016 1700   LABSPEC 1.023 02/12/2016 1700   PHURINE 6.5 02/12/2016 1700   GLUCOSEU NEGATIVE 02/12/2016 1700   HGBUR NEGATIVE 02/12/2016 1700   BILIRUBINUR SMALL (A) 02/12/2016 1700   KETONESUR 40 (A) 02/12/2016 1700   PROTEINUR NEGATIVE 02/12/2016 1700   UROBILINOGEN 1.0 10/04/2012 0320   NITRITE NEGATIVE 02/12/2016 1700   LEUKOCYTESUR TRACE (A) 02/12/2016 1700   Sepsis Labs: @LABRCNTIP (procalcitonin:4,lacticidven:4)  ) Recent Results (from the past 240 hour(s))  SARS Coronavirus 2 (CEPHEID- Performed in [place] hospital lab), Hosp Order     Status: None   Collection Time: 12/28/18  9:26 PM   Specimen: Nasopharyngeal Swab  Result Value Ref Range Status   SARS Coronavirus 2 NEGATIVE NEGATIVE Final    Comment: (NOTE) If result is NEGATIVE SARS-CoV-2 target nucleic acids are NOT DETECTED. The SARS-CoV-2 RNA is generally detectable in upper and lower  respiratory specimens during the acute phase of infection. The lowest  concentration of  SARS-CoV-2 viral copies this assay can detect is 250  copies / mL. A negative result does not preclude SARS-CoV-2 infection  and should not be used as the sole basis for treatment or other  patient management decisions.  A negative result may occur with  improper specimen collection / handling, submission of specimen other  than nasopharyngeal swab, presence of viral mutation(s) within the  areas targeted by this assay, and inadequate number of viral copies  (<250 copies / mL). A negative result must be combined with clinical  observations, patient history, and epidemiological information. If result is POSITIVE SARS-CoV-2 target nucleic acids are DETECTED. The SARS-CoV-2 RNA is generally detectable in upper and lower  respiratory specimens dur ing the acute phase of infection.  Positive  results are indicative of active infection with SARS-CoV-2.  Clinical  correlation with patient history and other diagnostic information is  necessary to determine patient infection status.  Positive results do  not rule out bacterial infection or co-infection with other viruses. If result is PRESUMPTIVE POSTIVE SARS-CoV-2 nucleic acids MAY BE  PRESENT.   A presumptive positive result was obtained on the submitted specimen  and confirmed on repeat testing.  While 2019 novel coronavirus  (SARS-CoV-2) nucleic acids may be present in the submitted sample  additional confirmatory testing may be necessary for epidemiological  and / or clinical management purposes  to differentiate between  SARS-CoV-2 and other Sarbecovirus currently known to infect humans.  If clinically indicated additional testing with an alternate test  methodology [phone]) is advised. The SARS-CoV-2 RNA is generally  detectable in upper and lower respiratory sp ecimens during the acute  phase of infection. The expected result is Negative. Fact Sheet for Patients:  StrictlyIdeas.no Fact Sheet for Healthcare  Providers: BankingDealers.co.za This test is not yet approved or cleared by the [place] FDA and has been authorized for detection and/or diagnosis of SARS-CoV-2 by FDA under an Emergency Use Authorization (EUA).  This EUA will remain in effect (meaning this test can be used) for the duration of the COVID-19 declaration under Section 564(b)(1) of the Act, 21 U.S.C. section 360bbb-3(b)(1), unless the authorization is terminated or revoked sooner. Performed at [facility], [place] [address] [place], [place] [address]       Studies: No results found.  Scheduled Meds: . folic acid  1 mg Oral Daily  . multivitamin with minerals  1 tablet Oral Daily  . nicotine  21 mg Transdermal Daily  . senna  1 tablet Oral BID  . sertraline  100 mg Oral Daily  . sodium chloride flush  3 mL Intravenous Q12H  . thiamine  100 mg Oral Daily    Continuous Infusions: . sodium chloride       LOS: 7 days     Kayleen Memos, MD [facility] Pager [phone]  If 7PM-7AM, please contact night-coverage [email or web address] Password [facility] 01/05/2019, 10:16 AM

## 2019-01-05 NOTE — Progress Notes (Signed)
Physical Therapy Treatment Patient Details Name: Antonio [name] MRN: [id number] DOB: Nov 14, 1970 Today's Date: 01/05/2019    History of Present Illness 48 year old man PMH alcohol abuse, alcoholic cirrhosis, hepatitis B, hepatitis C, end-stage liver disease, seen in the emergency department 6/30 left leg pain, hematoma, patient denied trauma.  Imaging including plain film x-ray and lower extremity venous Doppler were unremarkable.  He presented again 7/7 with ongoing left leg pain and deep bruising.  Continued ongoing daily drinking.  History of severe delirium tremens. Admitted 12/29/2018 for treatment of Traumatic hematoma left leg with ongoing pain in the setting of end-stage liver disease, chronic thrombocytopenia, coagulopathy with elevated INR and alcohol withdrawl    PT Comments    Pt agreeable to ambulation with therapy today. Pt continues to have tremors but they have greatly improved since evaluation. Pt's tremors in the presence of decreased safety awareness and decreased strength and endurance continue to limit pt safe mobility. Pt is min guard for bed mobility and transfers and minA progressing to modA (due to fatigue and muscle weakness) with ambulation of 250 feet with RW. With return to room OT waiting to work with pt on grooming and bathing. PT will continue to follow acutely.     Follow Up Recommendations  SNF     Equipment Recommendations  Other (comment)(TBD at next venue)       Precautions / Restrictions Precautions Precautions: Fall Restrictions Weight Bearing Restrictions: Yes LLE Weight Bearing: Weight bearing as tolerated    Mobility  Bed Mobility Overal bed mobility: Needs Assistance Bed Mobility: Supine to Sit     Supine to sit: Min guard     General bed mobility comments: minguard for safety with transition to EOB, decreased awareness of call bell wrapped around his waist limiting his movement  Transfers Overall transfer level: Needs  assistance Equipment used: Rolling walker (2 wheeled) Transfers: Sit to/from Stand Sit to Stand: Min guard         General transfer comment: min guard for safety with power up and steadying with RW  Ambulation/Gait Ambulation/Gait assistance: Min assist;Mod assist Gait Distance (Feet): 250 Feet Assistive device: Rolling walker (2 wheeled) Gait Pattern/deviations: Step-through pattern;Decreased stride length;Trunk flexed;Drifts right/left Gait velocity: slowed Gait velocity interpretation: <1.8 ft/sec, indicate of risk for recurrent falls General Gait Details: Pt continues to have minor tremors contributing to decreased safety with ambulation. min A for steadying wtih RW, vc for proximity to RW, pt with c/o of stretching pain behind bilateral knees that decreased with distance. In last 25 feet of ambulation pt visibly fatigued and has buckling in L knee requiring modA to steady       Balance Overall balance assessment: Needs assistance Sitting-balance support: Feet supported;No upper extremity supported Sitting balance-Leahy Scale: Fair     Standing balance support: Bilateral upper extremity supported Standing balance-Leahy Scale: Poor Standing balance comment: reliant on BUE support on RW;minA for safe use of RW                            Cognition Arousal/Alertness: Awake/alert Behavior During Therapy: Flat affect Overall Cognitive Status: Impaired/Different from baseline Area of Impairment: Following commands;Problem solving;Memory;Safety/judgement;Attention                   Current Attention Level: Alternating Memory: Decreased short-term memory;Decreased recall of precautions Following Commands: Follows multi-step commands inconsistently;Follows multi-step commands with increased time Safety/Judgement: Decreased awareness of safety;Decreased awareness of deficits   Problem Solving:  Slow processing;Decreased initiation;Difficulty sequencing;Requires  verbal cues;Requires tactile cues General Comments: Improving cognition with improved command following         General Comments General comments (skin integrity, edema, etc.): Pt with minor tremoring throughout treatment session       Pertinent Vitals/Pain Pain Assessment: Faces Faces Pain Scale: Hurts little more Pain Location: L LE with movement  Pain Descriptors / Indicators: Grimacing;Guarding Pain Intervention(s): Limited activity within patient's tolerance;Monitored during session;Repositioned           PT Goals (current goals can now be found in the care plan section) Acute Rehab PT Goals Patient Stated Goal: none stated PT Goal Formulation: Patient unable to participate in goal setting Time For Goal Achievement: 01/14/19 Potential to Achieve Goals: Fair Progress towards PT goals: Progressing toward goals    Frequency    Min 3X/week      PT Plan Current plan remains appropriate    Co-evaluation              AM-PAC PT "6 Clicks" Mobility   Outcome Measure  Help needed turning from your back to your side while in a flat bed without using bedrails?: None Help needed moving from lying on your back to sitting on the side of a flat bed without using bedrails?: None Help needed moving to and from a bed to a chair (including a wheelchair)?: A Little Help needed standing up from a chair using your arms (e.g., wheelchair or bedside chair)?: A Little Help needed to walk in hospital room?: A Lot Help needed climbing 3-5 steps with a railing? : Total 6 Click Score: 17    End of Session Equipment Utilized During Treatment: Gait belt Activity Tolerance: Patient tolerated treatment well(tremors) Patient left: Other (comment)(left sitting in front of sink with OT) Nurse Communication: Mobility status PT Visit Diagnosis: Unsteadiness on feet (R26.81);Other abnormalities of gait and mobility (R26.89);Muscle weakness (generalized) (M62.81);Difficulty in walking, not  elsewhere classified (R26.2);Other symptoms and signs involving the nervous system (R29.898);Ataxic gait (R26.0)     Time: 7591-6384 PT Time Calculation (min) (ACUTE ONLY): 12 min  Charges:  $Gait Training: 8-22 mins                     Zackaria Burkey B. Migdalia Dk PT, DPT Acute Rehabilitation Services Pager [phone] Office [phone]    [place] 01/05/2019, 11:19 AM

## 2019-01-05 NOTE — Progress Notes (Signed)
Occupational Therapy Treatment Patient Details Name: Antonio [name] MRN: [id number] DOB: February 28, 1971 Today's Date: 01/05/2019    History of present illness 48 year old man PMH alcohol abuse, alcoholic cirrhosis, hepatitis B, hepatitis C, end-stage liver disease, seen in the emergency department 6/30 left leg pain, hematoma, patient denied trauma.  Imaging including plain film x-ray and lower extremity venous Doppler were unremarkable.  He presented again 7/7 with ongoing left leg pain and deep bruising.  Continued ongoing daily drinking.  History of severe delirium tremens. Admitted 12/29/2018 for treatment of Traumatic hematoma left leg with ongoing pain in the setting of end-stage liver disease, chronic thrombocytopenia, coagulopathy with elevated INR and alcohol withdrawl   OT comments  Pt progressing towards OT goals this session. Pt met walking in hallway with PT, required seated position for grooming tasks at sink due to fatigue and pain in LLE. Pt able to complete with set up. Pt min A with RW for short ambulation from sink>toilet>recliner with 2 LOB (posterior) requiring therapist intervention and continued decreased safety awareness of deficits. OT will continue to follow acutely- Pt requires SNF level care to continue therapy and for safety  Follow Up Recommendations  SNF    Equipment Recommendations  None recommended by OT    Recommendations for Other Services      Precautions / Restrictions Precautions Precautions: Fall Restrictions Weight Bearing Restrictions: Yes LLE Weight Bearing: Weight bearing as tolerated       Mobility Bed Mobility               General bed mobility comments: not observed this session  Transfers Overall transfer level: Needs assistance Equipment used: Rolling walker (2 wheeled) Transfers: Sit to/from Stand Sit to Stand: Min guard         General transfer comment: min guard for safety with power up and steadying with RW    Balance  Overall balance assessment: Needs assistance Sitting-balance support: Feet supported;No upper extremity supported Sitting balance-Leahy Scale: Fair     Standing balance support: Bilateral upper extremity supported Standing balance-Leahy Scale: Poor Standing balance comment: reliant on BUE support on RW;minA for safe use of RW                           ADL either performed or assessed with clinical judgement   ADL Overall ADL's : Needs assistance/impaired     Grooming: Wash/dry hands;Wash/dry face;Oral care;Set up;Sitting Grooming Details (indicate cue type and reason): fatigued from walking in hallway with PT Upper Body Bathing: Set up;Sitting Upper Body Bathing Details (indicate cue type and reason): seated at sink             Toilet Transfer: Minimal assistance;RW;Ambulation;Regular Toilet;Grab bars           Functional mobility during ADLs: Minimal assistance;Rolling walker;Cueing for safety(LOB x2 posterior)       Vision       Perception     Praxis      Cognition Arousal/Alertness: Awake/alert Behavior During Therapy: Flat affect Overall Cognitive Status: Impaired/Different from baseline Area of Impairment: Following commands;Problem solving;Memory;Safety/judgement;Attention                   Current Attention Level: Alternating Memory: Decreased short-term memory;Decreased recall of precautions Following Commands: Follows multi-step commands inconsistently;Follows multi-step commands with increased time Safety/Judgement: Decreased awareness of safety;Decreased awareness of deficits   Problem Solving: Slow processing;Decreased initiation;Difficulty sequencing;Requires verbal cues;Requires tactile cues General Comments: Improving cognition with improved command  following        Exercises     Shoulder Instructions       General Comments      Pertinent Vitals/ Pain       Pain Assessment: Faces Faces Pain Scale: Hurts little  more Pain Location: L LE with movement/WB Pain Descriptors / Indicators: Grimacing;Guarding Pain Intervention(s): Limited activity within patient's tolerance;Monitored during session;Repositioned  Home Living                                          Prior Functioning/Environment              Frequency  Min 2X/week        Progress Toward Goals  OT Goals(current goals can now be found in the care plan section)  Progress towards OT goals: Progressing toward goals  Acute Rehab OT Goals Patient Stated Goal: go home instead of SNF OT Goal Formulation: With patient Time For Goal Achievement: 01/15/19 Potential to Achieve Goals: Good  Plan Discharge plan remains appropriate    Co-evaluation                 AM-PAC OT "6 Clicks" Daily Activity     Outcome Measure   Help from another person eating meals?: None Help from another person taking care of personal grooming?: A Little Help from another person toileting, which includes using toliet, bedpan, or urinal?: A Little Help from another person bathing (including washing, rinsing, drying)?: A Little Help from another person to put on and taking off regular upper body clothing?: A Little Help from another person to put on and taking off regular lower body clothing?: A Little 6 Click Score: 19    End of Session Equipment Utilized During Treatment: Gait belt;Rolling walker  OT Visit Diagnosis: Unsteadiness on feet (R26.81);Other abnormalities of gait and mobility (R26.89);Adult, failure to thrive (R62.7);Other symptoms and signs involving cognitive function   Activity Tolerance Patient tolerated treatment well   Patient Left in chair;with call bell/phone within reach;with chair alarm set   Nurse Communication Mobility status        Time: 3125-0871 OT Time Calculation (min): 20 min  Charges: OT General Charges $OT Visit: 1 Visit OT Treatments $Self Care/Home Management : 8-22 mins Hulda Humphrey OTR/L Acute Rehabilitation Services Pager: [phone] Office: [place] 01/05/2019, 1:54 PM

## 2019-01-05 NOTE — NC FL2 (Signed)
[place] LEVEL OF CARE SCREENING TOOL     IDENTIFICATION  Patient Name: Antonio [name] Birthdate: [birth year] Sex: male Admission Date (Current Location): 12/28/2018  [facility] and [place] Number:  Herbalist and Address:  [facility], [place] [address], [place], [place] [address]      Provider Number: [id number]  Attending Physician Name and Address:  Kayleen Memos, DO  Relative Name and Phone Number:  Lavella Lemons [phone] or [phone]    Current Level of Care: Hospital Recommended Level of Care: [place] Prior Approval Number:    Date Approved/Denied:   PASRR Number: [id number]  Discharge Plan: SNF    Current Diagnoses: Patient Active Problem List   Diagnosis Date Noted  . Acute blood loss anemia 12/29/2018  . Hyponatremia 12/28/2018  . Thigh hematoma, left, initial encounter 12/28/2018  . Alcoholic cirrhosis of liver ([place]) 12/28/2018  . Hematoma 12/28/2018  . Pancreatitis, acute 02/16/2016  . Chest pain 02/13/2016  . Alcoholic gastritis 35/46/5681  . 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 Syncope 02/12/2016  . Opioid dependence ([place]) 10/06/2012  . Unspecified episodic mood disorder 10/06/2012  . Alcohol dependence ([place]) 10/06/2012  . Polysubstance abuse ([place]) 02/24/2012  . Chronic back pain 02/24/2012  . Withdrawal from opioids ([place]) 02/24/2012  . 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 Thrombocytopenia ([place]) 02/24/2012  . Neutropenia- low relative neutrophil count 02/24/2012  . Dehydration with hypernatremia 02/24/2012  . Elevated CPK 02/24/2012  . Metabolic encephalopathy 2/2 alcohol and opiods/dehydration 02/24/2012  . Depression 02/24/2012  . Hepatitis B 02/24/2012  . Hepatitis C 02/24/2012  . Alcohol abuse, daily use 06/02/2011    Orientation RESPIRATION BLADDER Height & Weight     Self, Time, Situation, Place  Normal Incontinent Weight: 165 lb 9.1 oz (75.1 kg) Height:  5\' 7"  (170.2  cm)  BEHAVIORAL SYMPTOMS/MOOD NEUROLOGICAL BOWEL NUTRITION STATUS      Continent Diet(please see discharged summary)  AMBULATORY STATUS COMMUNICATION OF NEEDS Skin   Limited Assist Verbally Bruising(ecchymosis, eczema, right arm, abdomen, left leg)                       Personal Care Assistance Level of Assistance  Bathing, Feeding, Dressing Bathing Assistance: Limited assistance Feeding assistance: Independent Dressing Assistance: Limited assistance     Functional Limitations Info  Sight, Hearing, Speech Sight Info: Adequate Hearing Info: Adequate Speech Info: Adequate    SPECIAL CARE FACTORS FREQUENCY  OT (By licensed OT), PT (By licensed PT)     PT Frequency: 3x per week OT Frequency: 3x per week            Contractures Contractures Info: Not present    Additional Factors Info  Code Status, Allergies Code Status Info: FULL Allergies Info: Fish Allergy,Sulfa Antibiotics,Bee Venom,Latex           Current Medications (01/05/2019):  This is the current hospital active medication list Current Facility-Administered Medications  Medication Dose Route Frequency Provider Last Rate Last Dose  . 0.9 %  sodium chloride infusion  250 mL Intravenous PRN Doutova, Anastassia, MD      . folic acid (FOLVITE) tablet 1 mg  1 mg Oral Daily Doutova, Anastassia, MD   1 mg at 01/05/19 0829  . LORazepam (ATIVAN) injection 2-3 mg  2-3 mg Intravenous Q1H PRN Toy Baker, MD   2 mg at 01/02/19 1825  . multivitamin with minerals tablet 1 tablet  1 tablet Oral Daily Murray Hodgkins  P, MD   1 tablet at 01/05/19 0829  . nicotine (NICODERM CQ - dosed in mg/24 hours) patch 21 mg  21 mg Transdermal Daily Doutova, Anastassia, MD   21 mg at 01/05/19 0830  . ondansetron (ZOFRAN) tablet 4 mg  4 mg Oral Q6H PRN Toy Baker, MD       Or  . ondansetron (ZOFRAN) injection 4 mg  4 mg Intravenous Q6H PRN Doutova, Anastassia, MD      . senna (SENOKOT) tablet 8.6 mg  1 tablet Oral BID  Toy Baker, MD   8.6 mg at 01/04/19 2127  . sertraline (ZOLOFT) tablet 100 mg  100 mg Oral Daily Doutova, Anastassia, MD   100 mg at 01/05/19 0829  . sodium chloride flush (NS) 0.9 % injection 3 mL  3 mL Intravenous Q12H Doutova, Anastassia, MD   3 mL at 01/05/19 0830  . sodium chloride flush (NS) 0.9 % injection 3 mL  3 mL Intravenous PRN Doutova, Anastassia, MD      . thiamine (VITAMIN B-1) tablet 100 mg  100 mg Oral Daily Doutova, Anastassia, MD   100 mg at 01/05/19 4462     Discharge Medications: Please see discharge summary for a list of discharge medications.  Relevant Imaging Results:  Relevant Lab Results:   Additional Information SSN# [id number]  Vinie Sill, LCSWA

## 2019-01-06 LAB — COMPREHENSIVE METABOLIC PANEL
ALT: 71 U/L — ABNORMAL HIGH (ref 0–44)
AST: 154 U/L — ABNORMAL HIGH (ref 15–41)
Albumin: 2.1 g/dL — ABNORMAL LOW (ref 3.5–5.0)
Alkaline Phosphatase: 160 U/L — ABNORMAL HIGH (ref 38–126)
Anion gap: 9 (ref 5–15)
BUN: 10 mg/dL (ref 6–20)
CO2: 21 mmol/L — ABNORMAL LOW (ref 22–32)
Calcium: 8.5 mg/dL — ABNORMAL LOW (ref 8.9–10.3)
Chloride: 107 mmol/L (ref 98–111)
Creatinine, Ser: 0.58 mg/dL — ABNORMAL LOW (ref 0.61–1.24)
GFR calc Af Amer: >60 mL/min (ref >60)
GFR calc non Af Amer: >60 mL/min (ref >60)
Glucose, Bld: 77 mg/dL (ref 70–99)
Potassium: 4.2 mmol/L (ref 3.5–5.1)
Sodium: 137 mmol/L (ref 135–145)
Total Bilirubin: 5.9 mg/dL — ABNORMAL HIGH (ref 0.3–1.2)
Total Protein: 7.5 g/dL (ref 6.5–8.1)

## 2019-01-06 LAB — CBC
HCT: 35.5 % — ABNORMAL LOW (ref 39.0–52.0)
Hemoglobin: 11.7 g/dL — ABNORMAL LOW (ref 13.0–17.0)
MCH: 31.1 pg (ref 26.0–34.0)
MCHC: 33 g/dL (ref 30.0–36.0)
MCV: 94.4 fL (ref 80.0–100.0)
Platelets: 148 10*3/uL — ABNORMAL LOW (ref 150–400)
RBC: 3.76 MIL/uL — ABNORMAL LOW (ref 4.22–5.81)
RDW: 23.7 % — ABNORMAL HIGH (ref 11.5–15.5)
WBC: 9 10*3/uL (ref 4.0–10.5)
nRBC: 0 % (ref 0.0–0.2)

## 2019-01-06 NOTE — Progress Notes (Signed)
PROGRESS NOTE  Garrie Woodin HUT:[id number] DOB: Jun 10, 1971 DOA: 12/28/2018 PCP: Patient, No Pcp Per  HPI/Recap of past 64 hours: 48 year old man PMH alcohol abuse, alcoholic cirrhosis, hepatitis B, hepatitis C, end-stage liver disease, seen in the emergency department 6/34 left leg pain, hematoma, patient denied trauma.  Imaging including plain film x-ray and lower extremity venous Doppler were unremarkable.  He presented again 7/7 with ongoing left leg pain and deep bruising.  Continued ongoing daily drinking.  History of severe delirium tremens.  EDP consulted with Dr. Ginette Pitman of orthopedics over the phone, he recommended admission to medicine, gentle Ace bandage, consult orthopedics again if needed.  Admitted for further evaluation of hematoma and monitoring of anemia.  01/06/19: Patient was seen and examined at his bedside.  No acute events overnight.  Reports intermittentleft thigh pain at site of hematoma.    Assessment/Plan: Principal Problem:   Thigh hematoma, left, initial encounter Active Problems:   Alcohol abuse, daily use   Thrombocytopenia (HCC)   Hepatitis C   Hyponatremia   Alcoholic cirrhosis of liver (HCC)   Hematoma   Acute blood loss anemia  Left lower extremity hematoma in the setting of end-stage liver disease and coagulopathy, POA Improving Optimize pain control  Resolving acute alcohol withdrawal with delirium Continue multivitamins, folic acid and thiamine  End-stage liver disease in the setting of alcoholic cirrhosis, hepatitis B and C Ongoing alcohol use Monitor LFTs and coagulopathy  Coagulopathy in the setting of end-stage liver disease Last INR 1.5  Hepatitis B and C Follow-up with gastroenterology outpatient  Tobacco use disorder Continue nicotine patch  Generalized weakness/physical debility PT recommended SNF Patient declines SNF but will be an unsafe discharge as the patient does not have 24-hour supervision.  Continue fall  precautions CSW is assisting with placement.   DVT prophylaxis:  SCDs. Code Status: Full Family Communication:  Will call family if okay with the patient. Disposition Plan:  Undetermined.  CSW assisting with placement   Consultants   None   Procedures   None   Antibiotics   None        Objective: Vitals:   01/05/19 2333 01/06/19 0526 01/06/19 0736 01/06/19 1216  BP: (!) 139/91 127/83 116/77 116/74  Pulse: (!) 57 62 64 65  Resp: 11 10 18 18   Temp: 98.8 F (37.1 C) 98.9 F (37.2 C) 98.9 F (37.2 C) 99.6 F (37.6 C)  TempSrc: Oral Oral Oral Oral  SpO2: 99% 100% 100% 98%  Weight:      Height:        Intake/Output Summary (Last 24 hours) at 01/06/2019 1338 Last data filed at 01/06/2019 1217 Gross per 24 hour  Intake 720 ml  Output 575 ml  Net 145 ml   Filed Weights   12/28/18 1610 12/29/18 0108  Weight: 71.7 kg 75.1 kg    Exam:  . General: 48 y.o. year-old male well developed well-nourished in no acute distress.  Alert and oriented x3.   . Cardiovascular: Regular rate and rhythm.  No rubs or gallops.  No JVD or thyromegaly.   Marland Kitchen Respiratory: Clear to auscultation.  No wheezes or rales.  Good respiratory effort.. . Abdomen: Soft nontender nondistended with normal bowel sounds x4.. . Musculoskeletal: Mild left thigh hematoma noted.  Mild tenderness to palpation noted. Marland Kitchen Psychiatry: Mood is appropriate for condition and setting.  Data Reviewed: CBC: Recent Labs  Lab 01/06/19 0544  WBC 9.0  HGB 11.7*  HCT 35.5*  MCV 94.4  PLT 148*  Basic Metabolic Panel: Recent Labs  Lab 12/31/18 0744 01/01/19 0258 01/06/19 0544  NA 133* 133* 137  K 4.7 3.6 4.2  CL 107 104 107  CO2 20* 21* 21*  GLUCOSE 72 94 77  BUN 11 9 10   CREATININE 0.59* 0.51* 0.58*  CALCIUM 7.8* 8.1* 8.5*   GFR: Estimated Creatinine Clearance: 106.7 mL/min (A) (by C-G formula based on SCr of 0.58 mg/dL (L)). Liver Function Tests: Recent Labs  Lab 01/01/19 0258 01/06/19 0544   AST 151* 154*  ALT 52* 71*  ALKPHOS 155* 160*  BILITOT 8.4* 5.9*  PROT 7.7 7.5  ALBUMIN 2.1* 2.1*   No results for input(s): LIPASE, AMYLASE in the last 168 hours. No results for input(s): AMMONIA in the last 168 hours. Coagulation Profile: No results for input(s): INR, PROTIME in the last 168 hours. Cardiac Enzymes: No results for input(s): CKTOTAL, CKMB, CKMBINDEX, TROPONINI in the last 168 hours. BNP (last 3 results) No results for input(s): PROBNP in the last 8760 hours. HbA1C: No results for input(s): HGBA1C in the last 72 hours. CBG: No results for input(s): GLUCAP in the last 168 hours. Lipid Profile: No results for input(s): CHOL, HDL, LDLCALC, TRIG, CHOLHDL, LDLDIRECT in the last 72 hours. Thyroid Function Tests: No results for input(s): TSH, T4TOTAL, FREET4, T3FREE, THYROIDAB in the last 72 hours. Anemia Panel: No results for input(s): VITAMINB12, FOLATE, FERRITIN, TIBC, IRON, RETICCTPCT in the last 72 hours. Urine analysis:    Component Value Date/Time   COLORURINE AMBER (A) 02/12/2016 1700   APPEARANCEUR CLEAR 02/12/2016 1700   LABSPEC 1.023 02/12/2016 1700   PHURINE 6.5 02/12/2016 1700   GLUCOSEU NEGATIVE 02/12/2016 1700   HGBUR NEGATIVE 02/12/2016 1700   BILIRUBINUR SMALL (A) 02/12/2016 1700   KETONESUR 40 (A) 02/12/2016 1700   PROTEINUR NEGATIVE 02/12/2016 1700   UROBILINOGEN 1.0 10/04/2012 0320   NITRITE NEGATIVE 02/12/2016 1700   LEUKOCYTESUR TRACE (A) 02/12/2016 1700   Sepsis Labs: @LABRCNTIP (procalcitonin:4,lacticidven:4)  ) Recent Results (from the past 240 hour(s))  SARS Coronavirus 2 (CEPHEID- Performed in [place] hospital lab), Hosp Order     Status: None   Collection Time: 12/28/18  9:26 PM   Specimen: Nasopharyngeal Swab  Result Value Ref Range Status   SARS Coronavirus 2 NEGATIVE NEGATIVE Final    Comment: (NOTE) If result is NEGATIVE SARS-CoV-2 target nucleic acids are NOT DETECTED. The SARS-CoV-2 RNA is generally detectable in  upper and lower  respiratory specimens during the acute phase of infection. The lowest  concentration of SARS-CoV-2 viral copies this assay can detect is 250  copies / mL. A negative result does not preclude SARS-CoV-2 infection  and should not be used as the sole basis for treatment or other  patient management decisions.  A negative result may occur with  improper specimen collection / handling, submission of specimen other  than nasopharyngeal swab, presence of viral mutation(s) within the  areas targeted by this assay, and inadequate number of viral copies  (<250 copies / mL). A negative result must be combined with clinical  observations, patient history, and epidemiological information. If result is POSITIVE SARS-CoV-2 target nucleic acids are DETECTED. The SARS-CoV-2 RNA is generally detectable in upper and lower  respiratory specimens dur ing the acute phase of infection.  Positive  results are indicative of active infection with SARS-CoV-2.  Clinical  correlation with patient history and other diagnostic information is  necessary to determine patient infection status.  Positive results do  not rule out bacterial infection or  co-infection with other viruses. If result is PRESUMPTIVE POSTIVE SARS-CoV-2 nucleic acids MAY BE PRESENT.   A presumptive positive result was obtained on the submitted specimen  and confirmed on repeat testing.  While 2019 novel coronavirus  (SARS-CoV-2) nucleic acids may be present in the submitted sample  additional confirmatory testing may be necessary for epidemiological  and / or clinical management purposes  to differentiate between  SARS-CoV-2 and other Sarbecovirus currently known to infect humans.  If clinically indicated additional testing with an alternate test  methodology [phone]) is advised. The SARS-CoV-2 RNA is generally  detectable in upper and lower respiratory sp ecimens during the acute  phase of infection. The expected result is  Negative. Fact Sheet for Patients:  StrictlyIdeas.no Fact Sheet for Healthcare Providers: BankingDealers.co.za This test is not yet approved or cleared by the [place] FDA and has been authorized for detection and/or diagnosis of SARS-CoV-2 by FDA under an Emergency Use Authorization (EUA).  This EUA will remain in effect (meaning this test can be used) for the duration of the COVID-19 declaration under Section 564(b)(1) of the Act, 21 U.S.C. section 360bbb-3(b)(1), unless the authorization is terminated or revoked sooner. Performed at [facility], [place] [address], [place], [place] [address]       Studies: No results found.  Scheduled Meds: . folic acid  1 mg Oral Daily  . multivitamin with minerals  1 tablet Oral Daily  . nicotine  21 mg Transdermal Daily  . senna  1 tablet Oral BID  . sertraline  100 mg Oral Daily  . sodium chloride flush  3 mL Intravenous Q12H  . thiamine  100 mg Oral Daily    Continuous Infusions: . sodium chloride       LOS: 8 days     Kayleen Memos, MD [facility] Pager [phone]  If 7PM-7AM, please contact night-coverage [email or web address] Password [facility] 01/06/2019, 1:38 PM

## 2019-01-07 MED ORDER — OXYCODONE HCL 5 MG PO TABS: 5.0000 mg | ORAL_TABLET | Freq: Three times a day (TID) | ORAL | Status: DC | PRN

## 2019-01-07 MED ORDER — TRAZODONE HCL 50 MG PO TABS
50.0000 mg | ORAL_TABLET | Freq: Once | ORAL | Status: AC
  Administered 2019-01-07: 50 mg via ORAL
  Filled 2019-01-07: qty 1

## 2019-01-07 NOTE — Plan of Care (Signed)
  Problem: Health Behavior/Discharge Planning: Goal: Ability to manage health-related needs will improve 01/07/2019 2233 by Drenda Freeze, RN Outcome: Progressing 01/07/2019 2229 by Drenda Freeze, RN Outcome: Progressing

## 2019-01-07 NOTE — Progress Notes (Signed)
PROGRESS NOTE  Shannon Kirkendall CMK:[id number] DOB: [birth year] DOA: 12/28/2018 PCP: Patient, No Pcp Per  HPI/Recap of past 38 hours: 48 year old man PMH alcohol abuse, alcoholic cirrhosis, hepatitis B, hepatitis C, end-stage liver disease, seen in the emergency department 6/34 left leg pain, hematoma, patient denied trauma.  Imaging including plain film x-ray and lower extremity venous Doppler were unremarkable.  He presented again 7/7 with ongoing left leg pain and deep bruising.  Continued ongoing daily drinking.  History of severe delirium tremens.  EDP consulted with Dr. Ginette Pitman of orthopedics over the phone, he recommended admission to medicine, gentle Ace bandage, consult orthopedics again if needed.  Admitted for further evaluation of hematoma and monitoring of anemia.  Hospital course complicated by generalized weakness.  PT assessed and recommended SNF.  Patient declined SNF and wants to go home.  01/07/19: Patient was seen and examined at his bedside.  No acute events overnight.  He has no new complaints.  Plan is to discharge home with home health services tomorrow 01/08/2019.  01/06/19: Patient was seen and examined at his bedside.  No acute events overnight.  Reports intermittentleft thigh pain at site of hematoma.  CSW's note: Patient states  his sponsor, Dereck Ligas, will be there in the home  to provide the needed support.  CSW called and spoke with Jannett and she expressed hopefully the patient can discharge after today, which would allow her to have things in place at home for the patient. She explained most of today she will be in [facility] attending her medical appointments with the [place]. She will be transporting the patient home.    Assessment/Plan: Principal Problem:   Thigh hematoma, left, initial encounter Active Problems:   Alcohol abuse, daily use   Thrombocytopenia (HCC)   Hepatitis C   Hyponatremia   Alcoholic cirrhosis of liver (HCC)   Hematoma   Acute blood loss anemia   Left lower extremity hematoma in the setting of end-stage liver disease and coagulopathy, POA Improving Optimize pain control with ambulation and physical therapy On oxycodone 5 mg every 6 hours as needed for severe pain and breakthrough pain Senokot 2 tablets daily for bowel regimen  Resolving acute alcohol withdrawal with delirium Continue multivitamins, folic acid and thiamine  End-stage liver disease in the setting of alcoholic cirrhosis, hepatitis B and C Ongoing alcohol use Alcohol cessation counseled on at bedside LFTs are uptrending Will need follow-up appointment with GI outpatient  Coagulopathy in the setting of end-stage liver disease Last INR 1.5  Hepatitis B and C Follow-up with gastroenterology outpatient  Tobacco use disorder Continue nicotine patch  Generalized weakness/physical debility PT recommended SNF Patient declines SNF He wants to go home.  Possible discharge to home with home health services tomorrow 01/08/2019.   Continue fall precautions    DVT prophylaxis:  SCDs. Code Status: Full Family Communication:  Will call family if okay with the patient. Disposition Plan: Possible discharge to home with home health services tomorrow 01/08/2019.  Please read statement per CSW above.   Consultants   None   Procedures   None   Antibiotics   None        Objective: Vitals:   01/06/19 1936 01/06/19 2353 01/07/19 0518 01/07/19 0758  BP: 127/84 (!) 147/80 (!) 124/91 129/85  Pulse: (!) 55 (!) 52 60 64  Resp: 14 11 19 18   Temp: 98.9 F (37.2 C) 98.7 F (37.1 C) 98.4 F (36.9 C) 99.1 F (37.3 C)  TempSrc: Oral Oral Oral Oral  SpO2: 100% 99% 100% 100%  Weight:      Height:        Intake/Output Summary (Last 24 hours) at 01/07/2019 1142 Last data filed at 01/07/2019 0813 Gross per 24 hour  Intake 1320 ml  Output 1275 ml  Net 45 ml   Filed Weights   12/28/18 1610 12/29/18 0108  Weight: 71.7 kg 75.1 kg    Exam:  . General: 48  y.o. year-old male WD WN NAD A&O x 3 . Cardiovascular:RRR no rubs or gallops. No JVD or thyromegaly . Respiratory: CTA no wheezes or rales. Good inspiratory efforts . Abdomen: soft NT ND NBS x 4 . Musculoskeletal: Left thigh hematoma improving, 2/4 pulses in all 4 extremities. Marland Kitchen Psychiatry: Mood is appropriate.  Data Reviewed: CBC: Recent Labs  Lab 01/06/19 0544  WBC 9.0  HGB 11.7*  HCT 35.5*  MCV 94.4  PLT 017*   Basic Metabolic Panel: Recent Labs  Lab 01/01/19 0258 01/06/19 0544  NA 133* 137  K 3.6 4.2  CL 104 107  CO2 21* 21*  GLUCOSE 94 77  BUN 9 10  CREATININE 0.51* 0.58*  CALCIUM 8.1* 8.5*   GFR: Estimated Creatinine Clearance: 106.7 mL/min (A) (by C-G formula based on SCr of 0.58 mg/dL (L)). Liver Function Tests: Recent Labs  Lab 01/01/19 0258 01/06/19 0544  AST 151* 154*  ALT 52* 71*  ALKPHOS 155* 160*  BILITOT 8.4* 5.9*  PROT 7.7 7.5  ALBUMIN 2.1* 2.1*   No results for input(s): LIPASE, AMYLASE in the last 168 hours. No results for input(s): AMMONIA in the last 168 hours. Coagulation Profile: No results for input(s): INR, PROTIME in the last 168 hours. Cardiac Enzymes: No results for input(s): CKTOTAL, CKMB, CKMBINDEX, TROPONINI in the last 168 hours. BNP (last 3 results) No results for input(s): PROBNP in the last 8760 hours. HbA1C: No results for input(s): HGBA1C in the last 72 hours. CBG: No results for input(s): GLUCAP in the last 168 hours. Lipid Profile: No results for input(s): CHOL, HDL, LDLCALC, TRIG, CHOLHDL, LDLDIRECT in the last 72 hours. Thyroid Function Tests: No results for input(s): TSH, T4TOTAL, FREET4, T3FREE, THYROIDAB in the last 72 hours. Anemia Panel: No results for input(s): VITAMINB12, FOLATE, FERRITIN, TIBC, IRON, RETICCTPCT in the last 72 hours. Urine analysis:    Component Value Date/Time   COLORURINE AMBER (A) 02/12/2016 1700   APPEARANCEUR CLEAR 02/12/2016 1700   LABSPEC 1.023 02/12/2016 1700   PHURINE 6.5  02/12/2016 1700   GLUCOSEU NEGATIVE 02/12/2016 1700   HGBUR NEGATIVE 02/12/2016 1700   BILIRUBINUR SMALL (A) 02/12/2016 1700   KETONESUR 40 (A) 02/12/2016 1700   PROTEINUR NEGATIVE 02/12/2016 1700   UROBILINOGEN 1.0 10/04/2012 0320   NITRITE NEGATIVE 02/12/2016 1700   LEUKOCYTESUR TRACE (A) 02/12/2016 1700   Sepsis Labs: @LABRCNTIP (procalcitonin:4,lacticidven:4)  ) Recent Results (from the past 240 hour(s))  SARS Coronavirus 2 (CEPHEID- Performed in [place] hospital lab), Hosp Order     Status: None   Collection Time: 12/28/18  9:26 PM   Specimen: Nasopharyngeal Swab  Result Value Ref Range Status   SARS Coronavirus 2 NEGATIVE NEGATIVE Final    Comment: (NOTE) If result is NEGATIVE SARS-CoV-2 target nucleic acids are NOT DETECTED. The SARS-CoV-2 RNA is generally detectable in upper and lower  respiratory specimens during the acute phase of infection. The lowest  concentration of SARS-CoV-2 viral copies this assay can detect is 250  copies / mL. A negative result does not preclude SARS-CoV-2 infection  and should  not be used as the sole basis for treatment or other  patient management decisions.  A negative result may occur with  improper specimen collection / handling, submission of specimen other  than nasopharyngeal swab, presence of viral mutation(s) within the  areas targeted by this assay, and inadequate number of viral copies  (<250 copies / mL). A negative result must be combined with clinical  observations, patient history, and epidemiological information. If result is POSITIVE SARS-CoV-2 target nucleic acids are DETECTED. The SARS-CoV-2 RNA is generally detectable in upper and lower  respiratory specimens dur ing the acute phase of infection.  Positive  results are indicative of active infection with SARS-CoV-2.  Clinical  correlation with patient history and other diagnostic information is  necessary to determine patient infection status.  Positive results do   not rule out bacterial infection or co-infection with other viruses. If result is PRESUMPTIVE POSTIVE SARS-CoV-2 nucleic acids MAY BE PRESENT.   A presumptive positive result was obtained on the submitted specimen  and confirmed on repeat testing.  While 2019 novel coronavirus  (SARS-CoV-2) nucleic acids may be present in the submitted sample  additional confirmatory testing may be necessary for epidemiological  and / or clinical management purposes  to differentiate between  SARS-CoV-2 and other Sarbecovirus currently known to infect humans.  If clinically indicated additional testing with an alternate test  methodology [phone]) is advised. The SARS-CoV-2 RNA is generally  detectable in upper and lower respiratory sp ecimens during the acute  phase of infection. The expected result is Negative. Fact Sheet for Patients:  StrictlyIdeas.no Fact Sheet for Healthcare Providers: BankingDealers.co.za This test is not yet approved or cleared by the [place] FDA and has been authorized for detection and/or diagnosis of SARS-CoV-2 by FDA under an Emergency Use Authorization (EUA).  This EUA will remain in effect (meaning this test can be used) for the duration of the COVID-19 declaration under Section 564(b)(1) of the Act, 21 U.S.C. section 360bbb-3(b)(1), unless the authorization is terminated or revoked sooner. Performed at [facility], [place] [address] [place], [place] [address]       Studies: No results found.  Scheduled Meds: . folic acid  1 mg Oral Daily  . multivitamin with minerals  1 tablet Oral Daily  . nicotine  21 mg Transdermal Daily  . senna  1 tablet Oral BID  . sertraline  100 mg Oral Daily  . sodium chloride flush  3 mL Intravenous Q12H  . thiamine  100 mg Oral Daily    Continuous Infusions: . sodium chloride       LOS: 9 days     Kayleen Memos, MD [facility] Pager [phone]  If  7PM-7AM, please contact night-coverage [email or web address] Password [facility] 01/07/2019, 11:42 AM

## 2019-01-07 NOTE — Plan of Care (Signed)
  Problem: Health Behavior/Discharge Planning: Goal: Ability to manage health-related needs will improve Outcome: Progressing   Problem: Clinical Measurements: Goal: Ability to maintain clinical measurements within normal limits will improve Outcome: Progressing   

## 2019-01-07 NOTE — Progress Notes (Signed)
Pt ambulated 470 ft using RW.  Pace slow and steady, no rests, prominent limp.  Asked for pain meds after walk, explained non ordered and he accepted.  Will con't plan of care.

## 2019-01-07 NOTE — TOC Progression Note (Signed)
Transition of Care [facility]) - Progression Note    Patient Details  Name: Antonio [name] MRN: [id number] Date of Birth: 1970/10/08  [facility]) CM/SW [place], [place] Phone Number: 01/07/2019, 11:14 AM  Clinical Narrative:     CSW visited with the patient at bedside. Patient refused SNF placement at this time. Patient states his preference is to go home. Patient states  his sponsor, Antonio [name], will be there in the home  to provide the needed support.  CSW called and spoke with Jannett and she expressed hopefully the patient can discharge after today, which would allow her to have things in place at home for the patient. She explained most of today she will be in [facility] attending her medical appointments with the [place]. She will be transporting the patient home.   [name], MSW, LCSWA Clinical Social Worker [phone]     Barriers to Discharge: Continued Medical Work up, Inadequate or no insurance, No SNF bed  Expected Discharge Plan and Services   In-house Referral: Clinical Social Work     Living arrangements for the past 2 months: Single Family Home                                       Social Determinants of Health (SDOH) Interventions    Readmission Risk Interventions No flowsheet data found.

## 2019-01-07 NOTE — Progress Notes (Signed)
Physical Therapy Treatment Patient Details Name: Antonio [name] MRN: [id number] DOB: [birth year] Today's Date: 01/07/2019    History of Present Illness 48 year old man PMH alcohol abuse, alcoholic cirrhosis, hepatitis B, hepatitis C, end-stage liver disease, seen in the emergency department 6/30 left leg pain, hematoma, patient denied trauma.  Imaging including plain film x-ray and lower extremity venous Doppler were unremarkable.  He presented again 7/7 with ongoing left leg pain and deep bruising.  Continued ongoing daily drinking.  History of severe delirium tremens. Admitted 12/29/2018 for treatment of Traumatic hematoma left leg with ongoing pain in the setting of end-stage liver disease, chronic thrombocytopenia, coagulopathy with elevated INR and alcohol withdrawl    PT Comments    Pt admitted with above diagnosis. Pt currently with functional limitations due to balance and endurance deficits. Pt was able to ambulate with min guard assist with RW with better stability today.  Pt in much better spirits as well.  Progressing with mobility.   Pt will benefit from skilled PT to increase their independence and safety with mobility to allow discharge to the venue listed below.     Follow Up Recommendations  SNF     Equipment Recommendations  Other (comment)(TBD at next venue)    Recommendations for Other Services OT consult     Precautions / Restrictions Precautions Precautions: Fall Precaution Comments: DTs Restrictions Weight Bearing Restrictions: Yes LLE Weight Bearing: Weight bearing as tolerated    Mobility  Bed Mobility Overal bed mobility: Needs Assistance Bed Mobility: Supine to Sit     Supine to sit: Supervision        Transfers Overall transfer level: Needs assistance Equipment used: Rolling walker (2 wheeled) Transfers: Sit to/from Stand Sit to Stand: Min guard Stand pivot transfers: Min guard       General transfer comment: min guard for safety and  steadying with RW  Ambulation/Gait Ambulation/Gait assistance: Min guard Gait Distance (Feet): 250 Feet Assistive device: Rolling walker (2 wheeled) Gait Pattern/deviations: Step-through pattern;Decreased stride length;Drifts right/left Gait velocity: slowed Gait velocity interpretation: <1.8 ft/sec, indicate of risk for recurrent falls General Gait Details: Pt continues to have minor tremors But did not affect ambulation. Much steadier with RW, vc needed for proximity to RW at times, pt with c/o of stretching pain behind bilateral knees that decreased with distance.    Stairs             Wheelchair Mobility    Modified Rankin (Stroke Patients Only)       Balance Overall balance assessment: Needs assistance Sitting-balance support: Feet supported;No upper extremity supported Sitting balance-Leahy Scale: Fair     Standing balance support: Bilateral upper extremity supported Standing balance-Leahy Scale: Poor Standing balance comment: reliant on BUE support on RW                            Cognition Arousal/Alertness: Awake/alert Behavior During Therapy: Flat affect Overall Cognitive Status: Impaired/Different from baseline Area of Impairment: Following commands;Problem solving;Memory;Safety/judgement;Attention                   Current Attention Level: Alternating Memory: Decreased short-term memory Following Commands: Follows multi-step commands inconsistently;Follows multi-step commands with increased time Safety/Judgement: Decreased awareness of deficits Awareness: Emergent Problem Solving: Requires verbal cues General Comments: Improving cognition with improved command following      Exercises General Exercises - Lower Extremity Ankle Circles/Pumps: AROM;Both;10 reps;Supine Long Arc Quad: AROM;Both;10 reps;Seated Other Exercises Other Exercises: Showed pt  heel cord stretch and hamstring stretch with sheet as he c/o left LE pain back of  leg.      General Comments        Pertinent Vitals/Pain Pain Assessment: Faces Faces Pain Scale: Hurts little more Pain Location: L LE with movement/WB Pain Descriptors / Indicators: Grimacing;Guarding Pain Intervention(s): Limited activity within patient's tolerance;Monitored during session;Repositioned    Home Living                      Prior Function            PT Goals (current goals can now be found in the care plan section) Acute Rehab PT Goals Patient Stated Goal: go home instead of SNF Progress towards PT goals: Progressing toward goals    Frequency    Min 3X/week      PT Plan Current plan remains appropriate    Co-evaluation              AM-PAC PT "6 Clicks" Mobility   Outcome Measure  Help needed turning from your back to your side while in a flat bed without using bedrails?: None Help needed moving from lying on your back to sitting on the side of a flat bed without using bedrails?: None Help needed moving to and from a bed to a chair (including a wheelchair)?: A Little Help needed standing up from a chair using your arms (e.g., wheelchair or bedside chair)?: A Little Help needed to walk in hospital room?: A Little Help needed climbing 3-5 steps with a railing? : A Lot 6 Click Score: 19    End of Session Equipment Utilized During Treatment: Gait belt Activity Tolerance: Patient tolerated treatment well(tremors) Patient left: in bed;with call bell/phone within reach Nurse Communication: Mobility status;Patient requests pain meds PT Visit Diagnosis: Unsteadiness on feet (R26.81);Other abnormalities of gait and mobility (R26.89);Muscle weakness (generalized) (M62.81);Difficulty in walking, not elsewhere classified (R26.2);Other symptoms and signs involving the nervous system (R29.898);Ataxic gait (R26.0)     Time: 4818-5631 PT Time Calculation (min) (ACUTE ONLY): 16 min  Charges:  $Gait Training: 8-22 mins                     [place] Pager:  [phone]  Office:  [place] 01/07/2019, 1:55 PM

## 2019-01-08 DIAGNOSIS — F10230 Alcohol dependence with withdrawal, uncomplicated: Secondary | ICD-10-CM
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
## 2019-01-09 MED ORDER — THIAMINE HCL 100 MG PO TABS: 100.0000 mg | ORAL_TABLET | Freq: Every day | ORAL | 2 refills | Status: DC

## 2019-01-09 MED ORDER — FOLIC ACID 1 MG PO TABS: 1.0000 mg | ORAL_TABLET | Freq: Every day | ORAL | 1 refills | Status: DC

## 2019-01-09 MED ORDER — ADULT MULTIVITAMIN W/MINERALS CH: 1.0000 | ORAL_TABLET | Freq: Every day | ORAL | 1 refills | Status: DC

## 2019-01-09 NOTE — Progress Notes (Signed)
01/09/2019 1:18 PM Discharge AVS meds taken today and those due this evening reviewed.  Follow-up appointments and when to call md reviewed.  D/C IV and TELE.  Questions and concerns addressed.   D/C home per orders. [place]
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
## 2019-01-09 NOTE — Care Management (Deleted)
Clinic resources listed on AVS for patient to call for appointment.  Prescriptions are inexpensive and not covered by [facility].

## 2019-01-09 NOTE — TOC Transition Note (Signed)
Transition of Care [facility]) - CM/SW Discharge Note   Patient Details  Name: Marquarius Lofton MRN: [id number] Date of Birth: 01-10-1971  Transition of Care [facility]) CM/SW Contact:  Claudie Leach, RN Phone Number: 01/09/2019, 11:38 AM   Clinical Narrative:   Pt to d/c home with [facility].  PT/OT arranged with [facility], charity provider.  Pt states he does not need DME.  Clinic resources listed on AVS for patient to call for appointment.  Prescriptions are inexpensive and not covered by [facility].   Final next level of care: [place] Barriers to Discharge: Barriers Resolved   Patient Goals and CMS Choice Patient states their goals for this hospitalization and ongoing recovery are:: stay home until I am able to feel better and travel    Discharge Plan and Services In-house Referral: Clinical Social Work       HH Arranged: PT, OT [place] Agency: [facility] [employer] [facility] [employer] Date [place]: 01/09/19 Time [place]: 1105 Representative spoke with at [place]: Alwyn Ren

## 2019-01-26 ENCOUNTER — Encounter: Payer: Self-pay | Admitting: Family Medicine

## 2019-01-26 ENCOUNTER — Other Ambulatory Visit: Payer: Self-pay

## 2019-01-26 ENCOUNTER — Ambulatory Visit (INDEPENDENT_AMBULATORY_CARE_PROVIDER_SITE_OTHER): Payer: Self-pay | Admitting: Family Medicine

## 2019-01-26 VITALS — BP 117/73 | HR 71 | Temp 98.3°F | Resp 16 | Ht 65.0 in | Wt 163.0 lb

## 2019-01-26 DIAGNOSIS — F32A Depression, unspecified: Secondary | ICD-10-CM

## 2019-01-26 DIAGNOSIS — F1021 Alcohol dependence, in remission: Secondary | ICD-10-CM

## 2019-01-26 DIAGNOSIS — B1910 Unspecified viral hepatitis B without hepatic coma: Secondary | ICD-10-CM

## 2019-01-26 DIAGNOSIS — B182 Chronic viral hepatitis C: Secondary | ICD-10-CM

## 2019-01-26 DIAGNOSIS — D696 Thrombocytopenia, unspecified: Secondary | ICD-10-CM

## 2019-01-26 DIAGNOSIS — F329 Major depressive disorder, single episode, unspecified: Secondary | ICD-10-CM

## 2019-01-26 DIAGNOSIS — K7030 Alcoholic cirrhosis of liver without ascites: Secondary | ICD-10-CM

## 2019-01-26 DIAGNOSIS — D62 Acute posthemorrhagic anemia: Secondary | ICD-10-CM

## 2019-01-26 NOTE — Progress Notes (Signed)
Patient [facility]  New Patient Encounter Provider: Lanae Boast, [place]    MVH:[id number]  [id number]:[id number]  DOB - [birth year]  SUBJECTIVE:   Antonio [name], is a 48 y.o. male who presents to establish care with this clinic.   Current problems/concerns:  Patient is originally from [place]. Has been in the [place] since age 65.  Patient hospitalized 12/28/2018-01/09/2019 due to hematoma of the left thigh. Diagnosed with alcoholic cirrhosis of the liver, acute anemia from blood loss. He denies having alcohol since discharge. He states that he lives with his [employer] sponsor. He does have a history of depression. He reports that he was seeing a man in a yellow shirt and blue jeans. He reports that he does not know who the man is, but he is about 47 year old. He sees him mainly at night and has not seen him  in the past few days.  Patient states that he was started on zoloft in the hospital but was instructed to stop on discharge.  Today, he denies psychosis, delusional thinking, suicidal/homicidal ideations, intent or plan.  Allergies  Allergen Reactions  . Fish Allergy Itching and Swelling  . Sulfa Antibiotics Swelling  . Bee Venom Swelling  . Latex    Past Medical History:  Diagnosis Date  . Alcoholic ([place])    in recovery since 02/2018  . Asthma   . Complication of anesthesia    hard to wake up  . Depression 09/17/2018   h/o suicidal ideation, previously homeless.  . Hepatitis    hep b and c  . Polysubstance overdose   . Ruptured spleen    Current Outpatient Medications on File Prior to Visit  Medication Sig Dispense Refill  . folic acid (FOLVITE) 1 MG tablet Take 1 tablet (1 mg total) by mouth daily. 30 tablet 1  . Multiple Vitamin (MULTIVITAMIN WITH MINERALS) TABS tablet Take 1 tablet by mouth daily. 30 tablet 1  . thiamine 100 MG tablet Take 1 tablet (100 mg total) by mouth daily. 30 tablet 2   No current facility-administered medications  on file prior to visit.    Family History  Problem Relation Age of Onset  . Hepatitis Mother   . Alcohol abuse Brother    Social History   Socioeconomic History  . Marital status: Single    Spouse name: Not on file  . Number of children: Not on file  . Years of education: Not on file  . Highest education level: Not on file  Occupational History  . Not on file  Social Needs  . Financial resource strain: Not on file  . Food insecurity    Worry: Not on file    Inability: Not on file  . Transportation needs    Medical: Not on file    Non-medical: Not on file  Tobacco Use  . Smoking status: Current Every Day Smoker    Packs/day: 1.00    Years: 15.00    Pack years: 15.00    Types: Cigarettes  . Smokeless tobacco: Never Used  Substance and Sexual Activity  . Alcohol use: Yes    Alcohol/week: 84.0 standard drinks    Types: 84 Cans of beer per week    Comment: in recovery since 02/2018  . Drug use: Not Currently    Frequency: 28.0 times per week    Types: Hydrocodone, Heroin    Comment: heroin quit 5 years  . Sexual activity: Not Currently  Lifestyle  . Physical  activity    Days per week: Not on file    Minutes per session: Not on file  . Stress: Not on file  Relationships  . Social Herbalist on phone: Not on file    Gets together: Not on file    Attends religious service: Not on file    Active member of club or organization: Not on file    Attends meetings of clubs or organizations: Not on file    Relationship status: Not on file  . Intimate partner violence    Fear of current or ex partner: Not on file    Emotionally abused: Not on file    Physically abused: Not on file    Forced sexual activity: Not on file  Other Topics Concern  . Not on file  Social History Narrative  . Not on file    ROS   OBJECTIVE:    BP 117/73 (BP Location: Left Arm, Patient Position: Sitting, Cuff Size: Normal)   Pulse 71   Temp 98.3 F (36.8 C) (Oral)   Resp 16    Ht 5\' 5"  (1.651 m)   Wt 163 lb (73.9 kg)   SpO2 100%   BMI 27.12 kg/m   Physical Exam   ASSESSMENT/PLAN: 1. Alcoholic cirrhosis, unspecified whether ascites present ([place]) Continue to monitor. Encourged to continue abstinence from alcohol.   2. Alcohol dependence in remission ([place])  3. Chronic hepatitis C without hepatic coma (HCC) - CBC with Differential  4. Hepatitis B infection without delta agent without hepatic coma, unspecified chronicity  5. Thrombocytopenia ([place])  6. Acute blood loss anemia - CBC with Differential - Comprehensive metabolic panel  7. Depression, unspecified depression type Patient denies SI, HI, AH, VH.  - Ambulatory referral to [place]   Return in about 3 months (around 04/28/2019) for f/u.  The patient was given clear instructions to go to ER or return to medical center if symptoms don't improve, worsen or new problems develop. The patient verbalized understanding. The patient was told to call to get lab results if they haven't heard anything in the next week.     This note has been created with Surveyor, quantity. Any transcriptional errors are unintentional.   Ms. Andr L. Nathaneil Canary, FNP-BC [facility] [address] [place], [place] [address] [phone]

## 2019-01-26 NOTE — Patient Instructions (Signed)

## 2019-01-27 LAB — CBC WITH DIFFERENTIAL/PLATELET
Basophils Absolute: 0.1 10*3/uL (ref 0.0–0.2)
Basos: 1 %
EOS (ABSOLUTE): 0.9 10*3/uL — ABNORMAL HIGH (ref 0.0–0.4)
Eos: 13 %
Hematocrit: 30.6 % — ABNORMAL LOW (ref 37.5–51.0)
Hemoglobin: 11.3 g/dL — ABNORMAL LOW (ref 13.0–17.7)
Immature Grans (Abs): 0 10*3/uL (ref 0.0–0.1)
Immature Granulocytes: 1 %
Lymphocytes Absolute: 2.5 10*3/uL (ref 0.7–3.1)
Lymphs: 37 %
MCH: 32.1 pg (ref 26.6–33.0)
MCHC: 36.9 g/dL — ABNORMAL HIGH (ref 31.5–35.7)
MCV: 87 fL (ref 79–97)
Monocytes Absolute: 1.2 10*3/uL — ABNORMAL HIGH (ref 0.1–0.9)
Monocytes: 18 %
Neutrophils Absolute: 2 10*3/uL (ref 1.4–7.0)
Neutrophils: 30 %
Platelets: 110 10*3/uL — ABNORMAL LOW (ref 150–450)
RBC: 3.52 x10E6/uL — ABNORMAL LOW (ref 4.14–5.80)
RDW: 15.5 % — ABNORMAL HIGH (ref 11.6–15.4)
WBC: 6.7 10*3/uL (ref 3.4–10.8)

## 2019-01-27 LAB — COMPREHENSIVE METABOLIC PANEL
ALT: 34 IU/L (ref 0–44)
AST: 63 IU/L — ABNORMAL HIGH (ref 0–40)
Albumin/Globulin Ratio: 0.7 — ABNORMAL LOW (ref 1.2–2.2)
Albumin: 2.9 g/dL — ABNORMAL LOW (ref 4.0–5.0)
Alkaline Phosphatase: 221 IU/L — ABNORMAL HIGH (ref 39–117)
BUN/Creatinine Ratio: 18 (ref 9–20)
BUN: 11 mg/dL (ref 6–24)
Bilirubin Total: 3 mg/dL — ABNORMAL HIGH (ref 0.0–1.2)
CO2: 20 mmol/L (ref 20–29)
Calcium: 8.4 mg/dL — ABNORMAL LOW (ref 8.7–10.2)
Chloride: 107 mmol/L — ABNORMAL HIGH (ref 96–106)
Creatinine, Ser: 0.61 mg/dL — ABNORMAL LOW (ref 0.76–1.27)
GFR calc Af Amer: 137 mL/min/{1.73_m2} (ref >59)
GFR calc non Af Amer: 119 mL/min/{1.73_m2} (ref >59)
Globulin, Total: 3.9 g/dL (ref 1.5–4.5)
Glucose: 117 mg/dL — ABNORMAL HIGH (ref 65–99)
Potassium: 3.9 mmol/L (ref 3.5–5.2)
Sodium: 139 mmol/L (ref 134–144)
Total Protein: 6.8 g/dL (ref 6.0–8.5)

## 2019-01-31 ENCOUNTER — Ambulatory Visit: Payer: Self-pay | Admitting: Family Medicine

## 2019-03-02 ENCOUNTER — Encounter (HOSPITAL_COMMUNITY): Payer: Self-pay

## 2019-03-02 ENCOUNTER — Encounter (HOSPITAL_COMMUNITY): Payer: Self-pay | Admitting: *Deleted

## 2019-04-25 ENCOUNTER — Encounter (HOSPITAL_COMMUNITY): Payer: Self-pay

## 2019-04-25 ENCOUNTER — Emergency Department (HOSPITAL_COMMUNITY)
Admission: EM | Admit: 2019-04-25 | Discharge: 2019-04-25 | Disposition: A | Discharge status: Home / Self Care | Payer: Self-pay | Attending: Emergency Medicine | Admitting: Emergency Medicine

## 2019-04-25 ENCOUNTER — Other Ambulatory Visit: Payer: Self-pay

## 2019-04-25 DIAGNOSIS — L03115 Cellulitis of right lower limb: Secondary | ICD-10-CM | POA: Insufficient documentation

## 2019-04-25 DIAGNOSIS — Z91040 Latex allergy status: Secondary | ICD-10-CM | POA: Insufficient documentation

## 2019-04-25 DIAGNOSIS — S80861A Insect bite (nonvenomous), right lower leg, initial encounter: Secondary | ICD-10-CM

## 2019-04-25 DIAGNOSIS — F17210 Nicotine dependence, cigarettes, uncomplicated: Secondary | ICD-10-CM | POA: Insufficient documentation

## 2019-04-25 DIAGNOSIS — J45909 Unspecified asthma, uncomplicated: Secondary | ICD-10-CM | POA: Insufficient documentation

## 2019-04-25 MED ORDER — DOXYCYCLINE HYCLATE 100 MG PO CAPS: 100.0000 mg | ORAL_CAPSULE | Freq: Two times a day (BID) | ORAL | 0 refills | Status: DC

## 2019-04-25 MED ORDER — DOXYCYCLINE HYCLATE 100 MG PO TABS
100.0000 mg | ORAL_TABLET | Freq: Once | ORAL | Status: AC
  Administered 2019-04-25: 23:00:00 100 mg via ORAL
  Filled 2019-04-25: qty 1

## 2019-04-25 NOTE — Discharge Instructions (Signed)
You have evidence of infection in the skin on your leg.  However, there is no drainable pus seen with the ultrasound.  Please use warm compresses and take antibiotics as directed.
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
## 2019-04-25 NOTE — ED Provider Notes (Signed)
[facility] EMERGENCY DEPARTMENT Provider Note   CSN: [id number] Arrival date & time: 04/25/19  2013     History   Chief Complaint Chief Complaint  Patient presents with  . Insect Bite    HPI Antonio [name] is a 48 y.o. male.     Patient presents to the emergency department with a chief complaint of insect bite.  He states that he has had some redness on his right calf for the past week.  He believes he was bitten by something, though he did not see anything bite him.  He denies any fevers or chills.  He reports pain at the site.  Denies any treatments prior to arrival.  The history is provided by the patient. No language interpreter was used.    Past Medical History:  Diagnosis Date  . Alcoholic ([place])    in recovery since 02/2018  . Asthma   . Complication of anesthesia    hard to wake up  . Depression 09/17/2018   h/o suicidal ideation, previously homeless.  . Hepatitis    hep b and c  . Polysubstance overdose   . Ruptured spleen     Patient Active Problem List   Diagnosis Date Noted  . Acute blood loss anemia 12/29/2018  . Hyponatremia 12/28/2018  . Thigh hematoma, left, initial encounter 12/28/2018  . Alcoholic cirrhosis of liver ([place]) 12/28/2018  . Hematoma 12/28/2018  . Pancreatitis, acute 02/16/2016  . Chest pain 02/13/2016  . Alcoholic gastritis 123XX123  . 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 Syncope 02/12/2016  . Opioid dependence ([place]) 10/06/2012  . Unspecified episodic mood disorder 10/06/2012  . Alcohol dependence ([place]) 10/06/2012  . Polysubstance abuse ([place]) 02/24/2012  . Chronic back pain 02/24/2012  . Withdrawal from opioids ([place]) 02/24/2012  . 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 Thrombocytopenia ([place]) 02/24/2012  . Neutropenia- low relative neutrophil count 02/24/2012  . Dehydration with hypernatremia 02/24/2012  . Elevated CPK 02/24/2012  . Metabolic encephalopathy 2/2 alcohol and opiods/dehydration 02/24/2012  .  Depression 02/24/2012  . Hepatitis B 02/24/2012  . Hepatitis C 02/24/2012  . Alcohol abuse, daily use 06/02/2011    Past Surgical History:  Procedure Laterality Date  . NO PAST SURGERIES     spleen removed 5 yrs ago  . ORIF ANKLE FRACTURE Left 10/06/2013   Procedure: LEFT ANKLE FRACTURE OPEN TREATMENT BILMALLEOLAR ANKLE INCLUDES INTERNAL FIXATION, LEFT ANKLE FRACTURE OPEN TREATMENT DISTAL TIBIOFIBULAR INCLUDES INTERNAL FIXATION ;  Surgeon: Renette Butters, MD;  Location: [facility];  Service: Orthopedics;  Laterality: Left;  . ORIF ANKLE FRACTURE Right 09/21/2018   Procedure: Open reduction internal fixation right trimalleolar ankle fracture;  Surgeon: Wylene Simmer, MD;  Location: [facility];  Service: Orthopedics;  Laterality: Right;  32min  ok per OR desk to follow in Room 5  . spllenectomy          Home Medications    Prior to Admission medications   Medication Sig Start Date End Date Taking? Authorizing Provider  doxycycline (VIBRAMYCIN) 100 MG capsule Take 1 capsule (100 mg total) by mouth 2 (two) times daily. 04/25/19   Montine Circle, PA-C  folic acid (FOLVITE) 1 MG tablet Take 1 tablet (1 mg total) by mouth daily. 01/10/19   [place] Shi, MD  Multiple Vitamin (MULTIVITAMIN WITH MINERALS) TABS tablet Take 1 tablet by mouth daily. 01/10/19   [place] Shi, MD  thiamine 100 MG tablet Take 1 tablet (100 mg total) by mouth daily. 01/10/19  [place] Shi, MD    Family History Family History  Problem Relation Age of Onset  . Hepatitis Mother   . Alcohol abuse Brother     Social History Social History   Tobacco Use  . Smoking status: Current Every Day Smoker    Packs/day: 1.00    Years: 15.00    Pack years: 15.00    Types: Cigarettes  . Smokeless tobacco: Never Used  Substance Use Topics  . Alcohol use: Yes    Alcohol/week: 84.0 standard drinks    Types: 84 Cans of beer per week    Comment: in recovery since 02/2018  . Drug  use: Not Currently    Frequency: 28.0 times per week    Types: Hydrocodone, Heroin    Comment: heroin quit 5 years     Allergies   Fish allergy, Sulfa antibiotics, Bee venom, and Latex   Review of Systems Review of Systems  All other systems reviewed and are negative.    Physical Exam Updated Vital Signs BP 121/82   Pulse 74   Temp 98.6 F (37 C) (Oral)   Resp 16   Ht 5\' 5"  (1.651 m)   Wt 73.9 kg   SpO2 94%   BMI 27.11 kg/m   Physical Exam Vitals signs and nursing note reviewed.  Constitutional:      General: He is not in acute distress.    Appearance: He is well-developed. He is not ill-appearing.  HENT:     Head: Normocephalic and atraumatic.  Eyes:     Conjunctiva/sclera: Conjunctivae normal.  Neck:     Musculoskeletal: Neck supple.  Cardiovascular:     Rate and Rhythm: Normal rate.  Pulmonary:     Effort: Pulmonary effort is normal. No respiratory distress.  Abdominal:     General: There is no distension.  Musculoskeletal:     Comments: Moves all extremities  Skin:    General: Skin is warm and dry.     Comments: 3x3 cm area of erythema to the posterior right calf  Neurological:     Mental Status: He is alert and oriented to person, place, and time.  Psychiatric:        Mood and Affect: Mood normal.        Behavior: Behavior normal.      ED Treatments / Results  Labs (all labs ordered are listed, but only abnormal results are displayed) Labs Reviewed - No data to display  EKG None  Radiology No results found.  Procedures Procedures (including critical care time) EMERGENCY DEPARTMENT US SOFT TISSUE INTERPRETATION "Study: Limited Soft Tissue Ultrasound"  INDICATIONS: Soft tissue infection Multiple views of the body part were obtained in real-time with a multi-frequency linear probe  PERFORMED BY: Myself IMAGES ARCHIVED?: Yes SIDE:Right  BODY PART:Lower extremity INTERPRETATION:  Cellulitis present    Medications Ordered in ED  Medications  doxycycline (VIBRA-TABS) tablet 100 mg (has no administration in time range)     Initial Impression / Assessment and Plan / ED Course  I have reviewed the triage vital signs and the nursing notes.  Pertinent labs & imaging results that were available during my care of the patient were reviewed by me and considered in my medical decision making (see chart for details).        Patient with possible insect bite with cellulitis on RLE.  No abscess seen.  Cobblestoning pattern seen on [place].  Tx with doxy.  Final Clinical Impressions(s) / ED Diagnoses   Final diagnoses:  Cellulitis of right lower extremity  Insect bite of right lower leg, initial encounter    ED Discharge Orders         Ordered    doxycycline (VIBRAMYCIN) 100 MG capsule  2 times daily     04/25/19 2248           Montine Circle, PA-C 04/25/19 2253    Drenda Freeze, MD 04/26/19 [phone]

## 2019-04-28 ENCOUNTER — Ambulatory Visit: Payer: Self-pay | Admitting: Family Medicine

## 2019-06-26 ENCOUNTER — Emergency Department (HOSPITAL_COMMUNITY): Payer: Self-pay

## 2019-06-26 ENCOUNTER — Other Ambulatory Visit: Payer: Self-pay

## 2019-06-26 ENCOUNTER — Encounter (HOSPITAL_COMMUNITY): Payer: Self-pay | Admitting: *Deleted

## 2019-06-26 ENCOUNTER — Inpatient Hospital Stay (HOSPITAL_COMMUNITY)
Admission: EM | Admit: 2019-06-26 | Discharge: 2019-06-30 | DRG: 964 | Disposition: A | Discharge status: Rehabilitation Facility | Payer: Self-pay | Attending: General Surgery | Admitting: General Surgery

## 2019-06-26 DIAGNOSIS — Z79899 Other long term (current) drug therapy: Secondary | ICD-10-CM

## 2019-06-26 DIAGNOSIS — S271XXA Traumatic hemothorax, initial encounter: Secondary | ICD-10-CM | POA: Diagnosis present

## 2019-06-26 DIAGNOSIS — F1010 Alcohol abuse, uncomplicated: Secondary | ICD-10-CM | POA: Diagnosis present

## 2019-06-26 DIAGNOSIS — R402133 Coma scale, eyes open, to sound, at hospital admission: Secondary | ICD-10-CM | POA: Diagnosis present

## 2019-06-26 DIAGNOSIS — S066X9A Traumatic subarachnoid hemorrhage with loss of consciousness of unspecified duration, initial encounter: Secondary | ICD-10-CM | POA: Diagnosis present

## 2019-06-26 DIAGNOSIS — D72829 Elevated white blood cell count, unspecified: Secondary | ICD-10-CM | POA: Diagnosis present

## 2019-06-26 DIAGNOSIS — Z91040 Latex allergy status: Secondary | ICD-10-CM

## 2019-06-26 DIAGNOSIS — D696 Thrombocytopenia, unspecified: Secondary | ICD-10-CM | POA: Diagnosis present

## 2019-06-26 DIAGNOSIS — S2242XA Multiple fractures of ribs, left side, initial encounter for closed fracture: Secondary | ICD-10-CM | POA: Diagnosis present

## 2019-06-26 DIAGNOSIS — F1490 Cocaine use, unspecified, uncomplicated: Secondary | ICD-10-CM | POA: Diagnosis present

## 2019-06-26 DIAGNOSIS — J942 Hemothorax: Secondary | ICD-10-CM

## 2019-06-26 DIAGNOSIS — D62 Acute posthemorrhagic anemia: Secondary | ICD-10-CM | POA: Diagnosis present

## 2019-06-26 DIAGNOSIS — M542 Cervicalgia: Secondary | ICD-10-CM

## 2019-06-26 DIAGNOSIS — K7030 Alcoholic cirrhosis of liver without ascites: Secondary | ICD-10-CM | POA: Diagnosis present

## 2019-06-26 DIAGNOSIS — Y908 Blood alcohol level of 240 mg/100 ml or more: Secondary | ICD-10-CM | POA: Diagnosis present

## 2019-06-26 DIAGNOSIS — Z882 Allergy status to sulfonamides status: Secondary | ICD-10-CM

## 2019-06-26 DIAGNOSIS — Z91030 Bee allergy status: Secondary | ICD-10-CM

## 2019-06-26 DIAGNOSIS — B1920 Unspecified viral hepatitis C without hepatic coma: Secondary | ICD-10-CM | POA: Diagnosis present

## 2019-06-26 DIAGNOSIS — Z4682 Encounter for fitting and adjustment of non-vascular catheter: Secondary | ICD-10-CM

## 2019-06-26 DIAGNOSIS — F329 Major depressive disorder, single episode, unspecified: Secondary | ICD-10-CM | POA: Diagnosis present

## 2019-06-26 DIAGNOSIS — R402363 Coma scale, best motor response, obeys commands, at hospital admission: Secondary | ICD-10-CM | POA: Diagnosis present

## 2019-06-26 DIAGNOSIS — S01112A Laceration without foreign body of left eyelid and periocular area, initial encounter: Secondary | ICD-10-CM | POA: Diagnosis present

## 2019-06-26 DIAGNOSIS — Z9081 Acquired absence of spleen: Secondary | ICD-10-CM

## 2019-06-26 DIAGNOSIS — J45909 Unspecified asthma, uncomplicated: Secondary | ICD-10-CM | POA: Diagnosis present

## 2019-06-26 DIAGNOSIS — F10229 Alcohol dependence with intoxication, unspecified: Secondary | ICD-10-CM | POA: Diagnosis present

## 2019-06-26 DIAGNOSIS — M25562 Pain in left knee: Secondary | ICD-10-CM | POA: Diagnosis present

## 2019-06-26 DIAGNOSIS — Z91013 Allergy to seafood: Secondary | ICD-10-CM

## 2019-06-26 DIAGNOSIS — B1910 Unspecified viral hepatitis B without hepatic coma: Secondary | ICD-10-CM | POA: Diagnosis present

## 2019-06-26 DIAGNOSIS — Z23 Encounter for immunization: Secondary | ICD-10-CM

## 2019-06-26 DIAGNOSIS — S065X9A Traumatic subdural hemorrhage with loss of consciousness of unspecified duration, initial encounter: Principal | ICD-10-CM | POA: Diagnosis present

## 2019-06-26 DIAGNOSIS — F17210 Nicotine dependence, cigarettes, uncomplicated: Secondary | ICD-10-CM | POA: Diagnosis present

## 2019-06-26 DIAGNOSIS — R402253 Coma scale, best verbal response, oriented, at hospital admission: Secondary | ICD-10-CM | POA: Diagnosis present

## 2019-06-26 DIAGNOSIS — Z20822 Contact with and (suspected) exposure to covid-19: Secondary | ICD-10-CM | POA: Diagnosis present

## 2019-06-26 LAB — CBC
HCT: 36.4 % — ABNORMAL LOW (ref 39.0–52.0)
Hemoglobin: 11.9 g/dL — ABNORMAL LOW (ref 13.0–17.0)
MCH: 31.2 pg (ref 26.0–34.0)
MCHC: 32.7 g/dL (ref 30.0–36.0)
MCV: 95.3 fL (ref 80.0–100.0)
Platelets: 41 10*3/uL — ABNORMAL LOW (ref 150–400)
RBC: 3.82 MIL/uL — ABNORMAL LOW (ref 4.22–5.81)
RDW: 21.2 % — ABNORMAL HIGH (ref 11.5–15.5)
WBC: 6.2 10*3/uL (ref 4.0–10.5)
nRBC: 0 % (ref 0.0–0.2)

## 2019-06-26 LAB — I-STAT CHEM 8, ED
BUN: 12 mg/dL (ref 6–20)
Calcium, Ion: 0.97 mmol/L — ABNORMAL LOW (ref 1.15–1.40)
Chloride: 112 mmol/L — ABNORMAL HIGH (ref 98–111)
Creatinine, Ser: 0.9 mg/dL (ref 0.61–1.24)
Glucose, Bld: 117 mg/dL — ABNORMAL HIGH (ref 70–99)
HCT: 40 % (ref 39.0–52.0)
Hemoglobin: 13.6 g/dL (ref 13.0–17.0)
Potassium: 3.7 mmol/L (ref 3.5–5.1)
Sodium: 147 mmol/L — ABNORMAL HIGH (ref 135–145)
TCO2: 21 mmol/L — ABNORMAL LOW (ref 22–32)

## 2019-06-26 LAB — COMPREHENSIVE METABOLIC PANEL
ALT: 58 U/L — ABNORMAL HIGH (ref 0–44)
AST: 160 U/L — ABNORMAL HIGH (ref 15–41)
Albumin: 2.5 g/dL — ABNORMAL LOW (ref 3.5–5.0)
Alkaline Phosphatase: 139 U/L — ABNORMAL HIGH (ref 38–126)
Anion gap: 14 (ref 5–15)
BUN: 11 mg/dL (ref 6–20)
CO2: 17 mmol/L — ABNORMAL LOW (ref 22–32)
Calcium: 8 mg/dL — ABNORMAL LOW (ref 8.9–10.3)
Chloride: 113 mmol/L — ABNORMAL HIGH (ref 98–111)
Creatinine, Ser: 0.79 mg/dL (ref 0.61–1.24)
GFR calc Af Amer: >60 mL/min (ref >60)
GFR calc non Af Amer: >60 mL/min (ref >60)
Glucose, Bld: 124 mg/dL — ABNORMAL HIGH (ref 70–99)
Potassium: 3.8 mmol/L (ref 3.5–5.1)
Sodium: 144 mmol/L (ref 135–145)
Total Bilirubin: 4.3 mg/dL — ABNORMAL HIGH (ref 0.3–1.2)
Total Protein: 7.9 g/dL (ref 6.5–8.1)

## 2019-06-26 LAB — TYPE AND SCREEN
ABO/RH(D): O POS
Antibody Screen: NEGATIVE

## 2019-06-26 LAB — ETHANOL: Alcohol, Ethyl (B): 249 mg/dL — ABNORMAL HIGH (ref <10)

## 2019-06-26 LAB — I-STAT CREATININE, ED: Creatinine, Ser: 0.8 mg/dL (ref 0.61–1.24)

## 2019-06-26 LAB — PROTIME-INR
INR: 1.4 — ABNORMAL HIGH (ref 0.8–1.2)
Prothrombin Time: 17.2 seconds — ABNORMAL HIGH (ref 11.4–15.2)

## 2019-06-26 LAB — RESPIRATORY PANEL BY RT PCR (FLU A&B, COVID)
Influenza A by PCR: NEGATIVE
Influenza B by PCR: NEGATIVE
SARS Coronavirus 2 by RT PCR: NEGATIVE

## 2019-06-26 LAB — CK: Total CK: 369 U/L (ref 49–397)

## 2019-06-26 MED ORDER — SODIUM CHLORIDE 0.9 % IV SOLN: INTRAVENOUS | Status: DC

## 2019-06-26 MED ORDER — OXYCODONE HCL 5 MG PO TABS
5.0000 mg | ORAL_TABLET | ORAL | Status: DC | PRN
  Administered 2019-06-27: 5 mg via ORAL
  Filled 2019-06-26: qty 1

## 2019-06-26 MED ORDER — FOLIC ACID 1 MG PO TABS
1.0000 mg | ORAL_TABLET | Freq: Every day | ORAL | Status: DC
  Administered 2019-06-27 – 2019-06-30 (×4): 1 mg via ORAL
  Filled 2019-06-26 (×4): qty 1

## 2019-06-26 MED ORDER — HYDROMORPHONE HCL 1 MG/ML IJ SOLN
0.5000 mg | INTRAMUSCULAR | Status: DC | PRN
  Administered 2019-06-26 – 2019-06-30 (×11): 0.5 mg via INTRAVENOUS
  Filled 2019-06-26 (×12): qty 1

## 2019-06-26 MED ORDER — ONDANSETRON 4 MG PO TBDP: 4.0000 mg | ORAL_TABLET | Freq: Four times a day (QID) | ORAL | Status: DC | PRN

## 2019-06-26 MED ORDER — IOHEXOL 300 MG/ML  SOLN
100.0000 mL | Freq: Once | INTRAMUSCULAR | Status: AC | PRN
  Administered 2019-06-26: 100 mL via INTRAVENOUS

## 2019-06-26 MED ORDER — BISACODYL 10 MG RE SUPP
10.0000 mg | Freq: Every day | RECTAL | Status: DC | PRN
  Filled 2019-06-26: qty 1

## 2019-06-26 MED ORDER — IBUPROFEN 800 MG PO TABS
800.0000 mg | ORAL_TABLET | Freq: Three times a day (TID) | ORAL | Status: DC
  Administered 2019-06-27: 800 mg via ORAL
  Filled 2019-06-26 (×2): qty 1

## 2019-06-26 MED ORDER — HYDROMORPHONE HCL 1 MG/ML IJ SOLN
0.5000 mg | Freq: Once | INTRAMUSCULAR | Status: AC
  Administered 2019-06-26: 21:00:00 0.5 mg via INTRAVENOUS
  Filled 2019-06-26: qty 1

## 2019-06-26 MED ORDER — FENTANYL CITRATE (PF) 100 MCG/2ML IJ SOLN
25.0000 ug | Freq: Once | INTRAMUSCULAR | Status: AC
  Administered 2019-06-26: 21:00:00 25 ug via INTRAVENOUS
  Filled 2019-06-26: qty 2

## 2019-06-26 MED ORDER — DOCUSATE SODIUM 100 MG PO CAPS
100.0000 mg | ORAL_CAPSULE | Freq: Two times a day (BID) | ORAL | Status: DC
  Administered 2019-06-27 – 2019-06-30 (×6): 100 mg via ORAL
  Filled 2019-06-26 (×7): qty 1

## 2019-06-26 MED ORDER — HYDRALAZINE HCL 20 MG/ML IJ SOLN: 10.0000 mg | INTRAMUSCULAR | Status: DC | PRN

## 2019-06-26 MED ORDER — THIAMINE HCL 100 MG PO TABS
100.0000 mg | ORAL_TABLET | Freq: Every day | ORAL | Status: DC
  Administered 2019-06-29 – 2019-06-30 (×2): 100 mg via ORAL
  Filled 2019-06-26 (×3): qty 1

## 2019-06-26 MED ORDER — PANTOPRAZOLE SODIUM 40 MG PO TBEC
40.0000 mg | DELAYED_RELEASE_TABLET | Freq: Every day | ORAL | Status: DC
  Filled 2019-06-26: qty 1

## 2019-06-26 MED ORDER — THIAMINE HCL 100 MG/ML IJ SOLN
100.0000 mg | Freq: Every day | INTRAMUSCULAR | Status: DC
  Administered 2019-06-27 – 2019-06-28 (×2): 100 mg via INTRAVENOUS
  Filled 2019-06-26 (×2): qty 2

## 2019-06-26 MED ORDER — LORAZEPAM 2 MG/ML IJ SOLN
1.0000 mg | INTRAMUSCULAR | Status: DC | PRN
  Administered 2019-06-27: 1 mg via INTRAVENOUS
  Administered 2019-06-27 (×2): 2 mg via INTRAVENOUS
  Filled 2019-06-26 (×3): qty 1

## 2019-06-26 MED ORDER — ONDANSETRON HCL 4 MG/2ML IJ SOLN
4.0000 mg | Freq: Once | INTRAMUSCULAR | Status: AC
  Administered 2019-06-26: 4 mg via INTRAVENOUS
  Filled 2019-06-26: qty 2

## 2019-06-26 MED ORDER — LORAZEPAM 1 MG PO TABS: 1.0000 mg | ORAL_TABLET | ORAL | Status: DC | PRN

## 2019-06-26 MED ORDER — METOPROLOL TARTRATE 5 MG/5ML IV SOLN: 5.0000 mg | Freq: Four times a day (QID) | INTRAVENOUS | Status: DC | PRN

## 2019-06-26 MED ORDER — SODIUM CHLORIDE 0.9 % IV BOLUS
1000.0000 mL | Freq: Once | INTRAVENOUS | Status: AC
  Administered 2019-06-26: 21:00:00 1000 mL via INTRAVENOUS

## 2019-06-26 MED ORDER — PANTOPRAZOLE SODIUM 40 MG IV SOLR
40.0000 mg | Freq: Every day | INTRAVENOUS | Status: DC
  Administered 2019-06-27: 11:00:00 40 mg via INTRAVENOUS
  Filled 2019-06-26: qty 40

## 2019-06-26 MED ORDER — METHOCARBAMOL 500 MG PO TABS: 500.0000 mg | ORAL_TABLET | Freq: Four times a day (QID) | ORAL | Status: DC | PRN

## 2019-06-26 MED ORDER — ONDANSETRON HCL 4 MG/2ML IJ SOLN: 4.0000 mg | Freq: Four times a day (QID) | INTRAMUSCULAR | Status: DC | PRN

## 2019-06-26 MED ORDER — ADULT MULTIVITAMIN W/MINERALS CH
1.0000 | ORAL_TABLET | Freq: Every day | ORAL | Status: DC
  Administered 2019-06-27 – 2019-06-30 (×4): 1 via ORAL
  Filled 2019-06-26 (×4): qty 1

## 2019-06-26 MED ORDER — GABAPENTIN 300 MG PO CAPS
300.0000 mg | ORAL_CAPSULE | Freq: Three times a day (TID) | ORAL | Status: DC
  Administered 2019-06-27 – 2019-06-30 (×12): 300 mg via ORAL
  Filled 2019-06-26 (×12): qty 1

## 2019-06-26 NOTE — ED Notes (Signed)
[place] collar placed on patient and lips moistened with wet washcloth.
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
## 2019-06-26 NOTE — ED Notes (Addendum)
[place] collar placed-- pt able to move all extremities after collar change-- pt asking for a drink of water or a popsicle

## 2019-06-26 NOTE — Procedures (Signed)
Chest tube insertion Note  Leonard Fagnani  [id number]  [id number]  06/26/2019   Surgeon: Vikki Ports A ConnorMD  Procedure performed: Left chest tube placement  Preop diagnosis: Left hemothorax Post-op diagnosis/intraop findings: Same  Specimens: None Retained items: 9fr pigtail left 5th intercostal space EBL: minimal from procedure. 1200cc blood drained from the chest.  Complications: none  Description of procedure: After verbal informed consent the left chest was prepped and draped in usual sterile fashion.  The skin overlying the fifth intercostal space was anesthetized with local and a stab incision was made.  The left pleural cavity was accessed with a needle and standard Seldinger technique was used to place a 14 French pigtail catheter in the left pleural cavity.  This was connected to Pleur-evac suction and approximately 1200 mL of old blood was evacuated from the chest.  Patient tolerated the procedure well.
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
## 2019-06-26 NOTE — ED Notes (Signed)
O2 sats 86%.  Pt placed on 2 liters  and taken to room by Gabriel Cirri, RN.
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
## 2019-06-27 ENCOUNTER — Inpatient Hospital Stay (HOSPITAL_COMMUNITY): Payer: Self-pay

## 2019-06-27 LAB — CBC
HCT: 27.3 % — ABNORMAL LOW (ref 39.0–52.0)
Hemoglobin: 9.1 g/dL — ABNORMAL LOW (ref 13.0–17.0)
MCH: 30.8 pg (ref 26.0–34.0)
MCHC: 33.3 g/dL (ref 30.0–36.0)
MCV: 92.5 fL (ref 80.0–100.0)
Platelets: 38 10*3/uL — ABNORMAL LOW (ref 150–400)
RBC: 2.95 MIL/uL — ABNORMAL LOW (ref 4.22–5.81)
RDW: 20.9 % — ABNORMAL HIGH (ref 11.5–15.5)
WBC: 12.3 10*3/uL — ABNORMAL HIGH (ref 4.0–10.5)
nRBC: 0 % (ref 0.0–0.2)

## 2019-06-27 LAB — FOLATE: Folate: 15.7 ng/mL (ref >5.9)

## 2019-06-27 LAB — HEPATIC FUNCTION PANEL
ALT: 40 U/L (ref 0–44)
AST: 135 U/L — ABNORMAL HIGH (ref 15–41)
Albumin: 2 g/dL — ABNORMAL LOW (ref 3.5–5.0)
Alkaline Phosphatase: 93 U/L (ref 38–126)
Bilirubin, Direct: 2 mg/dL — ABNORMAL HIGH (ref 0.0–0.2)
Indirect Bilirubin: 2.3 mg/dL — ABNORMAL HIGH (ref 0.3–0.9)
Total Bilirubin: 4.3 mg/dL — ABNORMAL HIGH (ref 0.3–1.2)
Total Protein: 6.1 g/dL — ABNORMAL LOW (ref 6.5–8.1)

## 2019-06-27 LAB — PHOSPHORUS: Phosphorus: 2.3 mg/dL — ABNORMAL LOW (ref 2.5–4.6)

## 2019-06-27 LAB — BASIC METABOLIC PANEL
Anion gap: 10 (ref 5–15)
BUN: 12 mg/dL (ref 6–20)
CO2: 20 mmol/L — ABNORMAL LOW (ref 22–32)
Calcium: 7.5 mg/dL — ABNORMAL LOW (ref 8.9–10.3)
Chloride: 110 mmol/L (ref 98–111)
Creatinine, Ser: 0.62 mg/dL (ref 0.61–1.24)
GFR calc Af Amer: >60 mL/min (ref >60)
GFR calc non Af Amer: >60 mL/min (ref >60)
Glucose, Bld: 151 mg/dL — ABNORMAL HIGH (ref 70–99)
Potassium: 4.2 mmol/L (ref 3.5–5.1)
Sodium: 140 mmol/L (ref 135–145)

## 2019-06-27 LAB — MAGNESIUM: Magnesium: 1.8 mg/dL (ref 1.7–2.4)

## 2019-06-27 LAB — AMMONIA: Ammonia: 83 umol/L — ABNORMAL HIGH (ref 9–35)

## 2019-06-27 LAB — MRSA PCR SCREENING: MRSA by PCR: NEGATIVE

## 2019-06-27 LAB — VITAMIN B12: Vitamin B-12: 550 pg/mL (ref 180–914)

## 2019-06-27 MED ORDER — LORAZEPAM 2 MG/ML IJ SOLN: 0.5000 mg | Freq: Four times a day (QID) | INTRAMUSCULAR | Status: DC | PRN

## 2019-06-27 MED ORDER — OXYCODONE HCL 5 MG PO TABS
5.0000 mg | ORAL_TABLET | ORAL | Status: DC | PRN
  Administered 2019-06-27 – 2019-06-28 (×5): 5 mg via ORAL
  Filled 2019-06-27 (×5): qty 1

## 2019-06-27 MED ORDER — SODIUM PHOSPHATES 45 MMOLE/15ML IV SOLN
10.0000 mmol | Freq: Once | INTRAVENOUS | Status: AC
  Administered 2019-06-27: 11:00:00 10 mmol via INTRAVENOUS
  Filled 2019-06-27: qty 3.33

## 2019-06-27 MED ORDER — ORAL CARE MOUTH RINSE
15.0000 mL | Freq: Two times a day (BID) | OROMUCOSAL | Status: DC
  Administered 2019-06-27 – 2019-06-30 (×6): 15 mL via OROMUCOSAL

## 2019-06-27 MED ORDER — DEXMEDETOMIDINE HCL IN NACL 400 MCG/100ML IV SOLN
0.2000 ug/kg/h | INTRAVENOUS | Status: DC
  Administered 2019-06-27: 17:00:00 0.2 ug/kg/h via INTRAVENOUS
  Administered 2019-06-28: 15:00:00 0.4 ug/kg/h via INTRAVENOUS
  Administered 2019-06-28: 06:00:00 0.5 ug/kg/h via INTRAVENOUS
  Filled 2019-06-27 (×3): qty 100

## 2019-06-27 MED ORDER — METHOCARBAMOL 1000 MG/10ML IJ SOLN
1000.0000 mg | Freq: Three times a day (TID) | INTRAVENOUS | Status: DC
  Administered 2019-06-27 – 2019-06-29 (×6): 1000 mg via INTRAVENOUS
  Filled 2019-06-27 (×11): qty 10

## 2019-06-27 MED ORDER — SPIRITUS FRUMENTI
1.0000 | Freq: Three times a day (TID) | ORAL | Status: DC
  Administered 2019-06-27 – 2019-06-29 (×6): 1 via ORAL
  Filled 2019-06-27 (×9): qty 1

## 2019-06-27 MED ORDER — THIAMINE HCL 100 MG/ML IJ SOLN: Freq: Once | INTRAVENOUS | Status: DC

## 2019-06-27 MED ORDER — LORAZEPAM 2 MG/ML IJ SOLN
0.5000 mg | Freq: Four times a day (QID) | INTRAMUSCULAR | Status: DC | PRN
  Administered 2019-06-27: 0.5 mg via INTRAVENOUS
  Filled 2019-06-27: qty 1

## 2019-06-27 MED ORDER — LACTULOSE 10 GM/15ML PO SOLN
10.0000 g | Freq: Two times a day (BID) | ORAL | Status: DC
  Administered 2019-06-27 – 2019-06-30 (×6): 10 g via ORAL
  Filled 2019-06-27 (×7): qty 15

## 2019-06-27 MED ORDER — MAGNESIUM SULFATE 2 GM/50ML IV SOLN
2.0000 g | Freq: Once | INTRAVENOUS | Status: AC
  Administered 2019-06-27: 11:00:00 2 g via INTRAVENOUS
  Filled 2019-06-27: qty 50

## 2019-06-27 MED ORDER — CHLORHEXIDINE GLUCONATE CLOTH 2 % EX PADS
6.0000 | MEDICATED_PAD | Freq: Every day | CUTANEOUS | Status: DC
  Administered 2019-06-27 – 2019-06-30 (×2): 6 via TOPICAL

## 2019-06-27 MED ORDER — PNEUMOCOCCAL VAC POLYVALENT 25 MCG/0.5ML IJ INJ
0.5000 mL | INJECTION | INTRAMUSCULAR | Status: AC
  Administered 2019-06-28: 10:00:00 0.5 mL via INTRAMUSCULAR
  Filled 2019-06-27: qty 0.5

## 2019-06-27 NOTE — Progress Notes (Signed)
Wallet with several business cards only Cell phone with [place]

## 2019-06-27 NOTE — Progress Notes (Signed)
Patient more awake and is jittery in his speech, but is oriented to where he is and why he is here.  Complains of pain all over and some even in his neck.  CT scan is negative, but with palpation to his cervical midline he complains of pain.  Will get flex-ex films.  Place on ativan, restart CIWA when he gets to the ICU, and start him on beer.  Henreitta Cea 1:21 PM 06/27/2019

## 2019-06-27 NOTE — Progress Notes (Signed)
NEUROSURGERY PROGRESS NOTE  Follow up scan reviewed, Stable small left occipital SDH with no real change. Change in neuro status per notes sounds like a result of ativan that was given to the patient.   Temp:  [97.5 F (36.4 C)] 97.5 F (36.4 C) (01/03 1828) Pulse Rate:  [65-121] 73 (01/04 0815) Resp:  [16-31] 17 (01/04 0815) BP: (107-155)/(58-110) 129/69 (01/04 0815) SpO2:  [91 %-100 %] 99 % (01/04 0815)    Antonio Chiquito, NP 06/27/2019 9:09 AM

## 2019-06-27 NOTE — ED Notes (Signed)
Breakfast ordered 
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
## 2019-06-28 ENCOUNTER — Inpatient Hospital Stay (HOSPITAL_COMMUNITY): Payer: Self-pay

## 2019-06-28 ENCOUNTER — Ambulatory Visit: Payer: Self-pay | Admitting: Family Medicine

## 2019-06-28 DIAGNOSIS — S069X9S Unspecified intracranial injury with loss of consciousness of unspecified duration, sequela: Secondary | ICD-10-CM

## 2019-06-28 DIAGNOSIS — F0281 Dementia in other diseases classified elsewhere with behavioral disturbance: Secondary | ICD-10-CM

## 2019-06-28 LAB — COMPREHENSIVE METABOLIC PANEL
ALT: 44 U/L (ref 0–44)
AST: 117 U/L — ABNORMAL HIGH (ref 15–41)
Albumin: 2.2 g/dL — ABNORMAL LOW (ref 3.5–5.0)
Alkaline Phosphatase: 106 U/L (ref 38–126)
Anion gap: 5 (ref 5–15)
BUN: 9 mg/dL (ref 6–20)
CO2: 24 mmol/L (ref 22–32)
Calcium: 7.6 mg/dL — ABNORMAL LOW (ref 8.9–10.3)
Chloride: 108 mmol/L (ref 98–111)
Creatinine, Ser: 0.62 mg/dL (ref 0.61–1.24)
GFR calc Af Amer: >60 mL/min (ref >60)
GFR calc non Af Amer: >60 mL/min (ref >60)
Glucose, Bld: 80 mg/dL (ref 70–99)
Potassium: 4 mmol/L (ref 3.5–5.1)
Sodium: 137 mmol/L (ref 135–145)
Total Bilirubin: 4.8 mg/dL — ABNORMAL HIGH (ref 0.3–1.2)
Total Protein: 6.7 g/dL (ref 6.5–8.1)

## 2019-06-28 LAB — CBC
HCT: 29.6 % — ABNORMAL LOW (ref 39.0–52.0)
Hemoglobin: 9.9 g/dL — ABNORMAL LOW (ref 13.0–17.0)
MCH: 31.3 pg (ref 26.0–34.0)
MCHC: 33.4 g/dL (ref 30.0–36.0)
MCV: 93.7 fL (ref 80.0–100.0)
Platelets: 46 10*3/uL — ABNORMAL LOW (ref 150–400)
RBC: 3.16 MIL/uL — ABNORMAL LOW (ref 4.22–5.81)
RDW: 20.2 % — ABNORMAL HIGH (ref 11.5–15.5)
WBC: 12.2 10*3/uL — ABNORMAL HIGH (ref 4.0–10.5)
nRBC: 0.2 % (ref 0.0–0.2)

## 2019-06-28 LAB — PROTIME-INR
INR: 1.4 — ABNORMAL HIGH (ref 0.8–1.2)
Prothrombin Time: 17.3 seconds — ABNORMAL HIGH (ref 11.4–15.2)

## 2019-06-28 LAB — MAGNESIUM: Magnesium: 2.3 mg/dL (ref 1.7–2.4)

## 2019-06-28 LAB — PHOSPHORUS: Phosphorus: 2.1 mg/dL — ABNORMAL LOW (ref 2.5–4.6)
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
## 2019-06-28 NOTE — Progress Notes (Signed)
C Collar removed per order.
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
## 2019-06-28 NOTE — Progress Notes (Signed)
Rehab Admissions Coordinator Note:  Patient was screened by Michel Santee for appropriateness for an Inpatient Acute Rehab Consult.  At this time, we are recommending Inpatient Rehab consult. I will place an order.   Michel Santee 06/28/2019, 4:03 PM  I can be reached at [phone].

## 2019-06-28 NOTE — Progress Notes (Signed)
Updated Colonel Bald on pt's status. She states she is pt's POA. Her number is [phone].
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
## 2019-06-29 ENCOUNTER — Encounter (HOSPITAL_COMMUNITY): Payer: Self-pay

## 2019-06-29 ENCOUNTER — Inpatient Hospital Stay (HOSPITAL_COMMUNITY): Payer: Self-pay

## 2019-06-29 LAB — COMPREHENSIVE METABOLIC PANEL
ALT: 35 U/L (ref 0–44)
AST: 94 U/L — ABNORMAL HIGH (ref 15–41)
Albumin: 2.1 g/dL — ABNORMAL LOW (ref 3.5–5.0)
Alkaline Phosphatase: 109 U/L (ref 38–126)
Anion gap: 9 (ref 5–15)
BUN: 11 mg/dL (ref 6–20)
CO2: 18 mmol/L — ABNORMAL LOW (ref 22–32)
Calcium: 7.7 mg/dL — ABNORMAL LOW (ref 8.9–10.3)
Chloride: 105 mmol/L (ref 98–111)
Creatinine, Ser: 0.62 mg/dL (ref 0.61–1.24)
GFR calc Af Amer: >60 mL/min (ref >60)
GFR calc non Af Amer: >60 mL/min (ref >60)
Glucose, Bld: 115 mg/dL — ABNORMAL HIGH (ref 70–99)
Potassium: 4 mmol/L (ref 3.5–5.1)
Sodium: 132 mmol/L — ABNORMAL LOW (ref 135–145)
Total Bilirubin: 3.2 mg/dL — ABNORMAL HIGH (ref 0.3–1.2)
Total Protein: 6.1 g/dL — ABNORMAL LOW (ref 6.5–8.1)

## 2019-06-29 LAB — CBC
HCT: 27.5 % — ABNORMAL LOW (ref 39.0–52.0)
Hemoglobin: 9.2 g/dL — ABNORMAL LOW (ref 13.0–17.0)
MCH: 31 pg (ref 26.0–34.0)
MCHC: 33.5 g/dL (ref 30.0–36.0)
MCV: 92.6 fL (ref 80.0–100.0)
Platelets: 62 10*3/uL — ABNORMAL LOW (ref 150–400)
RBC: 2.97 MIL/uL — ABNORMAL LOW (ref 4.22–5.81)
RDW: 20 % — ABNORMAL HIGH (ref 11.5–15.5)
WBC: 12.3 10*3/uL — ABNORMAL HIGH (ref 4.0–10.5)
nRBC: 0.5 % — ABNORMAL HIGH (ref 0.0–0.2)

## 2019-06-29 LAB — AMMONIA: Ammonia: 66 umol/L — ABNORMAL HIGH (ref 9–35)

## 2019-06-29 LAB — MAGNESIUM: Magnesium: 1.8 mg/dL (ref 1.7–2.4)

## 2019-06-29 LAB — PHOSPHORUS: Phosphorus: 2.2 mg/dL — ABNORMAL LOW (ref 2.5–4.6)

## 2019-06-29 MED ORDER — METHOCARBAMOL 500 MG PO TABS
1000.0000 mg | ORAL_TABLET | Freq: Three times a day (TID) | ORAL | Status: DC
  Administered 2019-06-29 – 2019-06-30 (×5): 1000 mg via ORAL
  Filled 2019-06-29 (×5): qty 2

## 2019-06-29 MED ORDER — SPIRITUS FRUMENTI
1.0000 | Freq: Three times a day (TID) | ORAL | Status: DC
  Administered 2019-06-29 – 2019-06-30 (×4): 1 via ORAL
  Filled 2019-06-29 (×7): qty 1

## 2019-06-29 MED ORDER — ENOXAPARIN SODIUM 30 MG/0.3ML ~~LOC~~ SOLN
30.0000 mg | Freq: Two times a day (BID) | SUBCUTANEOUS | Status: DC
  Administered 2019-06-29 (×2): 30 mg via SUBCUTANEOUS
  Filled 2019-06-29 (×2): qty 0.3

## 2019-06-29 MED ORDER — ACETAMINOPHEN 325 MG PO TABS
650.0000 mg | ORAL_TABLET | Freq: Four times a day (QID) | ORAL | Status: DC | PRN
  Administered 2019-06-29: 650 mg via ORAL
  Filled 2019-06-29: qty 2

## 2019-06-29 MED ORDER — MAGNESIUM SULFATE 2 GM/50ML IV SOLN
2.0000 g | Freq: Once | INTRAVENOUS | Status: AC
  Administered 2019-06-29: 11:00:00 2 g via INTRAVENOUS
  Filled 2019-06-29: qty 50

## 2019-06-29 MED ORDER — SODIUM PHOSPHATES 45 MMOLE/15ML IV SOLN
20.0000 mmol | Freq: Once | INTRAVENOUS | Status: AC
  Administered 2019-06-29: 20 mmol via INTRAVENOUS
  Filled 2019-06-29: qty 6.67

## 2019-06-29 MED ORDER — OXYCODONE HCL 5 MG/5ML PO SOLN
5.0000 mg | ORAL | Status: DC | PRN
  Administered 2019-06-29: 19:00:00 10 mg
  Filled 2019-06-29: qty 10
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
## 2019-06-29 NOTE — Plan of Care (Signed)
  Problem: Education: Goal: Knowledge of General Education information will improve Description: Including pain rating scale, medication(s)/side effects and non-pharmacologic comfort measures Outcome: Progressing   Problem: Health Behavior/Discharge Planning: Goal: Ability to manage health-related needs will improve Outcome: Progressing   Problem: Clinical Measurements: Goal: Will remain free from infection Outcome: Progressing Goal: Cardiovascular complication will be avoided Outcome: Progressing   Problem: Activity: Goal: Risk for activity intolerance will decrease Outcome: Progressing   Problem: Coping: Goal: Level of anxiety will decrease Outcome: Progressing   Problem: Elimination: Goal: Will not experience complications related to urinary retention Outcome: Progressing   Problem: Pain Managment: Goal: General experience of comfort will improve Outcome: Progressing   Problem: Safety: Goal: Ability to remain free from injury will improve Outcome: Progressing   Problem: Skin Integrity: Goal: Risk for impaired skin integrity will decrease Outcome: Progressing
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
## 2019-06-30 ENCOUNTER — Encounter (HOSPITAL_COMMUNITY): Payer: Self-pay

## 2019-06-30 ENCOUNTER — Encounter (HOSPITAL_COMMUNITY): Payer: Self-pay | Admitting: Physical Medicine and Rehabilitation

## 2019-06-30 ENCOUNTER — Other Ambulatory Visit: Payer: Self-pay

## 2019-06-30 ENCOUNTER — Inpatient Hospital Stay (HOSPITAL_COMMUNITY)
Admission: RE | Admit: 2019-06-30 | Discharge: 2019-07-08 | DRG: 945 | Disposition: A | Discharge status: Home Health | Payer: Self-pay | Source: Intra-hospital | Attending: Physical Medicine and Rehabilitation | Admitting: Physical Medicine and Rehabilitation

## 2019-06-30 DIAGNOSIS — Z811 Family history of alcohol abuse and dependence: Secondary | ICD-10-CM

## 2019-06-30 DIAGNOSIS — D696 Thrombocytopenia, unspecified: Secondary | ICD-10-CM | POA: Diagnosis present

## 2019-06-30 DIAGNOSIS — S065XAA Traumatic subdural hemorrhage with loss of consciousness status unknown, initial encounter: Secondary | ICD-10-CM | POA: Diagnosis present

## 2019-06-30 DIAGNOSIS — D62 Acute posthemorrhagic anemia: Secondary | ICD-10-CM | POA: Diagnosis present

## 2019-06-30 DIAGNOSIS — S066X9D Traumatic subarachnoid hemorrhage with loss of consciousness of unspecified duration, subsequent encounter: Secondary | ICD-10-CM

## 2019-06-30 DIAGNOSIS — S065X9A Traumatic subdural hemorrhage with loss of consciousness of unspecified duration, initial encounter: Secondary | ICD-10-CM

## 2019-06-30 DIAGNOSIS — F1010 Alcohol abuse, uncomplicated: Secondary | ICD-10-CM

## 2019-06-30 DIAGNOSIS — S065X9D Traumatic subdural hemorrhage with loss of consciousness of unspecified duration, subsequent encounter: Secondary | ICD-10-CM

## 2019-06-30 DIAGNOSIS — F17210 Nicotine dependence, cigarettes, uncomplicated: Secondary | ICD-10-CM | POA: Diagnosis present

## 2019-06-30 DIAGNOSIS — Z9081 Acquired absence of spleen: Secondary | ICD-10-CM

## 2019-06-30 DIAGNOSIS — Z91030 Bee allergy status: Secondary | ICD-10-CM

## 2019-06-30 DIAGNOSIS — K5900 Constipation, unspecified: Secondary | ICD-10-CM | POA: Diagnosis present

## 2019-06-30 DIAGNOSIS — S0083XD Contusion of other part of head, subsequent encounter: Secondary | ICD-10-CM

## 2019-06-30 DIAGNOSIS — K709 Alcoholic liver disease, unspecified: Secondary | ICD-10-CM | POA: Diagnosis present

## 2019-06-30 DIAGNOSIS — F1020 Alcohol dependence, uncomplicated: Secondary | ICD-10-CM | POA: Diagnosis present

## 2019-06-30 DIAGNOSIS — J942 Hemothorax: Secondary | ICD-10-CM

## 2019-06-30 DIAGNOSIS — S069X9A Unspecified intracranial injury with loss of consciousness of unspecified duration, initial encounter: Secondary | ICD-10-CM | POA: Diagnosis present

## 2019-06-30 DIAGNOSIS — B1910 Unspecified viral hepatitis B without hepatic coma: Secondary | ICD-10-CM | POA: Diagnosis present

## 2019-06-30 DIAGNOSIS — Z590 Homelessness: Secondary | ICD-10-CM

## 2019-06-30 DIAGNOSIS — S069XAA Unspecified intracranial injury with loss of consciousness status unknown, initial encounter: Secondary | ICD-10-CM | POA: Diagnosis present

## 2019-06-30 DIAGNOSIS — B1920 Unspecified viral hepatitis C without hepatic coma: Secondary | ICD-10-CM | POA: Diagnosis present

## 2019-06-30 DIAGNOSIS — R160 Hepatomegaly, not elsewhere classified: Secondary | ICD-10-CM | POA: Diagnosis present

## 2019-06-30 DIAGNOSIS — D72829 Elevated white blood cell count, unspecified: Secondary | ICD-10-CM | POA: Diagnosis present

## 2019-06-30 DIAGNOSIS — Z91040 Latex allergy status: Secondary | ICD-10-CM

## 2019-06-30 DIAGNOSIS — Z882 Allergy status to sulfonamides status: Secondary | ICD-10-CM

## 2019-06-30 DIAGNOSIS — K7030 Alcoholic cirrhosis of liver without ascites: Secondary | ICD-10-CM | POA: Diagnosis present

## 2019-06-30 DIAGNOSIS — S2242XD Multiple fractures of ribs, left side, subsequent encounter for fracture with routine healing: Secondary | ICD-10-CM

## 2019-06-30 DIAGNOSIS — Z91013 Allergy to seafood: Secondary | ICD-10-CM

## 2019-06-30 DIAGNOSIS — S271XXD Traumatic hemothorax, subsequent encounter: Principal | ICD-10-CM

## 2019-06-30 LAB — CBC
HCT: 28.2 % — ABNORMAL LOW (ref 39.0–52.0)
Hemoglobin: 9.6 g/dL — ABNORMAL LOW (ref 13.0–17.0)
MCH: 31.5 pg (ref 26.0–34.0)
MCHC: 34 g/dL (ref 30.0–36.0)
MCV: 92.5 fL (ref 80.0–100.0)
Platelets: 77 10*3/uL — ABNORMAL LOW (ref 150–400)
RBC: 3.05 MIL/uL — ABNORMAL LOW (ref 4.22–5.81)
RDW: 20.5 % — ABNORMAL HIGH (ref 11.5–15.5)
WBC: 11.8 10*3/uL — ABNORMAL HIGH (ref 4.0–10.5)
nRBC: 0.4 % — ABNORMAL HIGH (ref 0.0–0.2)

## 2019-06-30 LAB — COMPREHENSIVE METABOLIC PANEL
ALT: 34 U/L (ref 0–44)
AST: 87 U/L — ABNORMAL HIGH (ref 15–41)
Albumin: 1.9 g/dL — ABNORMAL LOW (ref 3.5–5.0)
Alkaline Phosphatase: 148 U/L — ABNORMAL HIGH (ref 38–126)
Anion gap: 10 (ref 5–15)
BUN: 13 mg/dL (ref 6–20)
CO2: 19 mmol/L — ABNORMAL LOW (ref 22–32)
Calcium: 7.6 mg/dL — ABNORMAL LOW (ref 8.9–10.3)
Chloride: 107 mmol/L (ref 98–111)
Creatinine, Ser: 0.61 mg/dL (ref 0.61–1.24)
GFR calc Af Amer: >60 mL/min (ref >60)
GFR calc non Af Amer: >60 mL/min (ref >60)
Glucose, Bld: 88 mg/dL (ref 70–99)
Potassium: 3.7 mmol/L (ref 3.5–5.1)
Sodium: 136 mmol/L (ref 135–145)
Total Bilirubin: 3.3 mg/dL — ABNORMAL HIGH (ref 0.3–1.2)
Total Protein: 6.1 g/dL — ABNORMAL LOW (ref 6.5–8.1)

## 2019-06-30 LAB — MAGNESIUM: Magnesium: 2.1 mg/dL (ref 1.7–2.4)

## 2019-06-30 LAB — PHOSPHORUS: Phosphorus: 3.5 mg/dL (ref 2.5–4.6)

## 2019-06-30 MED ORDER — LORAZEPAM 2 MG/ML IJ SOLN: 1.0000 mg | INTRAMUSCULAR | Status: DC | PRN

## 2019-06-30 MED ORDER — SPIRITUS FRUMENTI
1.0000 | Freq: Three times a day (TID) | ORAL | Status: DC
  Administered 2019-07-01 – 2019-07-08 (×22): 1 via ORAL
  Filled 2019-06-30 (×26): qty 1

## 2019-06-30 MED ORDER — ACETAMINOPHEN 325 MG PO TABS
325.0000 mg | ORAL_TABLET | ORAL | Status: DC | PRN
  Administered 2019-07-07: 650 mg via ORAL
  Filled 2019-06-30: qty 2

## 2019-06-30 MED ORDER — PROCHLORPERAZINE EDISYLATE 10 MG/2ML IJ SOLN: 5.0000 mg | Freq: Four times a day (QID) | INTRAMUSCULAR | Status: DC | PRN

## 2019-06-30 MED ORDER — LIDOCAINE 5 % EX PTCH
1.0000 | MEDICATED_PATCH | CUTANEOUS | Status: DC
  Administered 2019-06-30: 1 via TRANSDERMAL
  Filled 2019-06-30: qty 1

## 2019-06-30 MED ORDER — OXYCODONE HCL 5 MG PO TABS
5.0000 mg | ORAL_TABLET | ORAL | Status: DC | PRN
  Administered 2019-06-30 – 2019-07-05 (×23): 10 mg via ORAL
  Filled 2019-06-30 (×24): qty 2

## 2019-06-30 MED ORDER — THIAMINE HCL 100 MG/ML IJ SOLN
100.0000 mg | Freq: Every day | INTRAMUSCULAR | Status: DC
  Administered 2019-07-07: 100 mg via INTRAVENOUS
  Filled 2019-06-30 (×2): qty 2

## 2019-06-30 MED ORDER — TRAZODONE HCL 50 MG PO TABS: 25.0000 mg | ORAL_TABLET | Freq: Every evening | ORAL | Status: DC | PRN

## 2019-06-30 MED ORDER — LACTULOSE 10 GM/15ML PO SOLN
10.0000 g | Freq: Two times a day (BID) | ORAL | Status: DC
  Administered 2019-07-04 – 2019-07-05 (×3): 10 g via ORAL
  Filled 2019-06-30 (×14): qty 15

## 2019-06-30 MED ORDER — ORAL CARE MOUTH RINSE
15.0000 mL | Freq: Two times a day (BID) | OROMUCOSAL | Status: DC
  Administered 2019-07-03 – 2019-07-05 (×3): 15 mL via OROMUCOSAL

## 2019-06-30 MED ORDER — THIAMINE HCL 100 MG PO TABS
100.0000 mg | ORAL_TABLET | Freq: Every day | ORAL | Status: DC
  Administered 2019-07-01 – 2019-07-08 (×6): 100 mg via ORAL
  Filled 2019-06-30 (×6): qty 1

## 2019-06-30 MED ORDER — LORAZEPAM 2 MG/ML IJ SOLN: 1.0000 mg | INTRAMUSCULAR | Status: AC | PRN

## 2019-06-30 MED ORDER — BISACODYL 10 MG RE SUPP: 10.0000 mg | Freq: Every day | RECTAL | Status: DC | PRN

## 2019-06-30 MED ORDER — GUAIFENESIN-DM 100-10 MG/5ML PO SYRP: 5.0000 mL | ORAL_SOLUTION | Freq: Four times a day (QID) | ORAL | Status: DC | PRN

## 2019-06-30 MED ORDER — LORAZEPAM 1 MG PO TABS: 1.0000 mg | ORAL_TABLET | ORAL | Status: DC | PRN

## 2019-06-30 MED ORDER — FOLIC ACID 1 MG PO TABS
1.0000 mg | ORAL_TABLET | Freq: Every day | ORAL | Status: DC
  Administered 2019-07-01 – 2019-07-08 (×7): 1 mg via ORAL
  Filled 2019-06-30 (×7): qty 1

## 2019-06-30 MED ORDER — ACETAMINOPHEN 325 MG PO TABS: 650.0000 mg | ORAL_TABLET | Freq: Four times a day (QID) | ORAL | Status: DC | PRN

## 2019-06-30 MED ORDER — ONDANSETRON HCL 4 MG/2ML IJ SOLN: 4.0000 mg | Freq: Four times a day (QID) | INTRAMUSCULAR | Status: DC | PRN

## 2019-06-30 MED ORDER — TRAMADOL HCL 50 MG PO TABS
50.0000 mg | ORAL_TABLET | Freq: Four times a day (QID) | ORAL | Status: DC | PRN
  Administered 2019-07-05 – 2019-07-08 (×6): 50 mg via ORAL
  Filled 2019-06-30 (×6): qty 1

## 2019-06-30 MED ORDER — PROCHLORPERAZINE 25 MG RE SUPP: 12.5000 mg | Freq: Four times a day (QID) | RECTAL | Status: DC | PRN

## 2019-06-30 MED ORDER — LORAZEPAM 1 MG PO TABS: 0.0000 mg | ORAL_TABLET | Freq: Two times a day (BID) | ORAL | Status: DC

## 2019-06-30 MED ORDER — FLEET ENEMA 7-19 GM/118ML RE ENEM: 1.0000 | ENEMA | Freq: Once | RECTAL | Status: DC | PRN

## 2019-06-30 MED ORDER — ADULT MULTIVITAMIN W/MINERALS CH
1.0000 | ORAL_TABLET | Freq: Every day | ORAL | Status: DC
  Administered 2019-07-01 – 2019-07-08 (×7): 1 via ORAL
  Filled 2019-06-30 (×7): qty 1

## 2019-06-30 MED ORDER — POLYETHYLENE GLYCOL 3350 17 G PO PACK: 17.0000 g | PACK | Freq: Every day | ORAL | Status: DC | PRN

## 2019-06-30 MED ORDER — DIPHENHYDRAMINE HCL 12.5 MG/5ML PO ELIX: 12.5000 mg | ORAL_SOLUTION | Freq: Four times a day (QID) | ORAL | Status: DC | PRN

## 2019-06-30 MED ORDER — PROCHLORPERAZINE MALEATE 5 MG PO TABS: 5.0000 mg | ORAL_TABLET | Freq: Four times a day (QID) | ORAL | Status: DC | PRN

## 2019-06-30 MED ORDER — METHOCARBAMOL 500 MG PO TABS
1000.0000 mg | ORAL_TABLET | Freq: Three times a day (TID) | ORAL | Status: DC
  Administered 2019-06-30 – 2019-07-08 (×23): 1000 mg via ORAL
  Filled 2019-06-30 (×23): qty 2

## 2019-06-30 MED ORDER — ALUM & MAG HYDROXIDE-SIMETH 200-200-20 MG/5ML PO SUSP: 30.0000 mL | ORAL | Status: DC | PRN

## 2019-06-30 MED ORDER — LORAZEPAM 0.5 MG PO TABS: 1.0000 mg | ORAL_TABLET | ORAL | Status: AC | PRN

## 2019-06-30 MED ORDER — ONDANSETRON 4 MG PO TBDP: 4.0000 mg | ORAL_TABLET | Freq: Four times a day (QID) | ORAL | Status: DC | PRN

## 2019-06-30 MED ORDER — LORAZEPAM 1 MG PO TABS: 0.0000 mg | ORAL_TABLET | Freq: Four times a day (QID) | ORAL | Status: DC

## 2019-06-30 MED ORDER — TRAMADOL HCL 50 MG PO TABS
50.0000 mg | ORAL_TABLET | Freq: Four times a day (QID) | ORAL | Status: DC | PRN
  Administered 2019-06-30: 10:00:00 50 mg via ORAL
  Filled 2019-06-30: qty 1

## 2019-06-30 MED ORDER — GABAPENTIN 300 MG PO CAPS
300.0000 mg | ORAL_CAPSULE | Freq: Three times a day (TID) | ORAL | Status: DC
  Administered 2019-06-30 – 2019-07-08 (×30): 300 mg via ORAL
  Filled 2019-06-30 (×30): qty 1
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
## 2019-06-30 NOTE — Plan of Care (Signed)
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
## 2019-06-30 NOTE — Discharge Instructions (Signed)
Conmocin cerebral en los adultos Concussion, Adult  Ardelia Mems conmocin cerebral es una lesin en el cerebro a causa de un golpe fuerte y directo (traumatismo) en la cabeza o el cuerpo. Este golpe directo hace que el cerebro rpidamente se sacuda hacia atrs y [place] adelante dentro del crneo. Una conmocin cerebral tambin puede llamarse traumatismo craneoenceflico (TCE) leve. Curarse de esta lesin Probation officer. Cules son las causas? Las causas de esta afeccin son: Un golpe directo en la cabeza, por ejemplo: Social research officer, government un jugador durante un juego. Recibir Engineer, materials pelea. Golpearse la cabeza sobre una superficie dura. Un movimiento rpido y brusco (sacudida) de la cabeza o el cuello, como en un accidente vehicular. Cules son los signos o los sntomas? Los signos de una conmocin cerebral pueden ser difciles de notar. Usted, los [employer] de su familia y los mdicos pueden pasarlos por Counselling psychologist. Puede ser que usted se vea bien por fuera, pero que no acte o se sienta normal. Sntomas fsicos Dolores de [place]. Estar cansado (fatiga). Tener mareos. Problemas con el equilibrio del cuerpo. Problemas para ver o escuchar. Tener sensibilidad a la luz o al ruido. Malestar estomacal (nuseas) o vmitos. No dormir o Stage manager. Prdida de la sensibilidad (entumecimiento) u hormigueo en el cuerpo. Convulsiones. Sntomas mentales y emocionales Problemas para recordar cosas. Dificultad para enfocar la mente (concentrarse), organizarse o tomar decisiones. Lentitud para pensar, actuar, reaccionar, hablar o leer. Sensacin de Tour manager (irritabilidad). Cambios en el humor. Sentirse preocupado o nervioso (ansioso). Sentirse triste (deprimido). Cmo se trata? El tratamiento para esta afeccin puede incluir lo siguiente: Detener la prctica de deportes o actividades si est lesionado. Si se golpea la cabeza o tiene signos de conmocin cerebral: No vuelva a Chiropodist. Hgase revisar por un mdico antes de retomar sus actividades. Descanse el cuerpo y Arboriculturist. Sea observado cuidadosamente, con frecuencia en su casa. Medicamentos para ayudar a Public house manager los sntomas como: Ganas de Training and development officer. Dolores de [place]. Trastornos del sueo. Evite tomar analgsicos fuertes (opioides) para una conmocin cerebral. Evite el consumo de alcohol y drogas. Pedirle que concurra a una clnica especializada en conmociones cerebrales o a un lugar que lo ayude a recuperarse (centro de rehabilitacin). La recuperacin de una conmocin cerebral puede llevar tiempo. Reanude sus actividades solamente: Cuando est completamente curado. Cuando el mdico lo autorice. Siga estas indicaciones en su casa: Actividad Limite las actividades que requieran pensar mucho o estar muy enfocado, como las siguientes: Deberes escolares o [place]. Mirar televisin. Usar la computadora o el telfono. Jugar juegos de memoria y armar rompecabezas. Reposo. El reposo favorece la curacin del cerebro. Asegrese de hacer lo siguiente: Duerma lo suficiente. La mayora de los adultos debera dormir entre 7 y 31 horas todas las noches. [place]. Tome siestas o haga pausas cuando se sienta cansado. Evite la actividad fsica hasta que el mdico lo autorice. Interrumpa cualquier [employer] sntomas. No realice actividades que pudieran provocar una segunda conmocin cerebral, como andar en bicicleta o practicar deportes. Pregntele al mdico cundo puede volver a sus actividades normales, como la [place], [place], los deportes y Forensic psychologist. Su capacidad para reaccionar podra ser ms lenta. No realice estas actividades si se siente mareado. Indicaciones generales  [employer] de venta libre y los recetados solamente como se lo haya indicado el mdico. No beba alcohol hasta que el mdico lo autorice. Controle sus sntomas y pdale a Midwife  que tambin lo hagan. Despus de una conmocin cerebral, pueden ocurrir [employer]. Los [employer] tienen un mayor riesgo de sufrir problemas graves. Informe a su gerente en el trabajo, a los [employer], al departamento de enfermera de la escuela, al [employer], al instructor o al entrenador acerca de la lesin y los sntomas. Hgales saber lo que usted puede y lo que no Fish farm manager. Concurra a todas las visitas de control como se lo haya indicado el mdico. Esto es importante. Cmo se evita? Es muy importante que evite otra lesin cerebral. En casos poco frecuentes, una nueva lesin puede causar daos cerebrales que no desaparecern, hinchazn del cerebro o la muerte. El riesgo es mayor durante los primeros 7 a 10 das despus de una lesin en la cabeza. A fin de evitar las lesiones: [place] actividades que podran causar una segunda conmocin cerebral, como los deportes de [employer], hasta que su mdico lo autorice. Cuando regrese a los deportes o a las actividades: No choque a otros jugadores. Esta es la forma en que ocurre la mayora de las conmociones cerebrales. [place]. Respete a los otros jugadores. Haga ejercicio con regularidad. Realice entrenamiento de fuerza y equilibrio. Use un casco que le quede bien durante la prctica de deportes, al andar en bicicleta o al Engineer, water actividades. Los cascos pueden ayudar a protegerlo de las lesiones graves en el crneo y el cerebro, pero no los protegen de una conmocin cerebral. Incluso al usar casco, debe tratar de no recibir un golpe en la cabeza. Comunquese con un mdico si: Los sntomas no mejoran o empeoran. Aparecen nuevos sntomas. Sufre una nueva lesin. Solicite ayuda inmediatamente si: Tiene dolores de cabeza intensos o estos empeoran. Se siente dbil o siente adormecimiento en alguna parte del cuerpo. Est desorientado (confundido). Su equilibrio empeora. Sigue vomitando. Tiene ms  sueo que lo habitual. El habla no es clara (arrastra las palabras). No puede Recruitment consultant o lugares. Tiene una convulsin. Otras personas tienen problemas para despertarlo. Tiene cambios de [place]. Tiene cambios en la forma de ver (visin). Se desmaya (pierde el conocimiento). Resumen Una conmocin cerebral es una lesin en el cerebro a causa de un golpe fuerte y Diplomatic Services operational officer (traumatismo) en la cabeza o el cuerpo. Esta afeccin se trata con reposo y un control cuidadoso de los sntomas. Si contina teniendo sntomas, llame a su mdico. Esta informacin no tiene Marine scientist el consejo del mdico. Asegrese de hacerle al mdico cualquier pregunta que tenga. Document Revised: 03/09/2018 Document Reviewed: 03/09/2018 Elsevier Patient Education  2020 [place] de costillas Rib Fracture  Una fractura de costilla es la ruptura o fisura de uno de los huesos que la forman. Las costillas son Earlean [place] jaula que rodea la parte superior del pecho. La fractura o fisura de una costilla puede ser dolorosa pero no causa otros problemas. [place] fracturas de costillas se curan por s solas en un plazo de 1 a 3 meses. Siga estas indicaciones en su casa: Control del dolor, la rigidez y la hinchazn  Si se lo indican, aplique hielo sobre la zona lesionada. ? Ponga el hielo en una bolsa plstica. ? Coloque una [employer] piel y la bolsa de hielo. ? Coloque el hielo durante 16minutos, 2 o 3veces por da.  Tome los medicamentos de venta libre y los recetados solamente como se lo haya indicado el mdico. Actividad  Evite las actividades que puedan causar dolor en la zona lesionada. [place]  zona lesionada.  Aumente lentamente la actividad segn las indicaciones del mdico. Instrucciones generales  Haga ejercicios de respiracin profunda como se lo haya indicado el mdico. Es posible que le indiquen lo siguiente: ? Secondary school teacher respiraciones profundas varias veces al  Training and development officer. ? Tosa varias veces al da mientras abraza una almohada. ? Use un dispositivo (espirmetro de incentivo) para Optometrist respiraciones profundas varias veces al da.  Beba suficiente lquido para mantener el pis (orina) claro o de color amarillo plido.  No use cinturones ni sujetadores. No dejan que respire profundamente.  Concurra a todas las visitas de control como se lo haya indicado el mdico. Esto es importante. Comunquese con un mdico si:  Tiene fiebre. Solicite ayuda de inmediato si:  Tiene dificultad para respirar.  Le falta el aire.  No puede dejar de toser.  Tose y despide saliva espesa o con sangre (esputo).  Tiene Higher education careers adviser (nuseas), vomita o tiene dolor de panza (abdominal).  El dolor empeora y los medicamentos no Community education officer. Resumen  [place] fractura de costilla es la ruptura o fisura de uno de los huesos que la forman.  Aplique hielo sobre la zona lesionada y tome los medicamentos para Glass blower/designer como se lo haya indicado el mdico.  Haga respiraciones profundas y [place]. Abrace una almohada cada vez que tosa. Esta informacin no tiene Marine scientist el consejo del mdico. Asegrese de hacerle al mdico cualquier pregunta que tenga. Document Revised: 02/19/2017 Document Reviewed: 02/19/2017 Elsevier Patient Education  [place].

## 2019-06-30 NOTE — Progress Notes (Signed)
Inpatient Rehabilitation Admissions Coordinator  Inpatient rehab bed is available today. Pt in agreement to admit. I will make the arrangements to admit today. I contacted Maxwell Caul, [place] and RN and will admit.  Danne Baxter, RN, MSN Rehab Admissions Coordinator [phone] 06/30/2019 4:02 PM

## 2019-06-30 NOTE — Progress Notes (Signed)
Patient arrived to the unit in a gown holding a pulmonary pillow. Patient has two peripheral IVs, on each forearm. Patient c/o 10/10 pain on his whole left side, unable to describe it. Policies and procedures were reviewed with patient.
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
## 2019-06-30 NOTE — Plan of Care (Signed)
  Problem: RH BLADDER ELIMINATION Goal: RH STG MANAGE BLADDER WITH ASSISTANCE Description: STG Manage Bladder With Assistance 06/30/2019 2137 by Caroline More, RN Outcome: Progressing 06/30/2019 2136 by Caroline More, RN Flowsheets (Taken 06/30/2019 2136) STG: Pt will manage bladder with assistance: 3-Moderate assistance   Problem: RH SKIN INTEGRITY Goal: RH STG SKIN FREE OF INFECTION/BREAKDOWN Outcome: Progressing   Problem: RH SAFETY Goal: RH STG ADHERE TO SAFETY PRECAUTIONS W/ASSISTANCE/DEVICE Description: STG Adhere to Safety Precautions With Assistance/Device. Outcome: Progressing
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
## 2019-06-30 NOTE — Progress Notes (Signed)
Inpatient Rehabilitation Admissions Coordinator  I spoke by phone with pt's [place] sponsor, Madlyn Frankel and she is aware of plan for pt to do hopeful CIR then d/c home with [name] and his Mom. I met with patient at bedside and he is speaking to [place] daily giving her updates. I currently do not have CIR bed available. Pt prefers inpt rehab then d/c home. I will follow up tomorrow to clarify potential for CIR bed availability for this pt.  Danne Baxter, RN, MSN Rehab Admissions Coordinator [phone] 06/30/2019 11:38 AM
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
## 2019-07-01 ENCOUNTER — Inpatient Hospital Stay (HOSPITAL_COMMUNITY): Payer: Medicaid Other

## 2019-07-01 ENCOUNTER — Inpatient Hospital Stay (HOSPITAL_COMMUNITY): Payer: Medicaid Other | Admitting: Physical Therapy

## 2019-07-01 ENCOUNTER — Inpatient Hospital Stay (HOSPITAL_COMMUNITY): Payer: Medicaid Other | Admitting: Speech Pathology

## 2019-07-01 DIAGNOSIS — B182 Chronic viral hepatitis C: Secondary | ICD-10-CM

## 2019-07-01 DIAGNOSIS — R160 Hepatomegaly, not elsewhere classified: Secondary | ICD-10-CM

## 2019-07-01 DIAGNOSIS — D696 Thrombocytopenia, unspecified: Secondary | ICD-10-CM

## 2019-07-01 DIAGNOSIS — S069X4D Unspecified intracranial injury with loss of consciousness of 6 hours to 24 hours, subsequent encounter: Secondary | ICD-10-CM

## 2019-07-01 DIAGNOSIS — S065XAA Traumatic subdural hemorrhage with loss of consciousness status unknown, initial encounter: Secondary | ICD-10-CM | POA: Diagnosis present

## 2019-07-01 DIAGNOSIS — S065X9A Traumatic subdural hemorrhage with loss of consciousness of unspecified duration, initial encounter: Secondary | ICD-10-CM

## 2019-07-01 DIAGNOSIS — B1910 Unspecified viral hepatitis B without hepatic coma: Secondary | ICD-10-CM

## 2019-07-01 DIAGNOSIS — F10220 Alcohol dependence with intoxication, uncomplicated: Secondary | ICD-10-CM

## 2019-07-01 DIAGNOSIS — K7030 Alcoholic cirrhosis of liver without ascites: Secondary | ICD-10-CM

## 2019-07-01 LAB — COMPREHENSIVE METABOLIC PANEL
ALT: 40 U/L (ref 0–44)
AST: 99 U/L — ABNORMAL HIGH (ref 15–41)
Albumin: 1.9 g/dL — ABNORMAL LOW (ref 3.5–5.0)
Alkaline Phosphatase: 152 U/L — ABNORMAL HIGH (ref 38–126)
Anion gap: 7 (ref 5–15)
BUN: 11 mg/dL (ref 6–20)
CO2: 20 mmol/L — ABNORMAL LOW (ref 22–32)
Calcium: 7.8 mg/dL — ABNORMAL LOW (ref 8.9–10.3)
Chloride: 106 mmol/L (ref 98–111)
Creatinine, Ser: 0.72 mg/dL (ref 0.61–1.24)
GFR calc Af Amer: >60 mL/min (ref >60)
GFR calc non Af Amer: >60 mL/min (ref >60)
Glucose, Bld: 88 mg/dL (ref 70–99)
Potassium: 4.4 mmol/L (ref 3.5–5.1)
Sodium: 133 mmol/L — ABNORMAL LOW (ref 135–145)
Total Bilirubin: 3.1 mg/dL — ABNORMAL HIGH (ref 0.3–1.2)
Total Protein: 6.5 g/dL (ref 6.5–8.1)

## 2019-07-01 LAB — CBC WITH DIFFERENTIAL/PLATELET
Abs Immature Granulocytes: 0.33 10*3/uL — ABNORMAL HIGH (ref 0.00–0.07)
Basophils Absolute: 0.1 10*3/uL (ref 0.0–0.1)
Basophils Relative: 1 %
Eosinophils Absolute: 0.6 10*3/uL — ABNORMAL HIGH (ref 0.0–0.5)
Eosinophils Relative: 6 %
HCT: 30.2 % — ABNORMAL LOW (ref 39.0–52.0)
Hemoglobin: 10.4 g/dL — ABNORMAL LOW (ref 13.0–17.0)
Immature Granulocytes: 3 %
Lymphocytes Relative: 31 %
Lymphs Abs: 3.3 10*3/uL (ref 0.7–4.0)
MCH: 31.8 pg (ref 26.0–34.0)
MCHC: 34.4 g/dL (ref 30.0–36.0)
MCV: 92.4 fL (ref 80.0–100.0)
Monocytes Absolute: 2.9 10*3/uL — ABNORMAL HIGH (ref 0.1–1.0)
Monocytes Relative: 27 %
Neutro Abs: 3.4 10*3/uL (ref 1.7–7.7)
Neutrophils Relative %: 32 %
Platelets: 93 10*3/uL — ABNORMAL LOW (ref 150–400)
RBC: 3.27 MIL/uL — ABNORMAL LOW (ref 4.22–5.81)
RDW: 20.4 % — ABNORMAL HIGH (ref 11.5–15.5)
WBC: 10.7 10*3/uL — ABNORMAL HIGH (ref 4.0–10.5)
nRBC: 0.5 % — ABNORMAL HIGH (ref 0.0–0.2)

## 2019-07-01 MED ORDER — LIDOCAINE 5 % EX PTCH
3.0000 | MEDICATED_PATCH | CUTANEOUS | Status: DC
  Administered 2019-07-01 – 2019-07-07 (×7): 3 via TRANSDERMAL
  Filled 2019-07-01 (×7): qty 3

## 2019-07-01 MED ORDER — SENNA 8.6 MG PO TABS
1.0000 | ORAL_TABLET | Freq: Two times a day (BID) | ORAL | Status: DC
  Administered 2019-07-01 – 2019-07-03 (×2): 8.6 mg via ORAL
  Filled 2019-07-01 (×5): qty 1

## 2019-07-01 MED ORDER — ENOXAPARIN SODIUM 40 MG/0.4ML ~~LOC~~ SOLN
40.0000 mg | SUBCUTANEOUS | Status: DC
  Administered 2019-07-01 – 2019-07-07 (×7): 40 mg via SUBCUTANEOUS
  Filled 2019-07-01 (×7): qty 0.4
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
## 2019-07-01 NOTE — Plan of Care (Signed)
  Problem: RH BLADDER ELIMINATION Goal: RH STG MANAGE BLADDER WITH ASSISTANCE Description: STG Manage Bladder With Assistance Outcome: Progressing   Problem: RH SKIN INTEGRITY Goal: RH STG SKIN FREE OF INFECTION/BREAKDOWN Outcome: Progressing   Problem: RH SAFETY Goal: RH STG ADHERE TO SAFETY PRECAUTIONS W/ASSISTANCE/DEVICE Description: STG Adhere to Safety Precautions With Assistance/Device. Outcome: Progressing   Problem: RH PAIN MANAGEMENT Goal: RH STG PAIN MANAGED AT OR BELOW PT'S PAIN GOAL Outcome: Not Progressing

## 2019-07-01 NOTE — Progress Notes (Signed)
Inpatient Rehabilitation  Patient information reviewed and entered into eRehab system by Savior Himebaugh M. Loucile Posner, M.A., CCC/SLP, PPS Coordinator.  Information including medical coding, functional ability and quality indicators will be reviewed and updated through discharge.    
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
## 2019-07-02 ENCOUNTER — Inpatient Hospital Stay (HOSPITAL_COMMUNITY): Payer: Medicaid Other | Admitting: Physical Therapy

## 2019-07-02 ENCOUNTER — Inpatient Hospital Stay (HOSPITAL_COMMUNITY): Payer: Medicaid Other | Admitting: Occupational Therapy

## 2019-07-02 DIAGNOSIS — S069X0S Unspecified intracranial injury without loss of consciousness, sequela: Secondary | ICD-10-CM

## 2019-07-02 NOTE — Progress Notes (Signed)
Physical Therapy Session Note  Patient Details  Name: Antonio [name] MRN: [id number] Date of Birth: [birth year]  Today's Date: 07/02/2019 PT Individual Time: [id number] PT Individual Time Calculation (min): 20 min   Short Term Goals: Week 1:  PT Short Term Goal 1 (Week 1): =LTGs due to ELOS  Skilled Therapeutic Interventions/Progress Updates:  Pt was seen bedside in the pm. Pt c/o 10/10 rib pain, stating the medicine is not working. Pt stated but he needed to try and walk today. Pt transferred supine to edge of bed with min A and verbal cues. Pt transferred sit to stand with c/g to min A and verbal cues. Pt ambulated 350 feet with rolling walker, initially requiring min A progressing to c/g. Pt returned to room. Pt transferred edge of bed to supine with S. Pt stated that's all I can do today. Pt educated on the benefits of therapy. Pt continued to state that was it for today, but will try again tomorrow. Pt left sitting up in bed with call bell within reach and bed alarm on.   Therapy Documentation Precautions:  Precautions Precautions: Fall Precaution Comments: Left chest tube removed 1/6 am Restrictions Weight Bearing Restrictions: No General: PT Amount of Missed Time (min): 40 Minutes PT Missed Treatment Reason: Pain Pain: Pt c/o 10/10 rib pain.   Therapy/Group: Individual Therapy  Dub Amis 07/02/2019, 2:56 PM
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
## 2019-07-02 NOTE — Progress Notes (Signed)
Pt stated that he didn't sleep well due to being in so much pain. Oxy 10 mg given every 4 hours, no relief. Kpad was ordered by Dr. Morrison Old.

## 2019-07-03 ENCOUNTER — Inpatient Hospital Stay (HOSPITAL_COMMUNITY): Payer: Medicaid Other

## 2019-07-03 NOTE — Progress Notes (Signed)
Occupational Therapy Session Note  Patient Details  Name: Antonio [name] MRN: [id number] Date of Birth: [birth year]  Today's Date: 07/03/2019 OT Individual Time: 1015-1040 OT Individual Time Calculation (min): 25 min   Session 2: OT Individual Time: 1515-1600 OT Individual Time Calculation (min): 45 min    Short Term Goals: Week 1:  OT Short Term Goal 1 (Week 1): STG=LTG d/t ELOS  Skilled Therapeutic Interventions/Progress Updates:    Pt received supine with 8/10 rib pain. Pt reporting medicine "doesn't do anything" and requesting to walk. 300+ ft of functional mobility at CGA, fading to (S) level using RW. Pt challenged by removing RW and he completed another 100 ft without any AD, CGA. Pt edu on use of RW for balance and for energy conservation. Throughout functional mobility, discussed d/c planning and need for home support. Pt returned to room and requested linen change. Pt initially assisting but pain became too high and he was able to stand with (S) while new sheets were applied. Pt able to use pillow to brace ribs while coughing, expelling some phlegm. Pt was left supine with all needs met, bed alarm set.   Session 2: Pt received supine, agreeable to session. Pt requesting to focus on gross motor and functional activity tolerance this session. Pt completed bed mobility with (S). Pt reporting pain in ribs 9/10. Pt completed 200 ft of functional mobility with (S) using the RW. Pt attempted to use Nustep but found after 2 min that it was causing too much pain and requested to complete more functional mobility. Pt completed another 200 ft of functional mobility with cueing provided re RW management and reducing BUE reliance for rib pain relief. Pt completed dynamic standing balance activity- ascending and descending stairs with (S). Pt used the Biodex for standing balance training with min cueing for weight shifting before reporting pain was too high to continue. Pt continuously declined any  non-medication related pain relief attempts including heat, ice, and shower. Pt returned to his room and was left supine with all needs met bed alarm set.   Therapy Documentation Precautions:  Precautions Precautions: Fall Precaution Comments: Left chest tube removed 1/6 am Restrictions Weight Bearing Restrictions: No   Therapy/Group: Individual Therapy  Curtis Sites 07/03/2019, 6:46 AM

## 2019-07-03 NOTE — Plan of Care (Signed)
  Problem: Consults Goal: RH GENERAL PATIENT EDUCATION Description: See Patient Education module for education specifics. Outcome: Progressing Goal: Skin Care Protocol Initiated - if Braden Score 18 or less Description: If consults are not indicated, leave blank or document N/A Outcome: Progressing Goal: Nutrition Consult-if indicated Outcome: Progressing Goal: Diabetes Guidelines if Diabetic/Glucose > 140 Description: If diabetic or lab glucose is > 140 mg/dl - Initiate Diabetes/Hyperglycemia Guidelines & Document Interventions  Outcome: Progressing   Problem: RH BOWEL ELIMINATION Goal: RH STG MANAGE BOWEL WITH ASSISTANCE Description: STG Manage Bowel with Assistance. Outcome: Progressing Goal: RH STG MANAGE BOWEL W/MEDICATION W/ASSISTANCE Description: STG Manage Bowel with Medication with Assistance. Outcome: Progressing   Problem: RH BLADDER ELIMINATION Goal: RH STG MANAGE BLADDER WITH ASSISTANCE Description: STG Manage Bladder With Assistance Outcome: Progressing Goal: RH STG MANAGE BLADDER WITH MEDICATION WITH ASSISTANCE Description: STG Manage Bladder With Medication With Assistance. Outcome: Progressing Goal: RH STG MANAGE BLADDER WITH EQUIPMENT WITH ASSISTANCE Description: STG Manage Bladder With Equipment With Assistance Outcome: Progressing   Problem: RH SKIN INTEGRITY Goal: RH STG SKIN FREE OF INFECTION/BREAKDOWN Outcome: Progressing Goal: RH STG MAINTAIN SKIN INTEGRITY WITH ASSISTANCE Description: STG Maintain Skin Integrity With Assistance. Outcome: Progressing Goal: RH STG ABLE TO PERFORM INCISION/WOUND CARE W/ASSISTANCE Description: STG Able To Perform Incision/Wound Care With Assistance. Outcome: Progressing   Problem: RH SAFETY Goal: RH STG ADHERE TO SAFETY PRECAUTIONS W/ASSISTANCE/DEVICE Description: STG Adhere to Safety Precautions With Assistance/Device. Outcome: Progressing Goal: RH STG DECREASED RISK OF FALL WITH ASSISTANCE Description: STG  Decreased Risk of Fall With Assistance. Outcome: Progressing   Problem: RH PAIN MANAGEMENT Goal: RH STG PAIN MANAGED AT OR BELOW PT'S PAIN GOAL Outcome: Progressing   Problem: RH KNOWLEDGE DEFICIT GENERAL Goal: RH STG INCREASE KNOWLEDGE OF SELF CARE AFTER HOSPITALIZATION Outcome: Progressing   
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
## 2019-07-03 NOTE — Progress Notes (Signed)
No distress noted. Eyes closed, respirations even and unlabored

## 2019-07-04 ENCOUNTER — Inpatient Hospital Stay (HOSPITAL_COMMUNITY): Payer: Medicaid Other

## 2019-07-04 ENCOUNTER — Inpatient Hospital Stay (HOSPITAL_COMMUNITY): Payer: Medicaid Other | Admitting: Occupational Therapy

## 2019-07-04 LAB — CBC
HCT: 30.1 % — ABNORMAL LOW (ref 39.0–52.0)
Hemoglobin: 10.1 g/dL — ABNORMAL LOW (ref 13.0–17.0)
MCH: 30.9 pg (ref 26.0–34.0)
MCHC: 33.6 g/dL (ref 30.0–36.0)
MCV: 92 fL (ref 80.0–100.0)
Platelets: 148 10*3/uL — ABNORMAL LOW (ref 150–400)
RBC: 3.27 MIL/uL — ABNORMAL LOW (ref 4.22–5.81)
RDW: 19.3 % — ABNORMAL HIGH (ref 11.5–15.5)
WBC: 11.5 10*3/uL — ABNORMAL HIGH (ref 4.0–10.5)
nRBC: 0.2 % (ref 0.0–0.2)

## 2019-07-04 LAB — BASIC METABOLIC PANEL
Anion gap: 7 (ref 5–15)
BUN: 11 mg/dL (ref 6–20)
CO2: 21 mmol/L — ABNORMAL LOW (ref 22–32)
Calcium: 7.5 mg/dL — ABNORMAL LOW (ref 8.9–10.3)
Chloride: 107 mmol/L (ref 98–111)
Creatinine, Ser: 0.62 mg/dL (ref 0.61–1.24)
GFR calc Af Amer: >60 mL/min (ref >60)
GFR calc non Af Amer: >60 mL/min (ref >60)
Glucose, Bld: 71 mg/dL (ref 70–99)
Potassium: 4.3 mmol/L (ref 3.5–5.1)
Sodium: 135 mmol/L (ref 135–145)

## 2019-07-04 MED ORDER — ACETAMINOPHEN 325 MG PO TABS: 325.0000 mg | ORAL_TABLET | ORAL | Status: AC | PRN

## 2019-07-04 MED ORDER — SENNA 8.6 MG PO TABS: 1.0000 | ORAL_TABLET | Freq: Every day | ORAL | Status: DC | PRN

## 2019-07-04 NOTE — Progress Notes (Signed)
Occupational Therapy Session Note  Patient Details  Name: Antonio [name] MRN: [id number] Date of Birth: 12-20-1970  Today's Date: 07/04/2019 OT Individual Time: 1400-1430  &  1455-1525 OT Individual Time Calculation (min): 30 min & 30 min   Short Term Goals: Week 1:  OT Short Term Goal 1 (Week 1): STG=LTG d/t ELOS  Skilled Therapeutic Interventions/Progress Updates:    patient seated in bed, alert and aware of needs.  Completed bed mobility with CS (HOB elevated for comfort), sit to stand and ambulation in room with RW to/from bed, toilet and w/c with CGA.  toileting completed with CS.  Completed laundry task with CGA to place items in washer and start the cycle.  Completed kinetron for approx 5 minutes.  Returned to bed with CGA.  Session suspended for 25 minutes so that he could complete additional laundry task when washer was complete -short distance ambulation in room with CGA, occ episodes of dizziness that resolve upon sitting.   he was able to move laundry from washer to dryer with CS, provided with reacher but he did not need it to complete this task.  Returned to bed at close of session with CGA.  Reviewed and practice incentive spirometer with good results.  He remained in bed at close of session, bed alarm set and call bell in reach.    Therapy Documentation Precautions:  Precautions Precautions: Fall Precaution Comments: Left chest tube removed 1/6 am Restrictions Weight Bearing Restrictions: No General:   Vital Signs: Therapy Vitals Temp: 99 F (37.2 C) Pulse Rate: 85 Resp: 18 BP: 133/80 Patient Position (if appropriate): Lying Oxygen Therapy SpO2: 96 % O2 Device: Room Air Pain: Pain Assessment Pain Scale: 0-10 Pain Score: 4  Pain Type: Acute pain Pain Location: Rib cage Pain Orientation: Left Pain Descriptors / Indicators: Discomfort Pain Intervention(s): Repositioned Other Treatments:     Therapy/Group: Individual Therapy  Carlos Levering 07/04/2019,  3:59 PM

## 2019-07-04 NOTE — Plan of Care (Signed)
Behavioral Plan   [place] Level:  Behavior to decrease/ eliminate:  -Decrease risk of falls -Patient compliance with medications -Reduce rib pain with therapy for increased particiaption   Changes to environment:  -Bed alarm on lightest setting -Seat belt alarm when in sitting   Interventions:  -Assess dizziness in standing -Orthostatic vitals -Education about all medications    Recommendations for interactions with patient:  -Patient does not like crowded spaces   Attendees:  Apolinar Junes, PT, [name], OT, and Vernie Murders, RN
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
## 2019-07-04 NOTE — Care Management (Signed)
[facility] Individual Statement of Services  Patient Name:  Antonio [name]  Date:  07/01/2019  Welcome to the [facility].  Our goal is to provide you with an individualized program based on your diagnosis and situation, designed to meet your specific needs.  With this comprehensive rehabilitation program, you will be expected to participate in at least 3 hours of rehabilitation therapies Monday-Friday, with modified therapy programming on the weekends.  Your rehabilitation program will include the following services:  Physical Therapy (PT), Occupational Therapy (OT), Speech Therapy (ST), 24 hour per day rehabilitation nursing, Therapeutic Recreaction (TR), Neuropsychology, Case Management (Social Worker), Rehabilitation Medicine, Nutrition Services and Pharmacy Services  Weekly team conferences will be held on Tuesdays to discuss your progress.  Your Social Worker will talk with you frequently to get your input and to update you on team discussions.  Team conferences with you and your family in attendance may also be held.  Expected length of stay: 7-10 days   Overall anticipated outcome: supervision  Depending on your progress and recovery, your program may change. Your Social Worker will coordinate services and will keep you informed of any changes. Your Social Worker's name and contact numbers are listed  below.  The following services may also be recommended but are not provided by the [facility] will be made to provide these services after discharge if needed.  Arrangements include referral to agencies that provide these services.  Your insurance has been verified to be:  none Your primary doctor is:  Nathaneil Canary  Pertinent information will be shared with your doctor and your insurance  company.  Social Worker:  [place], [place] or ([phone]   Information discussed with and copy given to patient by: Lennart Pall, 07/01/2019, 12:12 PM
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
## 2019-07-04 NOTE — Progress Notes (Signed)
Occupational Therapy Session Note  Patient Details  Name: Antonio [name] MRN: [id number] Date of Birth: 1971/06/23  Today's Date: 07/04/2019 OT Individual Time: [id number] OT Individual Time Calculation (min): 58 min    Short Term Goals: Week 1:  OT Short Term Goal 1 (Week 1): STG=LTG d/t ELOS  Skilled Therapeutic Interventions/Progress Updates:    Patient seated in bed, alert, aware of surroundings but requires cues for date.  Patient notes that his STM has been poor for a long time.  Bed mobility completed with CS.  Sit to stand and ambulation in room without AD CGA/steadying assist - occ mild LOB.  Completed shower seated on shower bench with CS, UB dressing with min A due to rib cage pain, LB dressing min A.  Non-skid slipper socks max A.  Oral care with set up and cues seated in arm chair at sink.  He sat edge of bed with CS to organize his belongings and put together laundry.  No impulsive behavior noted.  He is pleasant and cooperative t/o session.  He returned to bed at close of session, bed alarm set and call bell in reach.    Therapy Documentation Precautions:  Precautions Precautions: Fall Precaution Comments: Left chest tube removed 1/6 am Restrictions Weight Bearing Restrictions: No General:   Vital Signs:   Pain: Pain Assessment Pain Scale: 0-10 Pain Score: 8  Pain Type: Acute pain Pain Location: Rib cage Pain Orientation: Left Pain Descriptors / Indicators: Aching Pain Frequency: Constant Pain Intervention(s): Repositioned;Shower Other Treatments:     Therapy/Group: Individual Therapy  Carlos Levering 07/04/2019, 9:54 AM
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
## 2019-07-04 NOTE — Plan of Care (Signed)
  Problem: Consults Goal: RH BRAIN INJURY PATIENT EDUCATION Description: Description: See Patient Education module for eduction specifics Outcome: Progressing Goal: Skin Care Protocol Initiated - if Braden Score 18 or less Description: If consults are not indicated, leave blank or document N/A Outcome: Progressing Goal: Nutrition Consult-if indicated Outcome: Progressing Goal: Diabetes Guidelines if Diabetic/Glucose > 140 Description: If diabetic or lab glucose is > 140 mg/dl - Initiate Diabetes/Hyperglycemia Guidelines & Document Interventions  Outcome: Progressing   Problem: RH BLADDER ELIMINATION Goal: RH STG MANAGE BLADDER WITH ASSISTANCE Description: STG Manage Bladder With Minimal Assistance Outcome: Progressing   Problem: RH SKIN INTEGRITY Goal: RH STG SKIN FREE OF INFECTION/BREAKDOWN Description: Patient will verbalize signs and symptoms of infection. Outcome: Progressing   Problem: RH SAFETY Goal: RH STG ADHERE TO SAFETY PRECAUTIONS W/ASSISTANCE/DEVICE Description: STG Adhere to Safety Precautions With Minimal Assistance/Device. Outcome: Progressing   Problem: RH PAIN MANAGEMENT Goal: RH STG PAIN MANAGED AT OR BELOW PT'S PAIN GOAL Description: Patient will have a pain score <3 by discharge. Outcome: Progressing   Problem: RH KNOWLEDGE DEFICIT BRAIN INJURY Goal: RH STG INCREASE KNOWLEDGE OF SELF CARE AFTER BRAIN INJURY Description: Patient will manage ADLs with minimal assistance. Outcome: Progressing

## 2019-07-05 ENCOUNTER — Encounter (HOSPITAL_COMMUNITY): Payer: Medicaid Other | Admitting: Psychology

## 2019-07-05 ENCOUNTER — Inpatient Hospital Stay (HOSPITAL_COMMUNITY): Payer: Medicaid Other

## 2019-07-05 ENCOUNTER — Inpatient Hospital Stay (HOSPITAL_COMMUNITY): Payer: Medicaid Other | Admitting: Occupational Therapy

## 2019-07-05 MED ORDER — OXYCODONE HCL 5 MG PO TABS: 5.0000 mg | ORAL_TABLET | Freq: Every day | ORAL | Status: DC | PRN

## 2019-07-05 MED ORDER — OXYCODONE HCL 5 MG PO TABS: 10.0000 mg | ORAL_TABLET | Freq: Three times a day (TID) | ORAL | Status: DC | PRN

## 2019-07-05 MED ORDER — OXYCODONE HCL 5 MG PO TABS: 5.0000 mg | ORAL_TABLET | Freq: Four times a day (QID) | ORAL | Status: DC | PRN

## 2019-07-05 MED ORDER — OXYCODONE HCL 5 MG PO TABS: 10.0000 mg | ORAL_TABLET | Freq: Four times a day (QID) | ORAL | Status: DC | PRN

## 2019-07-05 MED ORDER — OXYCODONE HCL 5 MG PO TABS: 5.0000 mg | ORAL_TABLET | Freq: Two times a day (BID) | ORAL | Status: DC | PRN

## 2019-07-05 MED ORDER — OXYCODONE HCL 5 MG PO TABS: 5.0000 mg | ORAL_TABLET | Freq: Three times a day (TID) | ORAL | Status: DC | PRN

## 2019-07-05 MED ORDER — OXYCODONE HCL 5 MG PO TABS: 10.0000 mg | ORAL_TABLET | Freq: Two times a day (BID) | ORAL | Status: DC | PRN

## 2019-07-05 MED ORDER — OXYCODONE HCL 5 MG PO TABS: 10.0000 mg | ORAL_TABLET | Freq: Every day | ORAL | Status: DC | PRN

## 2019-07-05 MED ORDER — OXYCODONE HCL 5 MG PO TABS
5.0000 mg | ORAL_TABLET | Freq: Four times a day (QID) | ORAL | Status: DC | PRN
  Administered 2019-07-05 (×3): 10 mg via ORAL
  Administered 2019-07-06: 5 mg via ORAL
  Administered 2019-07-06 – 2019-07-07 (×4): 10 mg via ORAL
  Filled 2019-07-05 (×9): qty 2
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
## 2019-07-05 NOTE — Plan of Care (Signed)
  Problem: Consults Goal: RH BRAIN INJURY PATIENT EDUCATION Description: Description: See Patient Education module for eduction specifics Outcome: Progressing Goal: Skin Care Protocol Initiated - if Braden Score 18 or less Description: If consults are not indicated, leave blank or document N/A Outcome: Progressing Goal: Nutrition Consult-if indicated Outcome: Progressing Goal: Diabetes Guidelines if Diabetic/Glucose > 140 Description: If diabetic or lab glucose is > 140 mg/dl - Initiate Diabetes/Hyperglycemia Guidelines & Document Interventions  Outcome: Progressing   Problem: RH BLADDER ELIMINATION Goal: RH STG MANAGE BLADDER WITH ASSISTANCE Description: STG Manage Bladder With Mod I Assistance Outcome: Progressing   Problem: RH SKIN INTEGRITY Goal: RH STG SKIN FREE OF INFECTION/BREAKDOWN Description: Patient will verbalize signs and symptoms of infection. Outcome: Progressing   Problem: RH SAFETY Goal: RH STG ADHERE TO SAFETY PRECAUTIONS W/ASSISTANCE/DEVICE Description: STG Adhere to Safety Precautions With cues and remiders Assistance/Device. Outcome: Progressing   Problem: RH PAIN MANAGEMENT Goal: RH STG PAIN MANAGED AT OR BELOW PT'S PAIN GOAL Description: Patient will have a pain score <3 by discharge. Outcome: Progressing   Problem: RH KNOWLEDGE DEFICIT BRAIN INJURY Goal: RH STG INCREASE KNOWLEDGE OF SELF CARE AFTER BRAIN INJURY Description: Patient will manage ADLs with minimal assistance. Outcome: Progressing

## 2019-07-05 NOTE — Plan of Care (Signed)
  Problem: Consults Goal: RH BRAIN INJURY PATIENT EDUCATION Description: Description: See Patient Education module for eduction specifics Outcome: Progressing Goal: Skin Care Protocol Initiated - if Braden Score 18 or less Description: If consults are not indicated, leave blank or document N/A Outcome: Progressing Goal: Nutrition Consult-if indicated Outcome: Progressing Goal: Diabetes Guidelines if Diabetic/Glucose > 140 Description: If diabetic or lab glucose is > 140 mg/dl - Initiate Diabetes/Hyperglycemia Guidelines & Document Interventions  Outcome: Progressing   Problem: RH BLADDER ELIMINATION Goal: RH STG MANAGE BLADDER WITH ASSISTANCE Description: STG Manage Bladder With Minimal Assistance Outcome: Progressing   Problem: RH SKIN INTEGRITY Goal: RH STG SKIN FREE OF INFECTION/BREAKDOWN Description: Patient will verbalize signs and symptoms of infection. Outcome: Progressing   Problem: RH SAFETY Goal: RH STG ADHERE TO SAFETY PRECAUTIONS W/ASSISTANCE/DEVICE Description: STG Adhere to Safety Precautions With Minimal Assistance/Device. Outcome: Progressing   Problem: RH PAIN MANAGEMENT Goal: RH STG PAIN MANAGED AT OR BELOW PT'S PAIN GOAL Description: Patient will have a pain score <3 by discharge. Outcome: Progressing   Problem: RH KNOWLEDGE DEFICIT BRAIN INJURY Goal: RH STG INCREASE KNOWLEDGE OF SELF CARE AFTER BRAIN INJURY Description: Patient will manage ADLs with minimal assistance. Outcome: Progressing

## 2019-07-05 NOTE — Plan of Care (Signed)
  Problem: Consults Goal: [facility] BRAIN INJURY PATIENT EDUCATION Description: Description: See Patient Education module for eduction specifics 07/05/2019 2339 by Sandrea Hammond, LPN Outcome: Progressing 07/05/2019 2335 by Sandrea Hammond, LPN Outcome: Progressing Goal: Skin Care Protocol Initiated - if Braden Score 18 or less Description: If consults are not indicated, leave blank or document N/A 07/05/2019 2339 by Sandrea Hammond, LPN Outcome: Progressing 07/05/2019 2335 by Sandrea Hammond, LPN Outcome: Progressing Goal: Nutrition Consult-if indicated 07/05/2019 2339 by Sandrea Hammond, LPN Outcome: Progressing 07/05/2019 2335 by Sandrea Hammond, LPN Outcome: Progressing Goal: Diabetes Guidelines if Diabetic/Glucose > 140 Description: If diabetic or lab glucose is > 140 mg/dl - Initiate Diabetes/Hyperglycemia Guidelines & Document Interventions  07/05/2019 2339 by Sandrea Hammond, LPN Outcome: Progressing 07/05/2019 2335 by Sandrea Hammond, LPN Outcome: Progressing   Problem: RH BLADDER ELIMINATION Goal: RH STG MANAGE BLADDER WITH ASSISTANCE Description: STG Manage Bladder With Mod I Assistance 07/05/2019 2339 by Sandrea Hammond, LPN Outcome: Progressing 07/05/2019 2335 by Sandrea Hammond, LPN Outcome: Progressing   Problem: RH SKIN INTEGRITY Goal: RH STG SKIN FREE OF INFECTION/BREAKDOWN Description: Patient will verbalize signs and symptoms of infection. 07/05/2019 2339 by Sandrea Hammond, LPN Outcome: Progressing 07/05/2019 2335 by Sandrea Hammond, LPN Outcome: Progressing   Problem: RH SAFETY Goal: RH STG ADHERE TO SAFETY PRECAUTIONS W/ASSISTANCE/DEVICE Description: STG Adhere to Safety Precautions With cues and remiders Assistance/Device. 07/05/2019 2339 by Sandrea Hammond, LPN Outcome: Progressing 07/05/2019 2335 by Sandrea Hammond, LPN Outcome: Progressing   Problem: RH PAIN MANAGEMENT Goal: RH STG PAIN MANAGED AT OR BELOW PT'S PAIN GOAL Description: Patient will have a pain score <3 by  discharge. 07/05/2019 2339 by Sandrea Hammond, LPN Outcome: Progressing 07/05/2019 2335 by Sandrea Hammond, LPN Outcome: Progressing   Problem: RH KNOWLEDGE DEFICIT BRAIN INJURY Goal: RH STG INCREASE KNOWLEDGE OF SELF CARE AFTER BRAIN INJURY Description: Patient will manage ADLs with minimal assistance. 07/05/2019 2339 by Sandrea Hammond, LPN Outcome: Progressing 07/05/2019 2335 by Sandrea Hammond, LPN Outcome: Progressing

## 2019-07-05 NOTE — Progress Notes (Signed)
Occupational Therapy Session Note  Patient Details  Name: Phoenix Drey MRN: [id number] Date of Birth: 28-Jan-1971  Today's Date: 07/05/2019 OT Individual Time: 0900-1000 OT Individual Time Calculation (min): 60 min    Short Term Goals: Week 1:  OT Short Term Goal 1 (Week 1): STG=LTG d/t ELOS  Skilled Therapeutic Interventions/Progress Updates:    Patient seated in bed, alert and ready for therapy session.  Bed mobility with CS.  Sit to stand and ambulation in room with and without RW CS - no LOB today or c/o dizziness.  Completed UB dressing with set up.  Ambulation on unit to/from therapy gym x2 with and without RW CS.  Completed dynavision - reaction time program in stance for 2 minutes using bilateral UEs:   Trial 1 = 1.11sec, trial 2 = 1.07sec - no c/o pain with reaching.  Completed UB ergometer at slow rate in stance for 5 minutes.  Returned to bed at close of session, bed alarm set and call bell in reach.    Therapy Documentation Precautions:  Precautions Precautions: Fall Precaution Comments: Left chest tube removed 1/6 am Restrictions Weight Bearing Restrictions: No General:   Vital Signs:   Pain: Pain Assessment Pain Scale: 0-10 Pain Score: 10-Worst pain ever Pain Type: Acute pain Pain Location: Rib cage Pain Orientation: Left Pain Descriptors / Indicators: Aching Pain Intervention(s): Repositioned   Therapy/Group: Individual Therapy  Carlos Levering 07/05/2019, 12:19 PM
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
## 2019-07-05 NOTE — Progress Notes (Signed)
Physical Therapy Session Note  Patient Details  Name: Antonio [name] MRN: [id number] Date of Birth: [birth year]  Today's Date: 07/05/2019 PT Individual Time: 1300-1355 PT Individual Time Calculation (min): 55 min   Short Term Goals: Week 1:  PT Short Term Goal 1 (Week 1): =LTGs due to ELOS  Skilled Therapeutic Interventions/Progress Updates:     Patient in bed upon PT arrival. Patient alert and agreeable to PT session. Patient reported 8/10 abdominal pain during session, RN made aware. PT provided repositioning, rest breaks, and distraction as pain interventions throughout session.   Therapeutic Activity: Bed Mobility: Patient performed supine to/from sit with supervision without use of bed rail, however, requested to leave HOB elevated due to abdominal pain. Provided verbal cues for performing log rolling to his R then pushing to sit up to reduce abdominal strain with mobility. Transfers: Patient performed sit to/from stand x8 with close supervision without an AD. Provided verbal cues for reaching back to sit to control descent.  Gait Training:  Patient ambulated 205 feet x2 and 80 feet x2 using no AD with close supervision. Ambulated with decreased gait speed, decreased step length and height, intermittent variable foot placement, increased B toe out and knee flexion in stance, and mild lateral sway. Provided verbal cues for increased step length and height for increased gait speed and improved balance. He went up/down 20-6" steps (2 flights in the stairwell) using L rail with close supervision-CGA with fatigue. Ascended with reciprocal gait pattern and descended with step-to gait pattern leading with R.  Neuromuscular Re-ed: Patient performed the Functional Gait Assessment, see details below. Patient scored 13/30 indicated increased risk of falls with dynamic gait activities. Patient educated on purpose of test and meaning of results. Patient stated understanding.   Educated patient on  smoking cessation, reduced alcohol consumption, fall risk/prevention, consequences of repeat TBI, and discussed home set up and d/c planning throughout session. Patient receptive to all education and asking for information programs that provide assistance with alcohol and smoking cessation upon d/c.  Patient in bed at end of session with breaks locked, bed alarm set, and all needs within reach.    Therapy Documentation Precautions:  Precautions Precautions: Fall Precaution Comments: Left chest tube removed 1/6 am Restrictions Weight Bearing Restrictions: No General: PT Amount of Missed Time (min): 20 Minutes PT Missed Treatment Reason: Pain;Patient fatigue  Balance: Balance Balance Assessed: Yes Standardized Balance Assessment Standardized Balance Assessment: Functional Gait Assessment Functional Gait  Assessment Gait Level Surface: Walks 20 ft, slow speed, abnormal gait pattern, evidence for imbalance or deviates 10-15 in outside of the 12 in walkway width. Requires more than 7 sec to ambulate 20 ft. Change in Gait Speed: Makes only minor adjustments to walking speed, or accomplishes a change in speed with significant gait deviations, deviates 10-15 in outside the 12 in walkway width, or changes speed but loses balance but is able to recover and continue walking. Gait with Horizontal Head Turns: Performs head turns smoothly with slight change in gait velocity (eg, minor disruption to smooth gait path), deviates 6-10 in outside 12 in walkway width, or uses an assistive device. Gait with Vertical Head Turns: Performs task with slight change in gait velocity (eg, minor disruption to smooth gait path), deviates 6 - 10 in outside 12 in walkway width or uses assistive device Gait and Pivot Turn: Pivot turns safely in greater than 3 sec and stops with no loss of balance, or pivot turns safely within 3 sec and stops with mild imbalance,  requires small steps to catch balance. Step Over Obstacle:  Is able to step over one shoe box (4.5 in total height) without changing gait speed. No evidence of imbalance. Gait with Narrow Base of Support: Ambulates less than 4 steps heel to toe or cannot perform without assistance. Gait with Eyes Closed: Walks 20 ft, slow speed, abnormal gait pattern, evidence for imbalance, deviates 10-15 in outside 12 in walkway width. Requires more than 9 sec to ambulate 20 ft. Ambulating Backwards: Walks 20 ft, slow speed, abnormal gait pattern, evidence for imbalance, deviates 10-15 in outside 12 in walkway width. Steps: Two feet to a stair, must use rail. Total Score: 13/30    Therapy/Group: Individual Therapy  Antonio [name] PT, DPT  07/05/2019, 4:22 PM

## 2019-07-05 NOTE — Progress Notes (Signed)
Social Work Patient ID: Antonio [name], male   DOB: 04/21/1971, 49 y.o.   MRN: [id number]  Have reviewed team conference with pt and friend, Antonio [name].  Friend confirms that pt will come to his home where he will provide the needed 24/7 supervision.  Will arrange [place] f/u.  Friend to complete fam ed on day of d/c beginning at [place], Antonio Senat, LCSW

## 2019-07-05 NOTE — Progress Notes (Signed)
Occupational Therapy Session Note  Patient Details  Name: Antonio [name] MRN: [id number] Date of Birth: 23-Aug-1970  Today's Date: 07/05/2019 OT Individual Time: 7262-0355 OT Individual Time Calculation (min): 55 min    Short Term Goals: Week 1:  OT Short Term Goal 1 (Week 1): STG=LTG d/t ELOS  Skilled Therapeutic Interventions/Progress Updates:    Pt received supine with RN present administering medication. Pt reporting pain in ribs, 7/10 agreeable to shower for pain  Relief. Discussed d/ c planning throughout session. Pt gathered items for shower with staggering gait around room, but no LOB and CGA fading to (S). Pt transferred into shower with close (S). From TTB, pt able to complete all bathing with set up assist. Pt completed UB and LB dressing with (S). Pt demonstrated improved safety awareness by sitting to don LB clothing- reinforced this. Pt took seated rest break following shower. Pt completed 300 ft of functional mobility at (S) level with 1 rest break in between. During functional mobility and rest break discussed alcohol withdrawal and hx of addiction. Discussed need for community supports and strategy of using IADLs to fill day to decreased opportunities of boredom and relapse. Pt receptive to all education and engaged in conversation. Pt returned to his room and left supine with all needs met. Bed alarm set.   Therapy Documentation Precautions:  Precautions Precautions: Fall Precaution Comments: Left chest tube removed 1/6 am Restrictions Weight Bearing Restrictions: (P) No    Therapy/Group: Individual Therapy  Curtis Sites 07/05/2019, 10:23 AM

## 2019-07-05 NOTE — Discharge Instructions (Signed)
Inpatient Rehab Discharge Instructions  Antonio [name] Discharge date and time:    Activities/Precautions/ Functional Status: Activity: no lifting, driving, or strenuous exercise for till cleared by MD Diet: regular diet Wound Care: keep wound clean and dry   Functional status:  ___ No restrictions     ___ Walk up steps independently _X__ 24/7 supervision/assistance   ___ Walk up steps with assistance ___ Intermittent supervision/assistance  ___ Bathe/dress independently ___ Walk with walker     ___ Bathe/dress with assistance ___ Walk Independently    ___ Shower independently ___ Walk with assistance    ___ Shower with assistance _X__ No alcohol     ___ Return to work/school ________  Special Instructions:    My questions have been answered and I understand these instructions. I will adhere to these goals and the provided educational materials after my discharge from the hospital.  Patient/Caregiver Signature _______________________________ Date __________  Clinician Signature _______________________________________ Date __________  Please bring this form and your medication list with you to all your follow-up doctor's appointments.

## 2019-07-06 ENCOUNTER — Inpatient Hospital Stay (HOSPITAL_COMMUNITY): Payer: Medicaid Other

## 2019-07-06 DIAGNOSIS — S069X3S Unspecified intracranial injury with loss of consciousness of 1 hour to 5 hours 59 minutes, sequela: Secondary | ICD-10-CM

## 2019-07-06 DIAGNOSIS — F10239 Alcohol dependence with withdrawal, unspecified: Secondary | ICD-10-CM

## 2019-07-06 LAB — AFP TUMOR MARKER: AFP, Serum, Tumor Marker: 8.3 ng/mL (ref 0.0–8.3)

## 2019-07-06 MED ORDER — GADOBUTROL 1 MMOL/ML IV SOLN
8.0000 mL | Freq: Once | INTRAVENOUS | Status: AC | PRN
  Administered 2019-07-06: 8 mL via INTRAVENOUS
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
## 2019-07-06 NOTE — Progress Notes (Signed)
Physical Therapy Note  Patient Details  Name: Antonio [name] MRN: [id number] Date of Birth: [birth year] Today's Date: 07/06/2019    Patient lying in bed upon PT arrival. Patient reported that he had not received breakfast and that he could not do therapy until he had eaten. RN called down to confirm breakfast was coming up to be delivered. Asked patient if he would participate in therapy in at 12pm once he had eaten. Patient in agreement. Will return at 12 pm to make up missed session.    Charm Stenner L Lorry Anastasi PT, DPT  07/06/2019, 2:23 PM
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
## 2019-07-06 NOTE — Progress Notes (Signed)
Occupational Therapy Session Note  Patient Details  Name: Antonio [name] MRN: [id number] Date of Birth: 09-May-1971  Today's Date: 07/06/2019 OT Missed Time: 82 Minutes Missed Time Reason: CT/MRI  Skilled Therapeutic Interventions/Progress Updates:    Pt at MRI for entire 60 min session. Continue OT POC.   Therapy Documentation Precautions:  Precautions Precautions: Fall Precaution Comments: Left chest tube removed 1/6 am Restrictions Weight Bearing Restrictions: No  Therapy/Group: Individual Therapy  Curtis Sites 07/06/2019, 6:54 AM

## 2019-07-06 NOTE — Progress Notes (Addendum)
Physical Therapy Note  Patient Details  Name: Antonio [name] MRN: [id number] Date of Birth: 03-10-1971 Today's Date: 07/06/2019    Patient in bed asleep upon PT arrival. Patient easily aroused and shook his head and stated "I am not doing any therapy today, I just told the nurse." PT reinforced education about participation in therapy and upcoming d/c. Asked if patient would be willing to disucss home safety with PT, patient continued to refuse, stating, "I will do everything tomorrow, just not today." Patient missed 75 min of skilled PT due to patient refusal/fatigue. Will attempt to make up missed time as able.   Ottie Neglia L Valeree Leidy PT, DPT  07/06/2019, 2:30 PM

## 2019-07-06 NOTE — Plan of Care (Signed)
  Problem: Consults Goal: RH BRAIN INJURY PATIENT EDUCATION Description: Description: See Patient Education module for eduction specifics Outcome: Progressing Goal: Skin Care Protocol Initiated - if Braden Score 18 or less Description: If consults are not indicated, leave blank or document N/A Outcome: Progressing Goal: Nutrition Consult-if indicated Outcome: Progressing Goal: Diabetes Guidelines if Diabetic/Glucose > 140 Description: If diabetic or lab glucose is > 140 mg/dl - Initiate Diabetes/Hyperglycemia Guidelines & Document Interventions  Outcome: Progressing   Problem: RH BLADDER ELIMINATION Goal: RH STG MANAGE BLADDER WITH ASSISTANCE Description: STG Manage Bladder With Mod I Assistance Outcome: Progressing   Problem: RH SKIN INTEGRITY Goal: RH STG SKIN FREE OF INFECTION/BREAKDOWN Description: Patient will verbalize signs and symptoms of infection. Outcome: Progressing   Problem: RH SAFETY Goal: RH STG ADHERE TO SAFETY PRECAUTIONS W/ASSISTANCE/DEVICE Description: STG Adhere to Safety Precautions With cues and remiders Assistance/Device. Outcome: Progressing   Problem: RH PAIN MANAGEMENT Goal: RH STG PAIN MANAGED AT OR BELOW PT'S PAIN GOAL Description: Patient will have a pain score <3 by discharge. Outcome: Progressing   Problem: RH KNOWLEDGE DEFICIT BRAIN INJURY Goal: RH STG INCREASE KNOWLEDGE OF SELF CARE AFTER BRAIN INJURY Description: Patient will manage ADLs with minimal assistance. Outcome: Progressing

## 2019-07-06 NOTE — Progress Notes (Addendum)
Physical Therapy Note  Patient Details  Name: Antonio [name] MRN: [id number] Date of Birth: 1970/12/12 Today's Date: 07/06/2019    Patient in bed upon PT arrival. Patient reported that he did eat breakfast, however, his lunch tray had just arrived and the patient stated, "I need to eat lunch, I just can't do therapy right now." Patient expressed that he was frustrated about his eating schedule and reported that he was having a bad day. Patient requested to wait until his session at 1415 to participate in therapy. PT educated on the importance of participating in therapy and d/c approaching at the end of the week. Patient stated understand and continued to refuse working with therapy at this time. Patient missed 60 min of skilled PT due to refusal. Will attempt to make up missed time as able.    Dino Borntreger L Momin Misko PT, DPT  07/06/2019, 2:26 PM

## 2019-07-07 ENCOUNTER — Inpatient Hospital Stay (HOSPITAL_COMMUNITY): Payer: Medicaid Other

## 2019-07-07 ENCOUNTER — Inpatient Hospital Stay (HOSPITAL_COMMUNITY): Payer: Medicaid Other | Admitting: Occupational Therapy

## 2019-07-07 LAB — RETICULOCYTES
Immature Retic Fract: 28.9 % — ABNORMAL HIGH (ref 2.3–15.9)
RBC.: 3.57 MIL/uL — ABNORMAL LOW (ref 4.22–5.81)
Retic Count, Absolute: 156 10*3/uL (ref 19.0–186.0)
Retic Ct Pct: 4.4 % — ABNORMAL HIGH (ref 0.4–3.1)

## 2019-07-07 LAB — IRON AND TIBC
Iron: 37 ug/dL — ABNORMAL LOW (ref 45–182)
Saturation Ratios: 12 % — ABNORMAL LOW (ref 17.9–39.5)
TIBC: 305 ug/dL (ref 250–450)
UIBC: 268 ug/dL

## 2019-07-07 LAB — FERRITIN: Ferritin: 167 ng/mL (ref 24–336)

## 2019-07-07 LAB — VITAMIN B12: Vitamin B-12: 649 pg/mL (ref 180–914)

## 2019-07-07 LAB — FOLATE: Folate: 20.9 ng/mL (ref >5.9)

## 2019-07-07 MED ORDER — OXYCODONE HCL 5 MG PO TABS
5.0000 mg | ORAL_TABLET | Freq: Four times a day (QID) | ORAL | Status: DC | PRN
  Administered 2019-07-07 – 2019-07-08 (×4): 5 mg via ORAL
  Filled 2019-07-07 (×4): qty 1
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
## 2019-07-07 NOTE — Progress Notes (Signed)
Physical Therapy Session Note  Patient Details  Name: Antonio [name] MRN: [id number] Date of Birth: 05-Jun-1971  Today's Date: 07/07/2019 PT Individual Time: [id number]) PT Individual Time Calculation (min): 45 min   Short Term Goals: Week 1:  PT Short Term Goal 1 (Week 1): =LTGs due to ELOS Week 2:    Week 3:     Skilled Therapeutic Interventions/Progress Updates:  Pain:  Ribs , when questioned pt perseverates on pain and states 10/10 but in no apparent distress during session.  States "oxy doesn't help one bit. It is like tylenol".  Treatment to tolerance.  Pt initially resting supine and agreeable to session.  Supine to sit w/supervision, but hob elevated due to pt declines flat bed due to rib pain.   Gait 70ft w/cga, turn 180 and returned to room bc pt stated he needed his wc today.  Reports "I passed out in the bathroom this am", but unable to confirm w/nursing, possibly confabulation.  Pt transported to gym via wc.  STS w/supervision, gait 2ft to car, turn transfer into and out of car w/superivsion only.    Car transfer - performed w/supervision   Ramp - performed w/cga ascends/ min assist descending w/single episode of mild balance loss.  Performs w/supervision using rail.  Gait 37ft on uneven surfaces repeated x 2 first pass w/rail and supervision, second pass without rail and min assist for balance.  Curb ascends/descends w/min assist, repeated w/use of single rail w/supervision.  Picking object off of floor - pt sts from wc, retrieves object from floor, returns to sitting w/cga.  Stairs 12 steps w/single rail step over step ascend/step to descends supervision to ascend/cga to descend.  Gait 269ft w/close supervision to room including two turns, transfers to bed w/supervision.  Declines further rx.  Sit to supine w/supervision and hob elevated/refuses to attempt w/o elevation due to rib pain.    Pt states he has frequent episodes of dizzyness w/transitional mvmnts.   Primary providing HEP to address.  Also discussed possible contribution of mild dehydration and encouraged fluid intake.  Pt left supine w/rails up x 3, alarm set, bed in lowest position, and needs in reach.   Therapy Documentation Precautions:  Precautions Precautions: Fall Precaution Comments: Left chest tube removed 1/6 am Restrictions Weight Bearing Restrictions: No   Therapy/Group: Individual Therapy  Callie Fielding, [place] 07/07/2019, 4:44 PM
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
## 2019-07-08 ENCOUNTER — Ambulatory Visit (HOSPITAL_COMMUNITY): Payer: Medicaid Other

## 2019-07-08 ENCOUNTER — Encounter (HOSPITAL_COMMUNITY): Payer: Medicaid Other | Admitting: Occupational Therapy

## 2019-07-08 ENCOUNTER — Inpatient Hospital Stay: Payer: Self-pay | Admitting: Nurse Practitioner

## 2019-07-08 MED ORDER — METHOCARBAMOL 500 MG PO TABS: 500.0000 mg | ORAL_TABLET | Freq: Three times a day (TID) | ORAL | 1 refills | Status: DC

## 2019-07-08 MED ORDER — OXYCODONE HCL 5 MG PO TABS: 5.0000 mg | ORAL_TABLET | Freq: Two times a day (BID) | ORAL | 0 refills | Status: DC | PRN

## 2019-07-08 MED ORDER — LACTULOSE 10 GM/15ML PO SOLN: 10.0000 g | Freq: Two times a day (BID) | ORAL | 0 refills | Status: DC

## 2019-07-08 MED ORDER — FOLIC ACID 1 MG PO TABS: 1.0000 mg | ORAL_TABLET | Freq: Every day | ORAL | 1 refills | Status: DC

## 2019-07-08 MED ORDER — LIDOCAINE 5 % EX PTCH: 3.0000 | MEDICATED_PATCH | CUTANEOUS | 0 refills | Status: DC

## 2019-07-08 MED ORDER — TRAMADOL HCL 50 MG PO TABS: 50.0000 mg | ORAL_TABLET | Freq: Four times a day (QID) | ORAL | 0 refills | Status: DC | PRN

## 2019-07-08 MED ORDER — THIAMINE HCL 100 MG PO TABS: 100.0000 mg | ORAL_TABLET | Freq: Every day | ORAL | 2 refills | Status: DC

## 2019-07-08 MED ORDER — GABAPENTIN 300 MG PO CAPS: 300.0000 mg | ORAL_CAPSULE | Freq: Three times a day (TID) | ORAL | 1 refills | Status: DC
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
## 2019-07-08 NOTE — Progress Notes (Signed)
Physical Therapy Session Note  Patient Details  Name: Antonio [name] MRN: [id number] Date of Birth: 01/02/1971  Today's Date: 07/08/2019 PT Individual Time: 1100-1115 PT Individual Time Calculation (min): 15 min   Short Term Goals: Week 1:  PT Short Term Goal 1 (Week 1): =LTGs due to ELOS  Skilled Therapeutic Interventions/Progress Updates:     Patient in room with his friend, Antonio [name], in the room. Patient and his friend participated in Buyer, retail education. Educated on having supervision assist when walking initially at home and CGA-min A with walking outdoors, patient agreed. Caregiver reports that he is disabled and will not be able to walk with him outside, patient stated that he has other friends that can do this with him. Reviewed HEP handout and fall risk/prevetion. Patient able to teach back calling for emergency services in the event of a fall. Patient asked for a rollator when walking outside. PT provided several names of second hand stores where this could be obtained, as patient is uninsured. Patient appreciated education and information. Patient and caregiver had no further questions and were eager to leave. Pam, PA and RN notified. Patient in bed with breaks locked and bed alarm set with his friend in the room at end of session.   Therapy Documentation Precautions:  Precautions Precautions: Fall Precaution Comments: Left chest tube removed 1/6 am Restrictions Weight Bearing Restrictions: No    Therapy/Group: Individual Therapy  Zoella Roberti L Yuleni Burich PT, DPT  07/08/2019, 11:20 AM
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
## 2019-07-08 NOTE — Plan of Care (Signed)
  Problem: Consults Goal: RH BRAIN INJURY PATIENT EDUCATION Description: Description: See Patient Education module for eduction specifics Outcome: Completed/Met Goal: Skin Care Protocol Initiated - if Braden Score 18 or less Description: If consults are not indicated, leave blank or document N/A Outcome: Completed/Met Goal: Nutrition Consult-if indicated Outcome: Completed/Met Goal: Diabetes Guidelines if Diabetic/Glucose > 140 Description: If diabetic or lab glucose is > 140 mg/dl - Initiate Diabetes/Hyperglycemia Guidelines & Document Interventions  Outcome: Completed/Met   Problem: RH BLADDER ELIMINATION Goal: RH STG MANAGE BLADDER WITH ASSISTANCE Description: STG Manage Bladder With Mod I Assistance Outcome: Completed/Met   Problem: RH SKIN INTEGRITY Goal: RH STG SKIN FREE OF INFECTION/BREAKDOWN Description: Patient will verbalize signs and symptoms of infection. Outcome: Completed/Met   Problem: RH SAFETY Goal: RH STG ADHERE TO SAFETY PRECAUTIONS W/ASSISTANCE/DEVICE Description: STG Adhere to Safety Precautions With cues and remiders Assistance/Device. Outcome: Completed/Met   Problem: RH PAIN MANAGEMENT Goal: RH STG PAIN MANAGED AT OR BELOW PT'S PAIN GOAL Description: Patient will have a pain score <3 by discharge. Outcome: Completed/Met   Problem: RH KNOWLEDGE DEFICIT BRAIN INJURY Goal: RH STG INCREASE KNOWLEDGE OF SELF CARE AFTER BRAIN INJURY Description: Patient will manage ADLs with minimal assistance. Outcome: Completed/Met

## 2019-07-08 NOTE — Progress Notes (Signed)
Pt discharged to home with family. Discharge instructions given to pt and caregiver with verbal understanding.

## 2019-07-09 NOTE — Progress Notes (Signed)
Social Work Discharge Note   The overall goal for the admission was met for:   Discharge location: Yes - home with friend, [name]   Length of Stay: Yes - 8 days  Discharge activity level: Yes - supervision  Home/community participation: Yes  Services provided included: MD, RD, PT, OT, SLP, RN, Pharmacy, Neuropsych and SW  Financial Services: Medicaid  (family planning coverage only)  Follow-up services arranged: No agency can staff due to charity care case slots for this week all taken and pt aware.  Comments (or additional information):  [name] @ [phone]  Patient/Family verbalized understanding of follow-up arrangements: aware cannot staff any HH follow up  Individual responsible for coordination of the follow-up plan: pt  Confirmed correct DME delivered:  NA    Antonio [name]

## 2019-07-25 ENCOUNTER — Inpatient Hospital Stay: Payer: Self-pay | Admitting: Nurse Practitioner

## 2019-08-02 ENCOUNTER — Ambulatory Visit: Payer: Self-pay | Admitting: Gastroenterology

## 2019-08-29 ENCOUNTER — Other Ambulatory Visit: Payer: Self-pay

## 2019-08-29 ENCOUNTER — Emergency Department (HOSPITAL_COMMUNITY): Payer: Self-pay

## 2019-08-29 ENCOUNTER — Emergency Department (HOSPITAL_COMMUNITY)
Admission: EM | Admit: 2019-08-29 | Discharge: 2019-08-30 | Disposition: A | Discharge status: Home / Self Care | Payer: Self-pay | Attending: Emergency Medicine | Admitting: Emergency Medicine

## 2019-08-29 ENCOUNTER — Encounter (HOSPITAL_COMMUNITY): Payer: Self-pay

## 2019-08-29 DIAGNOSIS — F11188 Opioid abuse with other opioid-induced disorder: Secondary | ICD-10-CM | POA: Insufficient documentation

## 2019-08-29 DIAGNOSIS — F19939 Other psychoactive substance use, unspecified with withdrawal, unspecified: Secondary | ICD-10-CM | POA: Insufficient documentation

## 2019-08-29 DIAGNOSIS — F329 Major depressive disorder, single episode, unspecified: Secondary | ICD-10-CM | POA: Insufficient documentation

## 2019-08-29 DIAGNOSIS — F1910 Other psychoactive substance abuse, uncomplicated: Secondary | ICD-10-CM

## 2019-08-29 DIAGNOSIS — F17210 Nicotine dependence, cigarettes, uncomplicated: Secondary | ICD-10-CM | POA: Insufficient documentation

## 2019-08-29 LAB — CBC WITH DIFFERENTIAL/PLATELET
Abs Immature Granulocytes: 0.06 10*3/uL (ref 0.00–0.07)
Basophils Absolute: 0.1 10*3/uL (ref 0.0–0.1)
Basophils Relative: 1 %
Eosinophils Absolute: 0.4 10*3/uL (ref 0.0–0.5)
Eosinophils Relative: 5 %
HCT: 37.7 % — ABNORMAL LOW (ref 39.0–52.0)
Hemoglobin: 12.8 g/dL — ABNORMAL LOW (ref 13.0–17.0)
Immature Granulocytes: 1 %
Lymphocytes Relative: 30 %
Lymphs Abs: 2.3 10*3/uL (ref 0.7–4.0)
MCH: 28.8 pg (ref 26.0–34.0)
MCHC: 34 g/dL (ref 30.0–36.0)
MCV: 84.7 fL (ref 80.0–100.0)
Monocytes Absolute: 1.9 10*3/uL — ABNORMAL HIGH (ref 0.1–1.0)
Monocytes Relative: 25 %
Neutro Abs: 2.9 10*3/uL (ref 1.7–7.7)
Neutrophils Relative %: 38 %
Platelets: 150 10*3/uL (ref 150–400)
RBC: 4.45 MIL/uL (ref 4.22–5.81)
RDW: 17.5 % — ABNORMAL HIGH (ref 11.5–15.5)
WBC: 7.6 10*3/uL (ref 4.0–10.5)
nRBC: 0 % (ref 0.0–0.2)

## 2019-08-29 LAB — URINALYSIS, ROUTINE W REFLEX MICROSCOPIC
Bilirubin Urine: NEGATIVE
Glucose, UA: NEGATIVE mg/dL
Hgb urine dipstick: NEGATIVE
Ketones, ur: NEGATIVE mg/dL
Leukocytes,Ua: NEGATIVE
Nitrite: NEGATIVE
Protein, ur: NEGATIVE mg/dL
Specific Gravity, Urine: 1.01 (ref 1.005–1.030)
pH: 8 (ref 5.0–8.0)

## 2019-08-29 LAB — COMPREHENSIVE METABOLIC PANEL
ALT: 64 U/L — ABNORMAL HIGH (ref 0–44)
AST: 151 U/L — ABNORMAL HIGH (ref 15–41)
Albumin: 2.4 g/dL — ABNORMAL LOW (ref 3.5–5.0)
Alkaline Phosphatase: 309 U/L — ABNORMAL HIGH (ref 38–126)
Anion gap: 6 (ref 5–15)
BUN: 6 mg/dL (ref 6–20)
CO2: 24 mmol/L (ref 22–32)
Calcium: 8.2 mg/dL — ABNORMAL LOW (ref 8.9–10.3)
Chloride: 108 mmol/L (ref 98–111)
Creatinine, Ser: 0.72 mg/dL (ref 0.61–1.24)
GFR calc Af Amer: >60 mL/min (ref >60)
GFR calc non Af Amer: >60 mL/min (ref >60)
Glucose, Bld: 95 mg/dL (ref 70–99)
Potassium: 4.1 mmol/L (ref 3.5–5.1)
Sodium: 138 mmol/L (ref 135–145)
Total Bilirubin: 1.5 mg/dL — ABNORMAL HIGH (ref 0.3–1.2)
Total Protein: 7.5 g/dL (ref 6.5–8.1)

## 2019-08-29 LAB — RAPID URINE DRUG SCREEN, HOSP PERFORMED
Amphetamines: NOT DETECTED
Barbiturates: NOT DETECTED
Benzodiazepines: POSITIVE — AB
Cocaine: POSITIVE — AB
Opiates: NOT DETECTED
Tetrahydrocannabinol: NOT DETECTED

## 2019-08-29 LAB — ETHANOL: Alcohol, Ethyl (B): <10 mg/dL (ref <10)

## 2019-08-29 LAB — MAGNESIUM: Magnesium: 1.8 mg/dL (ref 1.7–2.4)

## 2019-08-29 MED ORDER — LORAZEPAM 2 MG/ML IJ SOLN
0.0000 mg | Freq: Four times a day (QID) | INTRAMUSCULAR | Status: DC
  Administered 2019-08-29: 1 mg via INTRAVENOUS
  Filled 2019-08-29: qty 1

## 2019-08-29 MED ORDER — LORAZEPAM 2 MG/ML IJ SOLN: 0.0000 mg | Freq: Two times a day (BID) | INTRAMUSCULAR | Status: DC

## 2019-08-29 MED ORDER — THIAMINE HCL 100 MG/ML IJ SOLN
100.0000 mg | Freq: Every day | INTRAMUSCULAR | Status: DC
  Administered 2019-08-29: 100 mg via INTRAVENOUS
  Filled 2019-08-29: qty 2

## 2019-08-29 MED ORDER — LORAZEPAM 2 MG/ML IJ SOLN
1.0000 mg | Freq: Once | INTRAMUSCULAR | Status: AC
  Administered 2019-08-29: 1 mg via INTRAVENOUS
  Filled 2019-08-29: qty 1

## 2019-08-29 MED ORDER — LORAZEPAM 1 MG PO TABS: 0.0000 mg | ORAL_TABLET | Freq: Four times a day (QID) | ORAL | Status: DC

## 2019-08-29 MED ORDER — DIPHENHYDRAMINE HCL 50 MG/ML IJ SOLN
25.0000 mg | Freq: Once | INTRAMUSCULAR | Status: AC
  Administered 2019-08-29: 12:00:00 25 mg via INTRAVENOUS
  Filled 2019-08-29: qty 1

## 2019-08-29 MED ORDER — LORAZEPAM 2 MG/ML IJ SOLN
1.0000 mg | Freq: Once | INTRAMUSCULAR | Status: AC
  Administered 2019-08-29: 1 mg via INTRAVENOUS

## 2019-08-29 MED ORDER — LORAZEPAM 1 MG PO TABS: 0.0000 mg | ORAL_TABLET | Freq: Two times a day (BID) | ORAL | Status: DC

## 2019-08-29 MED ORDER — THIAMINE HCL 100 MG PO TABS: 100.0000 mg | ORAL_TABLET | Freq: Every day | ORAL | Status: DC

## 2019-08-29 NOTE — ED Triage Notes (Addendum)
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 Pt last drink was yesterday and last heroin use was 2 days ago. Pt went to suboxone clinic this morning around 930am. Pt having uncontrolled muscle movements. Pt given 2.5mg  versed en route. 20G right hand. Pt arrives alert, thrashing around in the bed
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
## 2019-08-29 NOTE — ED Notes (Signed)
Attempted to ambulate patient, pt refused.

## 2019-08-29 NOTE — Discharge Instructions (Addendum)
You likely had a reaction to the medication or drugs you have been using. Please follow-up for help with your substance abuse problems.

## 2019-08-30 NOTE — ED Notes (Signed)
RN attempted to call pt's sponsor Jannett x2, no answer.

## 2019-08-30 NOTE — ED Notes (Signed)
Pt called a friend to come pick him up. Pt verbalized understanding of discharge instructions. Follow up care reviewed, pt brought to lobby via wheelchair to wait for ride.

## 2019-11-30 ENCOUNTER — Encounter (HOSPITAL_BASED_OUTPATIENT_CLINIC_OR_DEPARTMENT_OTHER): Payer: Self-pay

## 2019-11-30 ENCOUNTER — Emergency Department (HOSPITAL_BASED_OUTPATIENT_CLINIC_OR_DEPARTMENT_OTHER)
Admission: EM | Admit: 2019-11-30 | Discharge: 2019-11-30 | Disposition: A | Discharge status: Home / Self Care | Payer: Medicaid Other | Attending: Emergency Medicine | Admitting: Emergency Medicine

## 2019-11-30 ENCOUNTER — Other Ambulatory Visit: Payer: Self-pay

## 2019-11-30 DIAGNOSIS — J45909 Unspecified asthma, uncomplicated: Secondary | ICD-10-CM | POA: Insufficient documentation

## 2019-11-30 DIAGNOSIS — Z87891 Personal history of nicotine dependence: Secondary | ICD-10-CM | POA: Insufficient documentation

## 2019-11-30 DIAGNOSIS — K0889 Other specified disorders of teeth and supporting structures: Secondary | ICD-10-CM | POA: Insufficient documentation

## 2019-11-30 DIAGNOSIS — Z79899 Other long term (current) drug therapy: Secondary | ICD-10-CM | POA: Insufficient documentation

## 2019-11-30 DIAGNOSIS — Z91040 Latex allergy status: Secondary | ICD-10-CM | POA: Insufficient documentation

## 2019-11-30 HISTORY — DX: Alcohol abuse, in remission: F10.11

## 2019-11-30 MED ORDER — CLINDAMYCIN HCL 150 MG PO CAPS: 300.0000 mg | ORAL_CAPSULE | Freq: Three times a day (TID) | ORAL | 0 refills | Status: AC
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
## 2019-11-30 NOTE — Discharge Instructions (Addendum)
Per our discussion, I am prescribing you an antibiotic called clindamycin.  You are going to take this 3 times a day for the next 7 days.  Please do not stop taking this early.  Please continue to take Tylenol and ibuprofen for management of your pain.  Please follow the instructions on the bottle.  Please reach out to dental providers in your area.  Please not hesitate to return to the emergency department any new or worsening symptoms.  It was a pleasure to meet you.

## 2019-11-30 NOTE — ED Notes (Signed)
ED Provider at bedside. 

## 2019-11-30 NOTE — ED Triage Notes (Signed)
Pt c/o right side toothache x 1 year-woke this am with swelling to right side of face-NAD-steady gait

## 2020-03-02 ENCOUNTER — Ambulatory Visit: Payer: Medicaid Other | Admitting: Internal Medicine

## 2020-06-01 IMAGING — DX RIGHT ANKLE - 2 VIEW
2 series · 2 of 2 positions shown · non-contrast
Comparison: Plain film from earlier same day.

CLINICAL DATA: Post reduction

EXAM:
RIGHT ANKLE - 2 VIEW

[ankle ap]
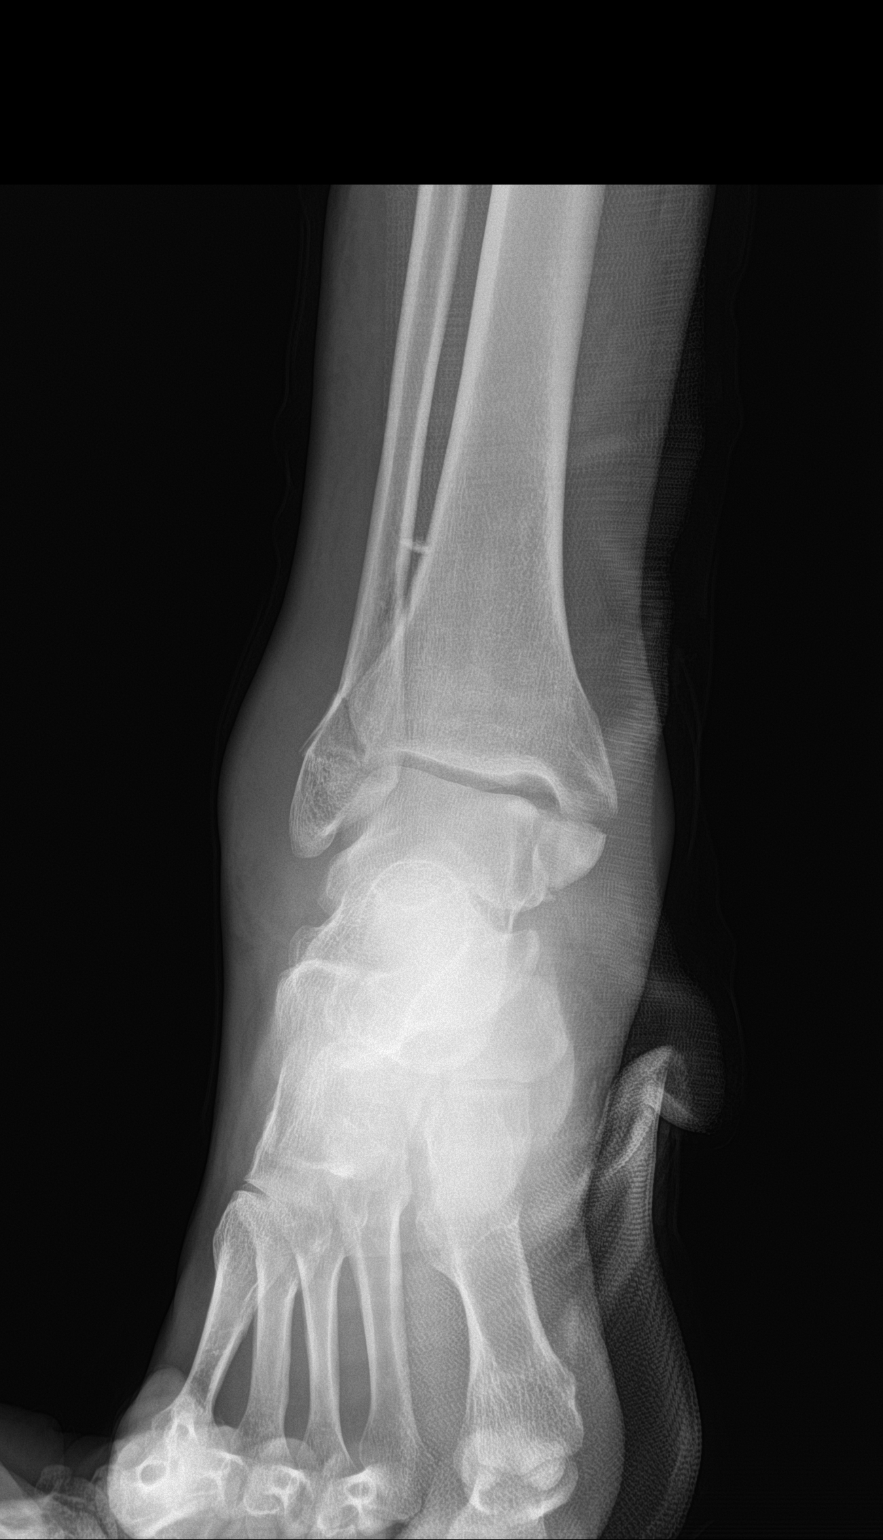

[ankle lat]
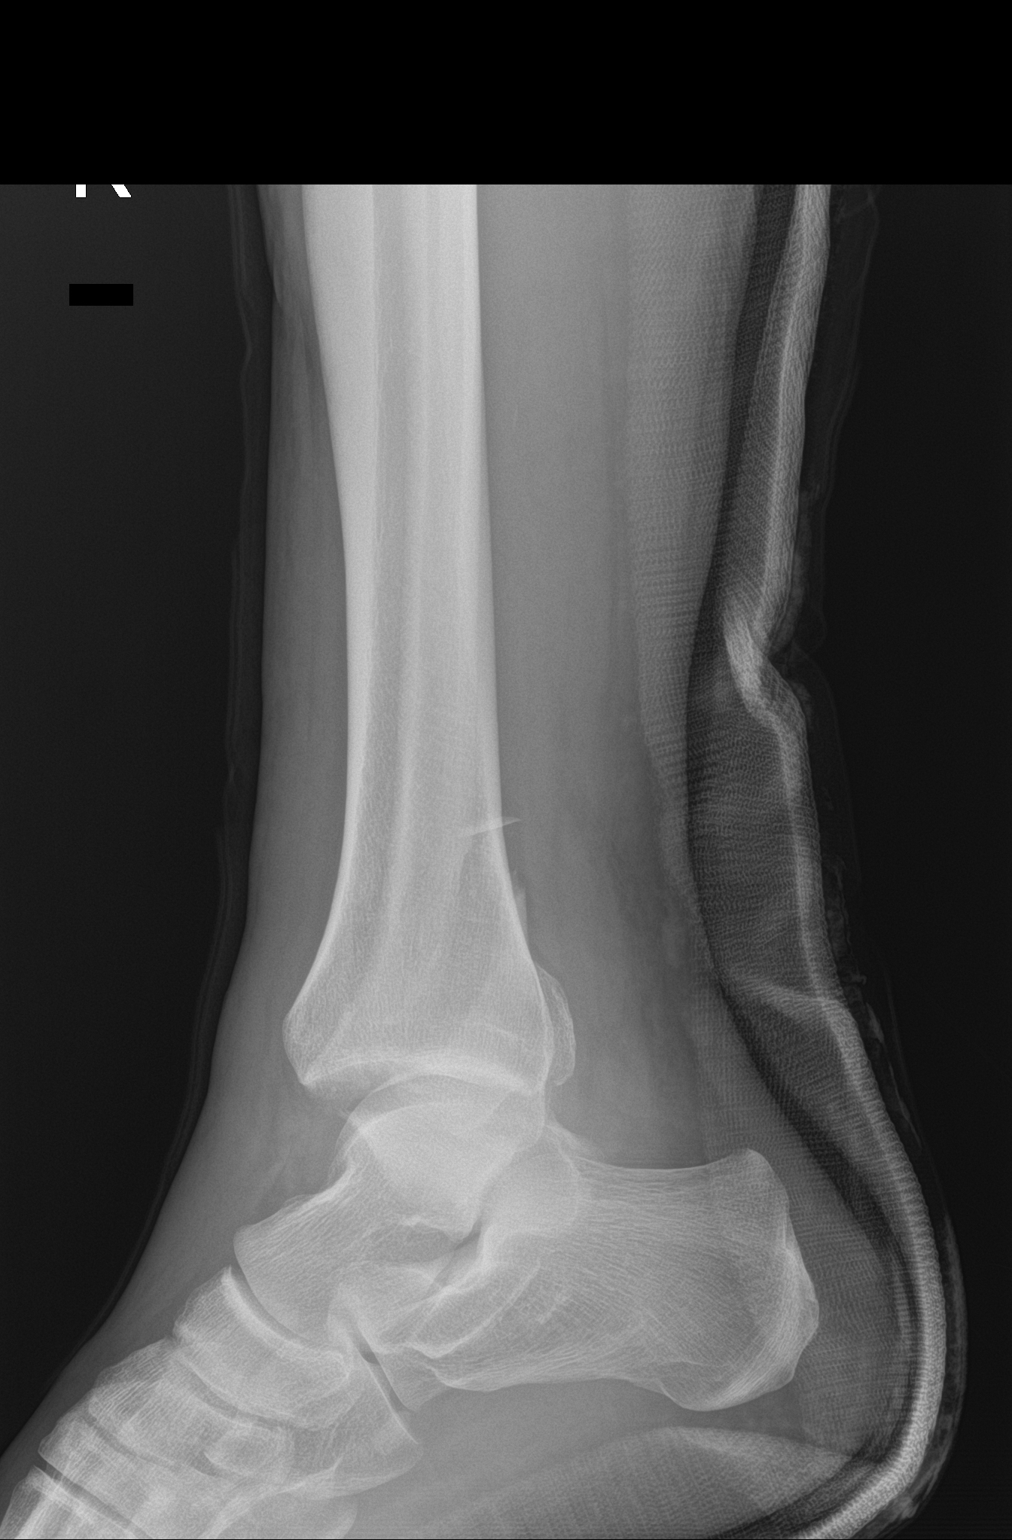

[2 of 2 positions shown; findings below may reference images not displayed]

FINDINGS: Improved alignment status post reduction. Ankle mortise is now
symmetric. Overlying casting in place.
IMPRESSION: Improved alignment status post reduction and casting.

## 2020-09-09 IMAGING — CR LEFT FEMUR 2 VIEWS
4 series · 4 of 4 positions shown · non-contrast
Comparison: None.

CLINICAL DATA: Ecchymosis and pain in the medial left thigh since
this morning.

EXAM:
LEFT FEMUR 2 VIEWS

[femur ap (1 of 2)]
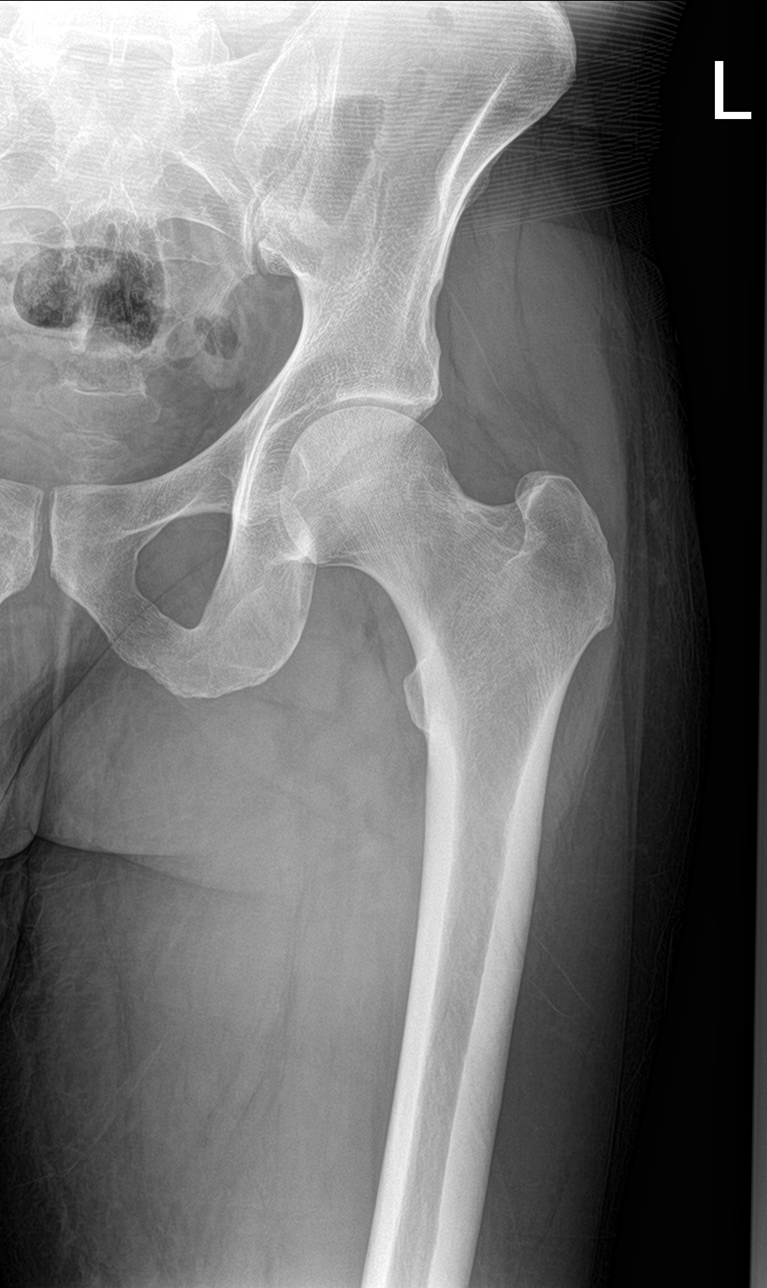

[femur ap (2 of 2)]
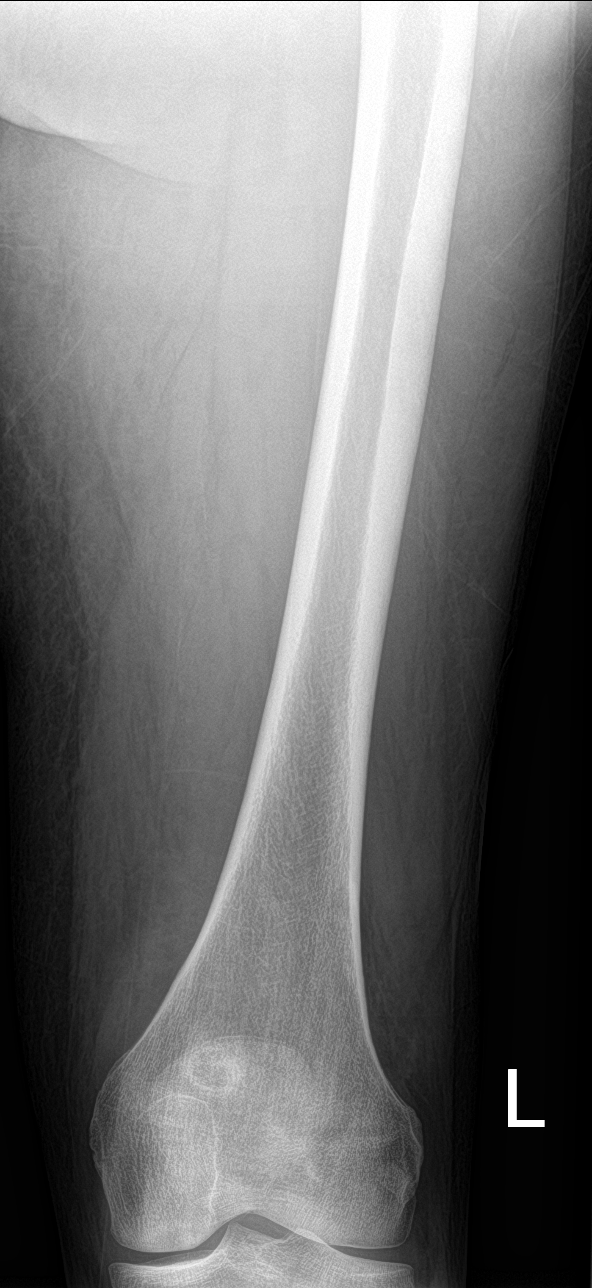

[femur lat (1 of 2)]
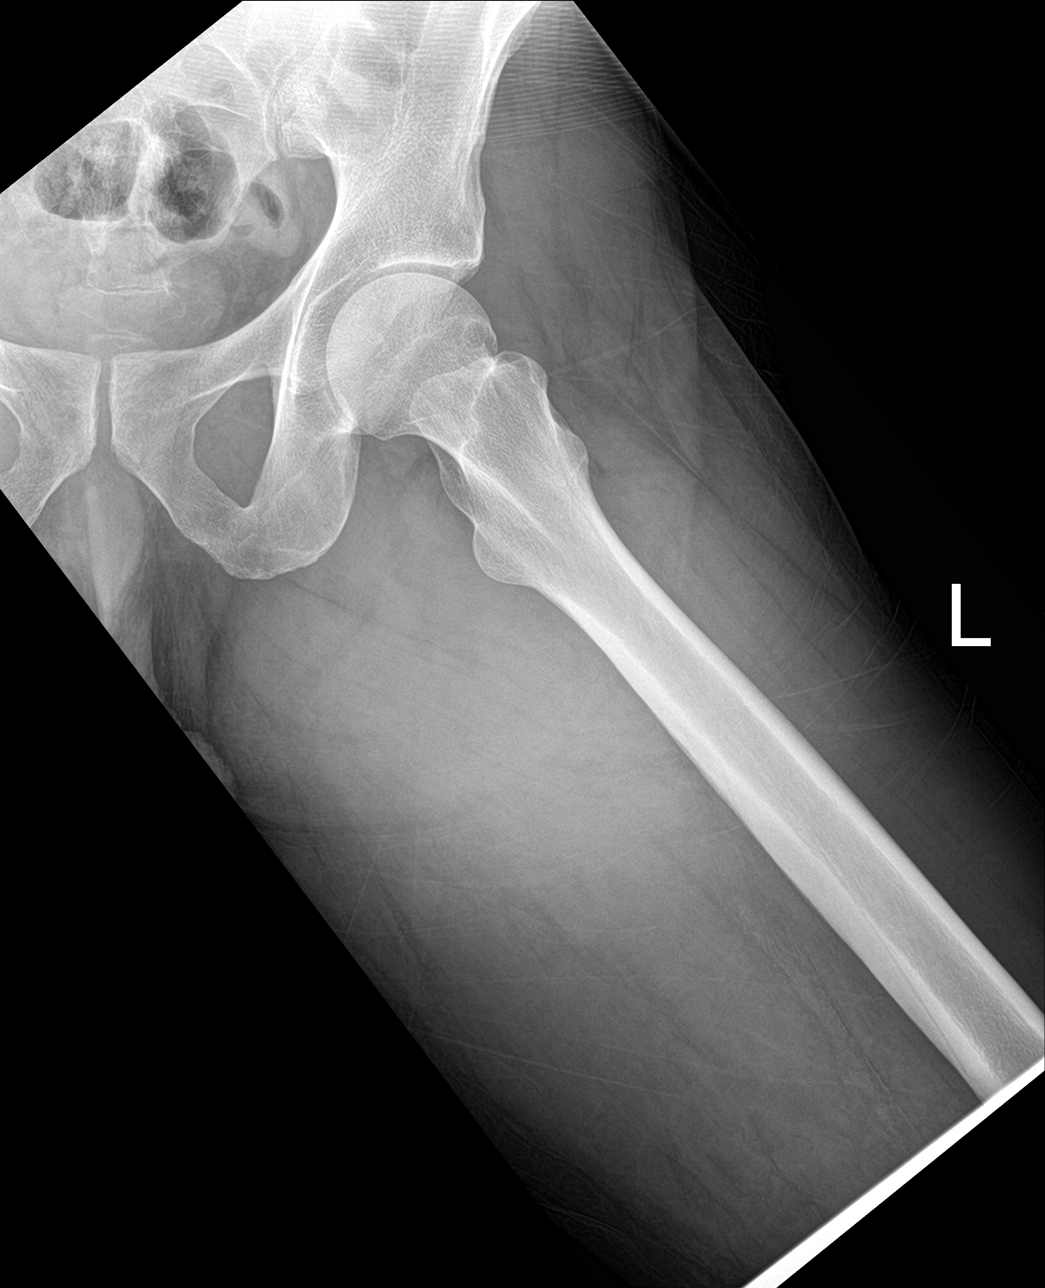

[femur lat (2 of 2)]
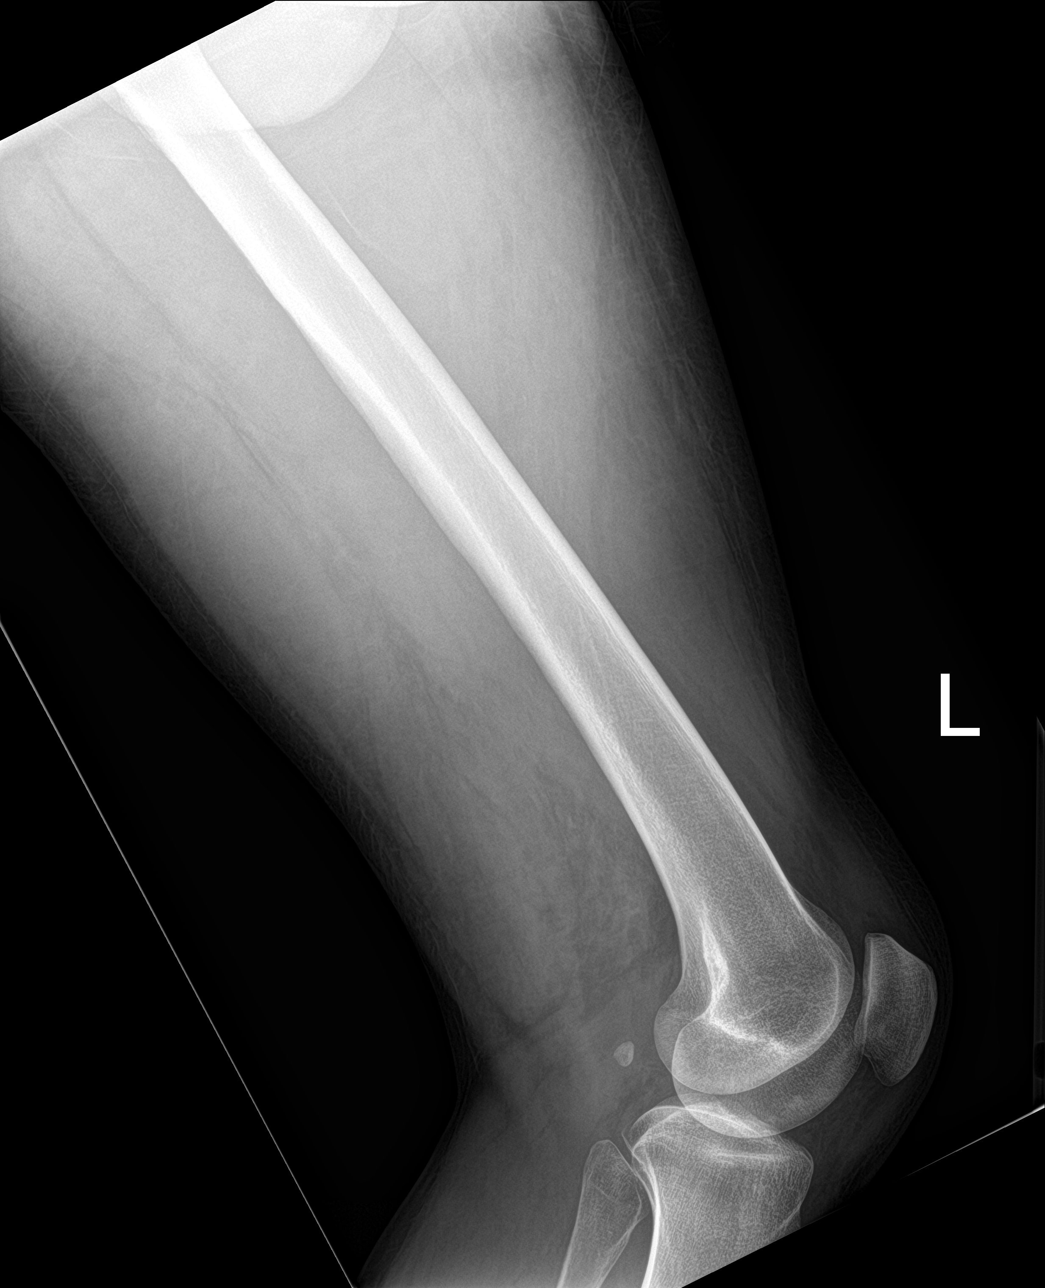

[4 of 4 positions shown; findings below may reference images not displayed]

FINDINGS: There is no evidence of fracture or other focal bone lesions. Soft
tissues are unremarkable.
IMPRESSION: Negative.

## 2021-07-23 ENCOUNTER — Emergency Department (HOSPITAL_COMMUNITY): Payer: Self-pay

## 2021-07-23 ENCOUNTER — Other Ambulatory Visit: Payer: Self-pay

## 2021-07-23 ENCOUNTER — Inpatient Hospital Stay (HOSPITAL_COMMUNITY)
Admission: EM | Admit: 2021-07-23 | Discharge: 2021-07-31 | DRG: 432 | Disposition: A | Discharge status: Hospice | Payer: Self-pay | Attending: Internal Medicine | Admitting: Internal Medicine

## 2021-07-23 DIAGNOSIS — Z20822 Contact with and (suspected) exposure to covid-19: Secondary | ICD-10-CM | POA: Diagnosis present

## 2021-07-23 DIAGNOSIS — Z515 Encounter for palliative care: Secondary | ICD-10-CM

## 2021-07-23 DIAGNOSIS — R188 Other ascites: Secondary | ICD-10-CM

## 2021-07-23 DIAGNOSIS — Z9081 Acquired absence of spleen: Secondary | ICD-10-CM

## 2021-07-23 DIAGNOSIS — F1010 Alcohol abuse, uncomplicated: Secondary | ICD-10-CM | POA: Diagnosis present

## 2021-07-23 DIAGNOSIS — K709 Alcoholic liver disease, unspecified: Secondary | ICD-10-CM

## 2021-07-23 DIAGNOSIS — Z79899 Other long term (current) drug therapy: Secondary | ICD-10-CM

## 2021-07-23 DIAGNOSIS — F32A Depression, unspecified: Secondary | ICD-10-CM | POA: Diagnosis present

## 2021-07-23 DIAGNOSIS — K7031 Alcoholic cirrhosis of liver with ascites: Principal | ICD-10-CM | POA: Diagnosis present

## 2021-07-23 DIAGNOSIS — Z23 Encounter for immunization: Secondary | ICD-10-CM

## 2021-07-23 DIAGNOSIS — Z882 Allergy status to sulfonamides status: Secondary | ICD-10-CM

## 2021-07-23 DIAGNOSIS — J45909 Unspecified asthma, uncomplicated: Secondary | ICD-10-CM | POA: Diagnosis present

## 2021-07-23 DIAGNOSIS — Z91030 Bee allergy status: Secondary | ICD-10-CM

## 2021-07-23 DIAGNOSIS — B1910 Unspecified viral hepatitis B without hepatic coma: Secondary | ICD-10-CM | POA: Diagnosis present

## 2021-07-23 DIAGNOSIS — R160 Hepatomegaly, not elsewhere classified: Secondary | ICD-10-CM

## 2021-07-23 DIAGNOSIS — K7460 Unspecified cirrhosis of liver: Secondary | ICD-10-CM | POA: Diagnosis present

## 2021-07-23 DIAGNOSIS — I82220 Acute embolism and thrombosis of inferior vena cava: Secondary | ICD-10-CM | POA: Diagnosis present

## 2021-07-23 DIAGNOSIS — Z91040 Latex allergy status: Secondary | ICD-10-CM

## 2021-07-23 DIAGNOSIS — F17210 Nicotine dependence, cigarettes, uncomplicated: Secondary | ICD-10-CM | POA: Diagnosis present

## 2021-07-23 DIAGNOSIS — I8510 Secondary esophageal varices without bleeding: Secondary | ICD-10-CM | POA: Diagnosis present

## 2021-07-23 DIAGNOSIS — Z66 Do not resuscitate: Secondary | ICD-10-CM | POA: Diagnosis present

## 2021-07-23 DIAGNOSIS — K7290 Hepatic failure, unspecified without coma: Secondary | ICD-10-CM | POA: Diagnosis present

## 2021-07-23 DIAGNOSIS — K5900 Constipation, unspecified: Secondary | ICD-10-CM | POA: Diagnosis present

## 2021-07-23 DIAGNOSIS — Z811 Family history of alcohol abuse and dependence: Secondary | ICD-10-CM

## 2021-07-23 DIAGNOSIS — B1920 Unspecified viral hepatitis C without hepatic coma: Secondary | ICD-10-CM | POA: Diagnosis present

## 2021-07-23 DIAGNOSIS — C220 Liver cell carcinoma: Secondary | ICD-10-CM | POA: Diagnosis present

## 2021-07-23 LAB — URINALYSIS, ROUTINE W REFLEX MICROSCOPIC
Glucose, UA: 100 mg/dL — AB
Hgb urine dipstick: NEGATIVE
Ketones, ur: NEGATIVE mg/dL
Leukocytes,Ua: NEGATIVE
Nitrite: NEGATIVE
Protein, ur: NEGATIVE mg/dL
Specific Gravity, Urine: 1.025 (ref 1.005–1.030)
pH: 6 (ref 5.0–8.0)

## 2021-07-23 LAB — COMPREHENSIVE METABOLIC PANEL
ALT: 58 U/L — ABNORMAL HIGH (ref 0–44)
AST: 192 U/L — ABNORMAL HIGH (ref 15–41)
Albumin: 2 g/dL — ABNORMAL LOW (ref 3.5–5.0)
Alkaline Phosphatase: 178 U/L — ABNORMAL HIGH (ref 38–126)
Anion gap: 6 (ref 5–15)
BUN: 6 mg/dL (ref 6–20)
CO2: 22 mmol/L (ref 22–32)
Calcium: 8.7 mg/dL — ABNORMAL LOW (ref 8.9–10.3)
Chloride: 107 mmol/L (ref 98–111)
Creatinine, Ser: 0.51 mg/dL — ABNORMAL LOW (ref 0.61–1.24)
GFR, Estimated: >60 mL/min (ref >60)
Glucose, Bld: 110 mg/dL — ABNORMAL HIGH (ref 70–99)
Potassium: 3.9 mmol/L (ref 3.5–5.1)
Sodium: 135 mmol/L (ref 135–145)
Total Bilirubin: 3.6 mg/dL — ABNORMAL HIGH (ref 0.3–1.2)
Total Protein: 7.6 g/dL (ref 6.5–8.1)

## 2021-07-23 LAB — PROTIME-INR
INR: 1.6 — ABNORMAL HIGH (ref 0.8–1.2)
Prothrombin Time: 18.6 seconds — ABNORMAL HIGH (ref 11.4–15.2)

## 2021-07-23 LAB — CBC
HCT: 35.2 % — ABNORMAL LOW (ref 39.0–52.0)
Hemoglobin: 13 g/dL (ref 13.0–17.0)
MCH: 32 pg (ref 26.0–34.0)
MCHC: 36.9 g/dL — ABNORMAL HIGH (ref 30.0–36.0)
MCV: 86.7 fL (ref 80.0–100.0)
Platelets: 105 10*3/uL — ABNORMAL LOW (ref 150–400)
RBC: 4.06 MIL/uL — ABNORMAL LOW (ref 4.22–5.81)
RDW: 19.4 % — ABNORMAL HIGH (ref 11.5–15.5)
WBC: 8.1 10*3/uL (ref 4.0–10.5)
nRBC: 0 % (ref 0.0–0.2)

## 2021-07-23 LAB — RESP PANEL BY RT-PCR (FLU A&B, COVID) ARPGX2
Influenza A by PCR: NEGATIVE
Influenza B by PCR: NEGATIVE
SARS Coronavirus 2 by RT PCR: NEGATIVE

## 2021-07-23 LAB — LIPASE, BLOOD: Lipase: 33 U/L (ref 11–51)

## 2021-07-23 MED ORDER — LACTULOSE 10 GM/15ML PO SOLN: 10.0000 g | ORAL | Status: DC

## 2021-07-23 MED ORDER — PROPRANOLOL HCL 20 MG PO TABS
10.0000 mg | ORAL_TABLET | Freq: Two times a day (BID) | ORAL | Status: DC
  Administered 2021-07-23 – 2021-07-31 (×14): 10 mg via ORAL
  Filled 2021-07-23 (×18): qty 1

## 2021-07-23 MED ORDER — LACTULOSE 10 GM/15ML PO SOLN
10.0000 g | ORAL | Status: AC
  Filled 2021-07-23 (×2): qty 15

## 2021-07-23 MED ORDER — IOHEXOL 300 MG/ML  SOLN
100.0000 mL | Freq: Once | INTRAMUSCULAR | Status: AC | PRN
  Administered 2021-07-23: 100 mL via INTRAVENOUS

## 2021-07-23 MED ORDER — OXYCODONE HCL 5 MG PO TABS
5.0000 mg | ORAL_TABLET | ORAL | Status: DC | PRN
  Administered 2021-07-23: 5 mg via ORAL
  Filled 2021-07-23 (×2): qty 1

## 2021-07-23 MED ORDER — FUROSEMIDE 40 MG PO TABS
40.0000 mg | ORAL_TABLET | Freq: Every day | ORAL | Status: DC
  Administered 2021-07-24 – 2021-07-31 (×8): 40 mg via ORAL
  Filled 2021-07-23: qty 1
  Filled 2021-07-23: qty 2
  Filled 2021-07-23 (×6): qty 1

## 2021-07-23 MED ORDER — NICOTINE 21 MG/24HR TD PT24
21.0000 mg | MEDICATED_PATCH | Freq: Every day | TRANSDERMAL | Status: DC
  Administered 2021-07-23 – 2021-07-26 (×4): 21 mg via TRANSDERMAL
  Filled 2021-07-23 (×4): qty 1

## 2021-07-23 MED ORDER — SPIRONOLACTONE 100 MG PO TABS
100.0000 mg | ORAL_TABLET | Freq: Every day | ORAL | Status: DC
  Administered 2021-07-24 – 2021-07-31 (×8): 100 mg via ORAL
  Filled 2021-07-23 (×8): qty 1

## 2021-07-23 MED ORDER — DIPHENHYDRAMINE HCL 50 MG/ML IJ SOLN
25.0000 mg | Freq: Once | INTRAMUSCULAR | Status: AC
Start: 2021-07-23 — End: 2021-07-23
  Administered 2021-07-23: 25 mg via INTRAVENOUS
  Filled 2021-07-23: qty 1

## 2021-07-23 MED ORDER — HYDROMORPHONE HCL 1 MG/ML IJ SOLN
1.0000 mg | Freq: Once | INTRAMUSCULAR | Status: AC
  Administered 2021-07-23: 1 mg via INTRAVENOUS
  Filled 2021-07-23: qty 1

## 2021-07-23 MED ORDER — THIAMINE HCL 100 MG PO TABS
100.0000 mg | ORAL_TABLET | Freq: Every day | ORAL | Status: DC
  Administered 2021-07-24 – 2021-07-31 (×8): 100 mg via ORAL
  Filled 2021-07-23 (×7): qty 1

## 2021-07-23 MED ORDER — FOLIC ACID 1 MG PO TABS
1.0000 mg | ORAL_TABLET | Freq: Every day | ORAL | Status: DC
  Administered 2021-07-24 – 2021-07-31 (×8): 1 mg via ORAL
  Filled 2021-07-23 (×8): qty 1

## 2021-07-23 NOTE — ED Provider Notes (Signed)
[name]ERGENCY DEPARTMENT Provider Note    CSN: [id number] Arrival date & time: 07/23/21 1051  History Chief Complaint  Patient presents with   Abdominal Pain    Antonio [name] is a 51 y.o. male with history of alcoholic liver disease, continues to drink heavily. He has had increased swelling in his abdomen for the last month or so. No fever. No vomiting.    Home Medications Prior to Admission medications   Medication Sig Start Date End Date Taking? Authorizing Provider  acetaminophen (TYLENOL) 325 MG tablet Take 1-2 tablets (325-650 mg total) by mouth every 4 (four) hours as needed for mild pain. 07/04/19   Love, Ivan Anchors, PA-C  folic acid (FOLVITE) 1 MG tablet Take 1 tablet (1 mg total) by mouth daily. 07/08/19   Love, Ivan Anchors, PA-C  gabapentin (NEURONTIN) 300 MG capsule Take 1 capsule (300 mg total) by mouth 4 (four) times daily -  with meals and at bedtime. 07/08/19   Love, Ivan Anchors, PA-C  lactulose (CHRONULAC) 10 GM/15ML solution Take 15 mLs (10 g total) by mouth 2 (two) times daily. 07/08/19   Love, Ivan Anchors, PA-C  lidocaine (LIDODERM) 5 % Place 3 patches onto the skin daily. Apply to ribs at 7 am and remove at 7 pm daily 07/08/19   Love, Ivan Anchors, PA-C  Multiple Vitamin (MULTIVITAMIN WITH MINERALS) TABS tablet Take 1 tablet by mouth daily. 01/10/19   Guilford Shi, MD  thiamine 100 MG tablet Take 1 tablet (100 mg total) by mouth daily. 07/08/19   Love, Ivan Anchors, PA-C     Allergies    Fish allergy, Sulfa antibiotics, Bee venom, and Latex   Review of Systems   Review of Systems Please see HPI for pertinent positives and negatives  Physical Exam BP (!) 133/95    Pulse 66    Temp 97.7 F (36.5 C) (Oral)    Resp 15    SpO2 98%   Physical Exam Vitals and nursing note reviewed.  Constitutional:      Appearance: Normal appearance.  HENT:     Head: Normocephalic and atraumatic.     Nose: Nose normal.     Mouth/Throat:     Mouth: Mucous membranes are moist.   Eyes:     Extraocular Movements: Extraocular movements intact.     Conjunctiva/sclera: Conjunctivae normal.  Cardiovascular:     Rate and Rhythm: Normal rate.  Pulmonary:     Effort: Pulmonary effort is normal.     Breath sounds: Normal breath sounds.  Abdominal:     General: Abdomen is protuberant. There is distension.     Palpations: There is shifting dullness and fluid wave.     Tenderness: There is generalized abdominal tenderness. There is no guarding. Negative signs include Murphy's sign and McBurney's sign.     Comments: Midline scar from remote splenectomy (traumatic)  Musculoskeletal:        General: No swelling. Normal range of motion.     Cervical back: Neck supple.  Skin:    General: Skin is warm and dry.  Neurological:     General: No focal deficit present.     Mental Status: He is alert.  Psychiatric:        Mood and Affect: Mood normal.    ED Results / Procedures / Treatments   EKG None  Procedures Procedures  Medications Ordered in the ED Medications  iohexol (OMNIPAQUE) 300 MG/ML solution 100 mL (100 mLs Intravenous Contrast Given 07/23/21 1554)  Initial Impression and Plan Patient with known alcoholic liver disease here with exam concerning for ascites, never had paracentesis before. Limited bedside US shows large volume ascites. Will check CT abdomen to evaluate prior liver mass and to ascertain degree of ascites. He is not guarding so low likelihood of peritonitis. Will likely require admission for further evaluation of acute on chronic liver failure. INR added to labs.   ED Course   Clinical Course as of 07/23/21 1606  Tue Jul 23, 2021  1604 Care of the patient signed out to Dr. Ralene Bathe at the change of shift.   [CS]    Clinical Course User Index [CS] Truddie Hidden, MD     MDM Rules/Calculators/A&P Medical Decision Making Amount and/or Complexity of Data Reviewed Labs: ordered. Decision-making details documented in ED  Course. Radiology: ordered and independent interpretation performed. Decision-making details documented in ED Course.  Risk Prescription drug management. Decision regarding hospitalization.    Final Clinical Impression(s) / ED Diagnoses Final diagnoses:  Acute on chronic alcoholic liver disease ([place])    Rx / DC Orders ED Discharge Orders     None        Truddie Hidden, MD 07/23/21 1606

## 2021-07-23 NOTE — ED Triage Notes (Signed)
Pt presents for eval of generalized abdominal pain and tightness, ongoing since November but worse since yesterday. Also reports a bulging mass in upper abdomen. Drinks alcohol daily, approximately 7 beers and says he is trying to cut back.

## 2021-07-23 NOTE — ED Notes (Signed)
Patient transported to CT 

## 2021-07-23 NOTE — H&P (Signed)
Date: 07/23/2021               Patient Name:  Antonio [name] MRN: [id number]  DOB: 10-10-1970 Age / Sex: 51 y.o., male   PCP: Mack Hook, MD         Medical Service: Internal Medicine Teaching Service         Attending Physician: Dr. Aldine Contes, MD    First Contact: Dr. Lorin Glass Pager: [phone]  Second Contact: Dr. Eulas Post Pager: [phone]       After Hours (After 5p/  First Contact Pager: [phone]  weekends / holidays): Second Contact Pager: [phone]   Chief Complaint: Abdominal pain/tightness  History of Present Illness: Antonio [name] is a 51 y.o. male with a PMHx of cirrhosis, alcohol and polysubstance abuse, hepatitis B and C, prior splenectomy ~17 years ago, and depression who presents to [facility] with complaints of abdominal pain.  Partner is present at bedside providing some history, however history is otherwise limited due to patient recently receiving Benadryl and Dilaudid.  The patient states that he has had abdominal pain since November of last year.  His pain has gotten progressively worse since then. He states that the pain is diffuse and has been "so severe that he felt like he was dying."  Denies nausea/vomiting.  Denies hematemesis, hematochezia, or hematuria.  His partner also states that his abdomen appeared more swollen lately.  Last bowel movement was this morning, and his stool was normal in caliber.  He is able to eat and drink without issue.  Per partner, after he drinks alcohol his abdominal pain gets worse.  The patient says he was diagnosed with cirrhosis no more than 7 years ago.  In January 2021, he had an MRI of abdomen which showed hepatic cirrhosis with solitary lesion with recommendation for follow-up MRI 3 to 6 months later to ensure stability. However, he states that he did not pursue follow-up imaging or follow-up with a doctor.  States that he finally agreed to see the doctor today because his pain was so severe.  No other  complaints or concerns today.  Meds:  No outpatient medications have been marked as taking for the 07/23/21 encounter [facility] Encounter).   Allergies: Allergies as of 07/23/2021 - Review Complete 07/23/2021  Allergen Reaction Noted   Fish allergy Itching and Swelling 01/05/2013   Sulfa antibiotics Swelling 06/01/2011   Bee venom Swelling 10/05/2013   Latex  12/28/2018   Past Medical History:  Diagnosis Date   Alcoholic ([place])    in recovery since 02/2018   Asthma    Complication of anesthesia    hard to wake up   Depression 09/17/2018   h/o suicidal ideation, previously homeless.   Hepatitis    hep b and c   History of ETOH abuse    Polysubstance overdose    Ruptured spleen    Septic arthritis of left ankle ([place]) 11/2016   Family History:  Family History  Problem Relation Age of Onset   Hepatitis Mother    Alcohol abuse Brother     Social History: The patient is originally from [place]. He has lived in the area for the past 12 years at least. He used to drink 15-20 pack of beers daily, however since December of last year, he has cut back to 6 drinks a day. He has been in a detox program recently for his alcohol use. Smokes about 1-2 cigarettes a day, since he was a teenager. No  current drug use. ° °Review of Systems: °A complete ROS was negative except as per HPI.  ° °Physical Exam: °Blood pressure (!) 138/97, pulse 63, temperature 97.7 °F (36.5 °C), temperature source Oral, resp. rate 16, SpO2 97 %. °General: NAD, sleepy in the setting of recently receiving Benadryl and Dilaudid °HE: Normocephalic, atraumatic, EOMI, Conjunctivae normal °ENT: No congestion, no rhinorrhea, no exudate or erythema  °Cardiovascular: Normal rate, regular rhythm. No murmurs, rubs, or gallops °Pulmonary: Effort normal, breath sounds normal. No wheezes, rales, or rhonchi °Abdominal: Abdomen is distended, patient has diffuse tenderness to palpation that is most notable in the suprapubic region.   Bowel sounds are present throughout.  Midline scars present from remote splenectomy. °Musculoskeletal: no swelling, deformity, injury or tenderness in extremities °Skin: Warm, dry, no bruising, erythema, or rash °Neuro: Alert and oriented to situation, no asterixis °Psychiatric/Behavioral: normal mood, normal behavior   ° °CT Abdomen/Pelvis W Contrast: °IMPRESSION: °1. Cirrhosis with hepatic lesions. Dominant lesion in the region of °segment 4 and segment 8 has markedly enlarged since 2021. There is °evidence for tumor invasion into the hepatic veins with tumor °thrombus in the IVC as described. 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 °2. Ascites with esophageal varices. Findings are compatible with portal hypertension. °3. Splenectomy. ° ° °Assessment & Plan by Problem: °Principal Problem: °  Decompensated hepatic cirrhosis (HCC) ° °Decompensated cirrhosis °Alcohol abuse °H/o Hepatitis B/C °C/f Hepatocellular carcinoma °The patient is presenting here today with complaints of abdominal pain with a history of cirrhosis diagnosed several years ago. Most recently, in May 2021, MRI of the abdomen showed hepatic cirrhosis, but the patient reports that he has not followed up with a PCP for further work-up. His cirrhosis has significantly worsened on imaging today when compared to his prior MRI.   ° °Patient was afebrile and hemodynamically stable on arrival.  Labs are notable for Na 135, BUN 6, Cr 0.51, AST 192, ALT 58, alk phos 178, total bili 3.6.  Platelets 105.  PT 18.6, INR 1.6. MELD Na 19% (3-4% 90d mortality). Limited bedside ultrasound in the ED showed large volume ascites. CT abdomen pelvis is concerning for hepatocellular carcinoma with invasion of the IVC with new ascites and multiple liver masses.   ° °Believe that his abdominal pain is due to significant ascites as noted on exam and on bedside ultrasound.  His cirrhosis could be due to alcohol use, hepatitis C, or a combination of  the two.  At this time, we will start meds as below and will pursue paracentesis in the AM.  Will hold off on SBP prophylaxis at this time. ° °-Will consult IR in the morning for paracentesis °-Will consult oncology in the a.m. for c/f HCC °-Continue Lasix 40 mg p.o. daily and spironolactone 100 mg p.o. daily °-Propanolol 10 mg po BID °-Lactulose 10 g po q2h °-HIV, HCV Ab pending °-Trend CBC, CMP °-Oxycodone 5 mg p.o. every 4 hours as needed for pain °-Thiamine and folic acid ° °Tobacco use °-Nicotine patch ° °Dispo: Admit patient to Observation with expected length of stay less than 2 midnights. ° °Signed: °,  E, MD °07/23/2021, 6:44 PM  °Pager: [phone]  °After 5pm on weekdays and 1pm on weekends: On Call pager: [phone] ° °

## 2021-07-23 NOTE — ED Provider Notes (Signed)
Patient care assumed pending CT abdomen pelvis.  Patient has a history of alcohol abuse and alcoholic liver disease.  He did have an abnormal MRI in January 2021 but did not receive follow-up with for this study.  CT abdomen pelvis today concerning for hepatocellular carcinoma with invasion of the IVC with new ascites, multiple liver masses.  Discussed with patient and wife findings of studies and recommendation for admission for ongoing work-up and treatment.  Patient is in agreement with treatment plan.  He was treated with Dilaudid for his pain.  After administration of the medication he developed severe burning to the right upper extremity at the site of his IV placement.  He did have a sending erythema and edema in the distribution of the vein in the distal forearm up to the [facility] fossa on the right upper extremity.  No systemic symptoms.  He was treated with Benadryl.  Medicine consulted for ongoing work-up for his hepatic mass and decompensated cirrhosis.   Quintella Reichert, MD 07/23/21 [phone]

## 2021-07-23 NOTE — ED Provider Triage Note (Signed)
Emergency Medicine Provider Triage Evaluation Note  Antonio [name] , a 51 y.o. male  was evaluated in triage.  Pt complains of generalized abdominal pain. States that same has been ongoing since November but worsening in the past 2 days. Endorses that he drinks approximately 7 beers per day. Denies any n/v/d. Also endorses ventral hernia from previous splenectomy that occasionally 'gets stuck' however it is not at present. Pain is generalized throughout his abdomen. Also endorses some abdominal distention which is baseline for him.  Review of Systems  Positive:  Negative: See above  Physical Exam  BP (!) 138/102    Pulse 84    Temp 97.7 F (36.5 C) (Oral)    Resp 16    SpO2 98%  Gen:   Awake, no distress   Resp:  Normal effort  MSK:   Moves extremities without difficulty  Other:  Abdominal distention present with healed midline surgical scar. No incarcerated hernia present on exam  Medical Decision Making  Medically screening exam initiated at 11:34 AM.  Appropriate orders placed.  Thermon Zulauf was informed that the remainder of the evaluation will be completed by another provider, this initial triage assessment does not replace that evaluation, and the importance of remaining in the ED until their evaluation is complete.     Bud Face, PA-C 07/23/21 1143

## 2021-07-24 ENCOUNTER — Observation Stay (HOSPITAL_COMMUNITY): Payer: Self-pay

## 2021-07-24 ENCOUNTER — Inpatient Hospital Stay (HOSPITAL_COMMUNITY): Payer: Self-pay

## 2021-07-24 DIAGNOSIS — K7290 Hepatic failure, unspecified without coma: Secondary | ICD-10-CM

## 2021-07-24 DIAGNOSIS — K7460 Unspecified cirrhosis of liver: Secondary | ICD-10-CM

## 2021-07-24 HISTORY — PX: IR PARACENTESIS: IMG2679

## 2021-07-24 LAB — GRAM STAIN

## 2021-07-24 LAB — CBC
HCT: 35.1 % — ABNORMAL LOW (ref 39.0–52.0)
Hemoglobin: 12.9 g/dL — ABNORMAL LOW (ref 13.0–17.0)
MCH: 32.4 pg (ref 26.0–34.0)
MCHC: 36.8 g/dL — ABNORMAL HIGH (ref 30.0–36.0)
MCV: 88.2 fL (ref 80.0–100.0)
Platelets: 77 10*3/uL — ABNORMAL LOW (ref 150–400)
RBC: 3.98 MIL/uL — ABNORMAL LOW (ref 4.22–5.81)
RDW: 20.6 % — ABNORMAL HIGH (ref 11.5–15.5)
WBC: 6 10*3/uL (ref 4.0–10.5)
nRBC: 0 % (ref 0.0–0.2)

## 2021-07-24 LAB — COMPREHENSIVE METABOLIC PANEL
ALT: 54 U/L — ABNORMAL HIGH (ref 0–44)
AST: 183 U/L — ABNORMAL HIGH (ref 15–41)
Albumin: 1.8 g/dL — ABNORMAL LOW (ref 3.5–5.0)
Alkaline Phosphatase: 167 U/L — ABNORMAL HIGH (ref 38–126)
Anion gap: 6 (ref 5–15)
BUN: 7 mg/dL (ref 6–20)
CO2: 20 mmol/L — ABNORMAL LOW (ref 22–32)
Calcium: 8.6 mg/dL — ABNORMAL LOW (ref 8.9–10.3)
Chloride: 109 mmol/L (ref 98–111)
Creatinine, Ser: 0.57 mg/dL — ABNORMAL LOW (ref 0.61–1.24)
GFR, Estimated: >60 mL/min (ref >60)
Glucose, Bld: 93 mg/dL (ref 70–99)
Potassium: 4.2 mmol/L (ref 3.5–5.1)
Sodium: 135 mmol/L (ref 135–145)
Total Bilirubin: 3.6 mg/dL — ABNORMAL HIGH (ref 0.3–1.2)
Total Protein: 7 g/dL (ref 6.5–8.1)

## 2021-07-24 LAB — BODY FLUID CELL COUNT WITH DIFFERENTIAL
Eos, Fluid: 1 %
Lymphs, Fluid: 32 %
Monocyte-Macrophage-Serous Fluid: 54 % (ref 50–90)
Neutrophil Count, Fluid: 13 % (ref 0–25)
Total Nucleated Cell Count, Fluid: 279 cu mm (ref 0–1000)

## 2021-07-24 LAB — LACTATE DEHYDROGENASE, PLEURAL OR PERITONEAL FLUID: LD, Fluid: 41 U/L — ABNORMAL HIGH (ref 3–23)

## 2021-07-24 LAB — ALBUMIN, PLEURAL OR PERITONEAL FLUID: Albumin, Fluid: <1.5 g/dL

## 2021-07-24 MED ORDER — INFLUENZA VAC SPLIT QUAD 0.5 ML IM SUSY
0.5000 mL | PREFILLED_SYRINGE | INTRAMUSCULAR | Status: AC
  Administered 2021-07-25: 0.5 mL via INTRAMUSCULAR
  Filled 2021-07-24: qty 0.5

## 2021-07-24 MED ORDER — ALBUTEROL SULFATE (2.5 MG/3ML) 0.083% IN NEBU: 2.5000 mg | INHALATION_SOLUTION | RESPIRATORY_TRACT | Status: DC | PRN

## 2021-07-24 MED ORDER — ADULT MULTIVITAMIN W/MINERALS CH
1.0000 | ORAL_TABLET | Freq: Every day | ORAL | Status: DC
  Administered 2021-07-24 – 2021-07-31 (×8): 1 via ORAL
  Filled 2021-07-24 (×8): qty 1

## 2021-07-24 MED ORDER — LORAZEPAM 1 MG PO TABS: 1.0000 mg | ORAL_TABLET | ORAL | Status: AC | PRN

## 2021-07-24 MED ORDER — LIDOCAINE HCL 1 % IJ SOLN
INTRAMUSCULAR | Status: AC
  Administered 2021-07-24: 10 mL
  Filled 2021-07-24: qty 20

## 2021-07-24 MED ORDER — LORAZEPAM 2 MG/ML IJ SOLN: 1.0000 mg | INTRAMUSCULAR | Status: AC | PRN

## 2021-07-24 MED ORDER — HYDROMORPHONE HCL 1 MG/ML IJ SOLN
0.5000 mg | INTRAMUSCULAR | Status: DC | PRN
  Administered 2021-07-24 – 2021-07-28 (×16): 0.5 mg via INTRAVENOUS
  Filled 2021-07-24 (×15): qty 0.5
  Filled 2021-07-24: qty 1

## 2021-07-24 MED ORDER — GADOBUTROL 1 MMOL/ML IV SOLN
7.0000 mL | Freq: Once | INTRAVENOUS | Status: AC | PRN
  Administered 2021-07-24: 7 mL via INTRAVENOUS

## 2021-07-24 NOTE — Progress Notes (Signed)
Brief Hematology/Oncology Note  Clinical Summary: [name] is a 51 year old male with history of cirrhosis who presents with worsening ascites.   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  --AST 183, ALT 54, Bilirubin 3.6, INR 1.6 and albumin 1.8. Consistent with Antonio [name] C   O:  Vitals:   07/24/21 1753 07/24/21 1928  BP: (!) 123/91 (!) 132/105  Pulse: 67 64  Resp: 18 18  Temp: 98.2 F (36.8 C) 98.6 F (37 C)  SpO2:  97%   CMP Latest Ref Rng & Units 07/24/2021 07/23/2021 08/29/2019  Glucose 70 - 99 mg/dL 93 110(H) 95  BUN 6 - 20 mg/dL 7 6 6   Creatinine 0.61 - 1.24 mg/dL 0.57(L) 0.51(L) 0.72  Sodium 135 - 145 mmol/L 135 135 138  Potassium 3.5 - 5.1 mmol/L 4.2 3.9 4.1  Chloride 98 - 111 mmol/L 109 107 108  CO2 22 - 32 mmol/L 20(L) 22 24  Calcium 8.9 - 10.3 mg/dL 8.6(L) 8.7(L) 8.2(L)  Total Protein 6.5 - 8.1 g/dL 7.0 7.6 7.5  Total Bilirubin 0.3 - 1.2 mg/dL 3.6(H) 3.6(H) 1.5(H)  Alkaline Phos 38 - 126 U/L 167(H) 178(H) 309(H)  AST 15 - 41 U/L 183(H) 192(H) 151(H)  ALT 0 - 44 U/L 54(H) 58(H) 64(H)   CBC Latest Ref Rng & Units 07/24/2021 07/23/2021 08/29/2019  WBC 4.0 - 10.5 K/uL 6.0 8.1 7.6  Hemoglobin 13.0 - 17.0 g/dL 12.9(L) 13.0 12.8(L)  Hematocrit 39.0 - 52.0 % 35.1(L) 35.2(L) 37.7(L)  Platelets 150 - 400 K/uL 77(L) 105(L) 150     Assessment/Plan:  # Multifocal Hepatic Masses in Setting of Cirrhosis # Cirrhosis 2/2 Hepatitis B, C, and EtOH Abuse # Decompensated Cirrhosis --findings on MRI are LI-RADS-category M. This is non-definitive for hepatocellular carcinoma.  --will order tumor markers including CA 19-9, CEA, and Alpha fetoprotein --recommend consideration of IR guided biopsy for confirmation of diagnosis --patient is Antonio [name] C with Hepatitis B, C and ongoing ETOH abuse. He is a very poor candidate  for systemic chemotherapy --patient is not a candidate for liver transplant as the largest mass is >5 cm and there is marcrovascular invasion.  --recommend consulting palliative care for consideration of hospice --Oncology will follow   Ledell Peoples, MD Department of Hematology/Oncology [employer] at [facility] Phone: [phone] Pager: [phone] Email: [email or web address]

## 2021-07-24 NOTE — Progress Notes (Incomplete)
Patient arrived to [facility]

## 2021-07-24 NOTE — ED Notes (Signed)
Pt transported to IR for paracentesis.  

## 2021-07-24 NOTE — Progress Notes (Signed)
Request was made for liver lesion biopsy. Dr. Anselm Pancoast, IR, recommends Liver MR and AFP level. If these tests are not conclusive, liver lesion biopsy would be then be considered. Ordering provider notified.    Narda Rutherford, AGNP-BC 07/24/2021, 1:03 PM

## 2021-07-24 NOTE — Procedures (Signed)
PROCEDURE SUMMARY:  Successful US guided diagnostic and therapeutic paracentesis from RLQ.  Yielded 2.6 L of clear, yellow fluid.  No immediate complications.  Pt tolerated well.   Specimen was sent for labs.  EBL < 1 mL  Tyson Alias, AGNP 07/24/2021 10:24 AM

## 2021-07-24 NOTE — Progress Notes (Signed)
Patient arrived to [facility] room 18 alert and oriented x4. Pain level 0 just slight tenderness in abdomen. Patient currently remaining NPO for MRI @5 . Bed is in lowest postion, call light in reach. Will continue to monitor  pt.

## 2021-07-24 NOTE — Progress Notes (Signed)
Date: 07/24/2021  Patient name: Antonio [name]  Medical record number: [id number]  Date of birth: [birth year]   I have seen and evaluated Antonio [name] and discussed their care with the Residency Team.  In brief, patient is a 51 year old male with a past medical history of cirrhosis, alcohol and polysubstance abuse, hepatitis B and C, prior splenectomy and depression who presented to the ED with worsening abdominal pain.  Patient states that he has chronic abdominal pain and this is been present since November of last year.  Patient states that his abdominal pain is progressively worsened since then and has acutely worsened over the last week.  Patient is also noted progressively worsening abdominal distention.  No nausea or vomiting, no diarrhea, no lightheadedness, no syncope, no focal weakness, no tingling or numbness, no headache, no blurry vision, no chest pain, no shortness of breath, no fevers or chills.  Patient was diagnosed with cirrhosis approximately 7 years ago.  He has not had good follow-up as an outpatient.  In January 2021 he was noted to have a solitary liver lesion and was recommended to have a follow-up in 3 to 6 months but has not followed up with imaging or with the physician.  Patient states that he continues to drink alcohol and has about 6-7 beers a day.  He also continues to smoke and has smokes about half a pack a day.  Patient came to ED secondary to his worsening abdominal pain.  Today, patient states that his pain is better controlled on his current pain regimen.  He is worried about his possible cancer diagnosis and is concerned about the prognosis.  We discussed this with him today.  PMHx, Fam Hx, and/or Soc Hx : As per resident admit note  Vitals:   07/24/21 0915 07/24/21 1000  BP: (!) 147/101 (!) 129/91  Pulse:    Resp:    Temp:    SpO2:     General: Awake, alert, oriented x3, NAD CVS: Regular rate and rhythm, normal heart sounds Lungs: Scattered  bilateral expiratory wheeze noted on exam Abdomen: Soft, distended, moderate diffuse tenderness to palpation noted worse in the suprapubic region, normoactive bowel sounds, midline scar present from remote splenectomy, mildly distended veins noted in periumbilical region Extremities: No edema noted, nontender to palpation Psych: Normal mood and affect HEENT: Normocephalic, atraumatic Skin: Warm and dry, no ecchymosis noted, no palmar erythema, no gynecomastia Neuro: Patient with mild tremor on exam but no other signs of alcohol withdrawal  Assessment and Plan: I have seen and evaluated the patient as outlined above. I agree with the formulated Assessment and Plan as detailed in the residents' note, with the following changes:   1.  Decompensated cirrhosis/likely hepatocellular carcinoma: -Patient has a history of cirrhosis for the last 7 years but continues to drink alcohol about 6-7 beers a day.  He was noted to have a lesion in his liver in January 2021 but did not follow-up for repeat imaging.  On CT abdomen/pelvis done here he has dominant lesion in the region of segment 4 and segment 8 of his liver with evidence for tumor invasion into the hepatic veins as well as tumor thrombus into the IVC which are suggestive of multifocal hepatocellular carcinoma. -Patient with a MELD sodium score of 19 point consistent with 3 to 4% 90-day mortality -The etiology of the patient's ascites is likely secondary to decompensated cirrhosis but could be secondary to underlying hepatocellular carcinoma. -IR obtain a paracentesis today and obtain 2.6 L of  clear yellow fluid.  We will follow-up fluid culture, Gram stain, cytology, cell count and albumin.  We will need to calculate SAAG to differentiate between portal hypertensive versus non portal hypertensive cause of ascites -Case was discussed with oncology as well.  They would like to obtain an ultrasound-guided biopsy of liver lesion -Continue with pain control  for now -We will attempt diuresis with Lasix 40 mg daily as well as spironolactone 100 mg daily -We will continue propanolol for underlying esophageal varices -We will continue lactulose to prevent hepatic encephalopathy.  Currently patient's mental status appears to be at baseline and he has no asterixis -No further work-up at this time  2.  Alcohol use disorder: -Patient has history of alcohol use disorder and treated 6-7 beers a day.  His last drink was the morning of January 31. -Currently patient has no signs of alcohol withdrawal except for mild tremor -We will continue to monitor closely -Patient placed on CIWA protocol with Ativan as needed -If he does show signs of alcohol withdrawal we will start the patient on a Librium taper as well -Continue with thiamine folic acid -No further work-up at this time   Aldine Contes, MD 2/1/202311:43 AM

## 2021-07-24 NOTE — Progress Notes (Signed)
HD#0 SUBJECTIVE:  Patient Summary: Antonio [name] is a 51 y.o. with a pertinent PMH of cirrhosis, alcohol and polysubstance use disorder, hepatits B and C, spl, who presented with complaints of abdominal pain and tightness and admitted for cirrhosis.   Overnight Events: no acute events overnight    Interm History: Patient states he is feeling okay this morning. He does feel a little shaky. Continues to report abdominal pain and was switched to oxycodone from dilaudid which is not controlling his pain as well. He is not having nausea or vomiting, and is tolerating PO intake and has bee able to eat the entirety of his breakfast. No fever or chills.  OBJECTIVE:  Vital Signs: Vitals:   07/24/21 0243 07/24/21 0901 07/24/21 0915 07/24/21 1000  BP: (!) 137/96 (!) 144/102 (!) 147/101 (!) 129/91  Pulse: 73 69    Resp: 16 18    Temp: 97.6 F (36.4 C) 97.9 F (36.6 C)    TempSrc: Oral Oral    SpO2: 96% 98%     Supplemental O2: Room Air SpO2: 98 %  There were no vitals filed for this visit.  No intake or output data in the 24 hours ending 07/24/21 1058 Net IO Since Admission: No IO data has been entered for this period [07/24/21 1058]  Physical Exam: Physical Exam Constitutional:      General: He is not in acute distress.    Appearance: He is well-developed and normal weight. He is not ill-appearing, toxic-appearing or diaphoretic.  HENT:     Head: Normocephalic and atraumatic.     Mouth/Throat:     Mouth: Mucous membranes are moist.  Cardiovascular:     Rate and Rhythm: Normal rate and regular rhythm.     Heart sounds: Normal heart sounds. No murmur heard.   No gallop.  Pulmonary:     Effort: Pulmonary effort is normal.     Breath sounds: Wheezing present.  Abdominal:     General: Abdomen is protuberant. A surgical scar is present. Bowel sounds are normal. There is distension.     Tenderness: There is generalized abdominal tenderness.  Neurological:     Mental Status:  He is alert.    Patient Lines/Drains/Airways Status     Active Line/Drains/Airways     Name Placement date Placement time Site Days   Peripheral IV 07/23/21 20 G Anterior;Right Forearm 07/23/21  1530  Forearm  1   Incision (Closed) 06/30/19 Chest Left 06/30/19  1826  -- 755            Pertinent Labs: CBC Latest Ref Rng & Units 07/24/2021 07/23/2021 08/29/2019  WBC 4.0 - 10.5 K/uL 6.0 8.1 7.6  Hemoglobin 13.0 - 17.0 g/dL 12.9(L) 13.0 12.8(L)  Hematocrit 39.0 - 52.0 % 35.1(L) 35.2(L) 37.7(L)  Platelets 150 - 400 K/uL 77(L) 105(L) 150    CMP Latest Ref Rng & Units 07/24/2021 07/23/2021 08/29/2019  Glucose 70 - 99 mg/dL 93 110(H) 95  BUN 6 - 20 mg/dL 7 6 6   Creatinine 0.61 - 1.24 mg/dL 0.57(L) 0.51(L) 0.72  Sodium 135 - 145 mmol/L 135 135 138  Potassium 3.5 - 5.1 mmol/L 4.2 3.9 4.1  Chloride 98 - 111 mmol/L 109 107 108  CO2 22 - 32 mmol/L 20(L) 22 24  Calcium 8.9 - 10.3 mg/dL 8.6(L) 8.7(L) 8.2(L)  Total Protein 6.5 - 8.1 g/dL 7.0 7.6 7.5  Total Bilirubin 0.3 - 1.2 mg/dL 3.6(H) 3.6(H) 1.5(H)  Alkaline Phos 38 - 126 U/L 167(H) 178(H) 309(H)  AST 15 - 41 U/L 183(H) 192(H) 151(H)  ALT 0 - 44 U/L 54(H) 58(H) 64(H)    No results for input(s): GLUCAP in the last 72 hours.   Pertinent Imaging: CT Abdomen Pelvis W Contrast  Result Date: 07/23/2021 CLINICAL DATA:  Ascites.  Alcoholic liver disease. EXAM: CT ABDOMEN AND PELVIS WITH CONTRAST TECHNIQUE: Multidetector CT imaging of the abdomen and pelvis was performed using the standard protocol following bolus administration of intravenous contrast. RADIATION DOSE REDUCTION: This exam was performed according to the departmental dose-optimization program which includes automated exposure control, adjustment of the mA and/or kV according to patient size and/or use of iterative reconstruction technique. CONTRAST:  150mL OMNIPAQUE IOHEXOL 300 MG/ML  SOLN COMPARISON:  Abdominal MRI 07/06/2019 and CT 02/12/2016 FINDINGS: Lower chest: Calcified granuloma  in the left lower lobe on sequence 5 image 9 measures 5 mm. Hazy densities at the posterior lung bases are suggestive for atelectasis. No pleural effusions. Hepatobiliary: Liver is diffusely heterogeneous and nodular. Findings are compatible with cirrhosis. There is a dominant hepatic lesion situated between segment 4A and segment 8 that appears to have some peripheral enhancement. This lesion corresponds with the previously seen lesion from 2021. This lesion has markedly enlarged in size measuring 5.6 x 5.7 x 6.2 cm and previously this lesion measured up to 3.3 cm. In addition, there is a new lesion at the hepatic dome on sequence 2 image 8 that is better characterized on the sagittal images on sequence 7, image 29 measuring up to 2.4 cm. There appears to be tumor extending from the lesions toward the IVC and extending along the hepatic veins. There is evidence for tumor thrombus in the IVC on sequence 3, image 11 that measures 3.0 x 2.4 x 2.7 cm. Difficult to exclude additional small hepatic lesions. Normal appearance of the gallbladder. No significant biliary dilatation. Pancreas: Unremarkable. No pancreatic ductal dilatation or surrounding inflammatory changes. Spleen: Splenectomy. Adrenals/Urinary Tract: Normal adrenal glands. Normal appearance of both kidneys without hydronephrosis. Normal appearance of the urinary bladder. Stomach/Bowel: Normal appendix. No evidence for bowel dilatation or focal bowel inflammation. Normal appearance of the stomach. Vascular/Lymphatic: Large filling defect in the upper IVC near the hepatic vein drainage compatible with tumor thrombus. Limited evaluation of the hepatic veins on this examination. Main portal venous system is patent on the delayed images. Evidence for esophageal varices. Normal caliber of the abdominal aorta with mild atherosclerotic disease. Main visceral arteries are patent. Mildly prominent inguinal lymph nodes bilaterally. No significant lymph node  enlargement in the abdomen or pelvis. Reproductive: Prostate is unremarkable. Other: Moderate amount of ascites in the abdomen and pelvis. Stranding and prominent vessels along the omentum are likely related to the cirrhosis and ascites. Negative for free air. Musculoskeletal: 8 mm focal lucency in the right iliac wing on sequence 3 image 59 appears chronic. There is also stable target shaped area of sclerosis in the right iliac bone image 59. Schmorl's nodes and subchondral sclerosis in lower lumbar spine. No suspicious bone lesions. IMPRESSION: 1. Cirrhosis with hepatic lesions. Dominant lesion in the region of segment 4 and segment 8 has markedly enlarged since 2021. There is evidence for tumor invasion into the hepatic veins with tumor thrombus in the IVC as described. 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 2. Ascites with esophageal varices. Findings are compatible with portal hypertension. 3. Splenectomy. Electronically Signed   By: Markus Daft M.D.   On: 07/23/2021 16:23    ASSESSMENT/PLAN:  Assessment: Principal Problem:  Decompensated hepatic cirrhosis (HCC)   Antonio [name] is a 51 y.o. with pertinent PMH of cirrhosis, alcohol and polysubstance use disorder, hepatits B and C, spl, who presented with complaints of abdominal pain and tightness and admitted for cirrhosis on hospital day 0  Cirrhosis Alcohol use disorder H/o untreated Hepatitis B/C C/f Hepatocellular carcinoma His cirrhosis could be due to alcohol use, hepatitis C, or a combination of the two.  Hemodynamically stable. No evidence of encephalopathy or bleeding at this time. MELD Na 19% (3-4% 90d mortality).  - Afebrile and normal WBC. Will hold off on SBP prophylaxis at this time. - Ultrasound showed large volume ascites. IR has been consulted and performed diagnostic and therapeutic paracentesis which yielded 2.6L yellow fluid. Fluid sent for analysis, will follow up. - CT abdomen pelvis is  concerning for hepatocellular carcinoma with invasion of the IVC with new ascites and multiple liver masses.  Oncology consulted and requesting tissue biopsy of lesion if possible. Will discuss with IR if this can be achieved. US liver biopsy order placed. - Pain likely due to ascites. Oxycodone 5 mg p.o. every 4 hours as needed for pain. 0.5mg  diladid q3hrs for severe pain.  -Continue Lasix 40 mg p.o. daily and spironolactone 100 mg p.o. daily -Propanolol 10 mg po BID -Lactulose 10 g po q2h -HIV, HCV Ab pending -Trend CBC, CMP  Alcohol use disorder Patient drinks 6-7 beers daily. Last drink was morning of 1/31.  Slight hand tremor on exam. No other neurologic findings at this time. Alert and oriented. No tachycardia currently.  - On CIWA protocol with ativan PRN.  -Thiamine and folic acid  Tobacco use - Reports he does not use any breathing medications.  - Audible wheeze on exam. Started on albuterol PRN. -Nicotine patch   Best Practice: Diet: Cardiac diet VTE: SCDs Start: 07/23/21 1838 Code: Full AB: none DISPO: Anticipated discharge to Home pending Medical stability.  Signature: Delene Ruffini, MD  Internal Medicine Resident, PGY-1 [facility]  Pager: [phone] 10:58 AM, 07/24/2021   Please contact the on call pager after 5 pm and on weekends at [phone].

## 2021-07-25 ENCOUNTER — Encounter (HOSPITAL_COMMUNITY): Payer: Self-pay | Admitting: Internal Medicine

## 2021-07-25 DIAGNOSIS — Z7189 Other specified counseling: Secondary | ICD-10-CM

## 2021-07-25 DIAGNOSIS — R160 Hepatomegaly, not elsewhere classified: Secondary | ICD-10-CM

## 2021-07-25 DIAGNOSIS — K709 Alcoholic liver disease, unspecified: Secondary | ICD-10-CM

## 2021-07-25 DIAGNOSIS — Z515 Encounter for palliative care: Secondary | ICD-10-CM

## 2021-07-25 DIAGNOSIS — R188 Other ascites: Secondary | ICD-10-CM

## 2021-07-25 LAB — COMPREHENSIVE METABOLIC PANEL
ALT: 50 U/L — ABNORMAL HIGH (ref 0–44)
AST: 172 U/L — ABNORMAL HIGH (ref 15–41)
Albumin: 1.6 g/dL — ABNORMAL LOW (ref 3.5–5.0)
Alkaline Phosphatase: 199 U/L — ABNORMAL HIGH (ref 38–126)
Anion gap: 8 (ref 5–15)
BUN: 9 mg/dL (ref 6–20)
CO2: 19 mmol/L — ABNORMAL LOW (ref 22–32)
Calcium: 8.4 mg/dL — ABNORMAL LOW (ref 8.9–10.3)
Chloride: 106 mmol/L (ref 98–111)
Creatinine, Ser: 0.51 mg/dL — ABNORMAL LOW (ref 0.61–1.24)
GFR, Estimated: >60 mL/min (ref >60)
Glucose, Bld: 105 mg/dL — ABNORMAL HIGH (ref 70–99)
Potassium: 4.6 mmol/L (ref 3.5–5.1)
Sodium: 133 mmol/L — ABNORMAL LOW (ref 135–145)
Total Bilirubin: 3.4 mg/dL — ABNORMAL HIGH (ref 0.3–1.2)
Total Protein: 6.7 g/dL (ref 6.5–8.1)

## 2021-07-25 LAB — CBC WITH DIFFERENTIAL/PLATELET
Abs Immature Granulocytes: 0.16 10*3/uL — ABNORMAL HIGH (ref 0.00–0.07)
Basophils Absolute: 0.1 10*3/uL (ref 0.0–0.1)
Basophils Relative: 1 %
Eosinophils Absolute: 0.3 10*3/uL (ref 0.0–0.5)
Eosinophils Relative: 3 %
HCT: 36.5 % — ABNORMAL LOW (ref 39.0–52.0)
Hemoglobin: 13.4 g/dL (ref 13.0–17.0)
Immature Granulocytes: 2 %
Lymphocytes Relative: 30 %
Lymphs Abs: 2.5 10*3/uL (ref 0.7–4.0)
MCH: 32 pg (ref 26.0–34.0)
MCHC: 36.7 g/dL — ABNORMAL HIGH (ref 30.0–36.0)
MCV: 87.1 fL (ref 80.0–100.0)
Monocytes Absolute: 1.8 10*3/uL — ABNORMAL HIGH (ref 0.1–1.0)
Monocytes Relative: 21 %
Neutro Abs: 3.6 10*3/uL (ref 1.7–7.7)
Neutrophils Relative %: 43 %
Platelets: 95 10*3/uL — ABNORMAL LOW (ref 150–400)
RBC: 4.19 MIL/uL — ABNORMAL LOW (ref 4.22–5.81)
RDW: 19.7 % — ABNORMAL HIGH (ref 11.5–15.5)
WBC: 8.4 10*3/uL (ref 4.0–10.5)
nRBC: 0 % (ref 0.0–0.2)

## 2021-07-25 LAB — AFP TUMOR MARKER: AFP, Serum, Tumor Marker: 59629 ng/mL — ABNORMAL HIGH (ref 0.0–6.9)

## 2021-07-25 MED ORDER — SENNA 8.6 MG PO TABS
1.0000 | ORAL_TABLET | Freq: Every day | ORAL | Status: DC
  Administered 2021-07-26: 8.6 mg via ORAL
  Filled 2021-07-25 (×2): qty 1

## 2021-07-25 MED ORDER — OXYCODONE HCL 5 MG/5ML PO SOLN
10.0000 mg | ORAL | Status: DC | PRN
Start: 2021-07-25 — End: 2021-07-27
  Administered 2021-07-26 – 2021-07-27 (×3): 10 mg via ORAL
  Filled 2021-07-25 (×3): qty 10

## 2021-07-25 MED ORDER — OXYCODONE HCL ER 10 MG PO T12A
10.0000 mg | EXTENDED_RELEASE_TABLET | Freq: Two times a day (BID) | ORAL | Status: DC
  Administered 2021-07-25 – 2021-07-27 (×4): 10 mg via ORAL
  Filled 2021-07-25 (×4): qty 1
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
## 2021-07-25 NOTE — Progress Notes (Signed)
HD#1 SUBJECTIVE:  Patient Summary: Antonio [name] is a 51 y.o. with a pertinent PMH of cirrhosis, alcohol and polysubstance use disorder, hepatits B and C, spl, who presented with complaints of abdominal pain and tightness and admitted for cirrhosis.   Overnight Events: no acute events overnight    Interm History: Patient seen and evaluated at bedside. Updated him on Liver MR findings and discussed with wife at bedside. Patient is tearful but appreciative.   OBJECTIVE:  Vital Signs: Vitals:   07/24/21 2332 07/24/21 2334 07/25/21 0311 07/25/21 0843  BP: 129/87  109/72 122/81  Pulse: 71  67 60  Resp: 18  17 19   Temp: 98.7 F (37.1 C)  98.6 F (37 C) 98.3 F (36.8 C)  TempSrc:   Oral Oral  SpO2: 100%  98% 96%  Weight:  64.5 kg    Height:       Supplemental O2: Room Air SpO2: 96 %  Filed Weights   07/24/21 1753 07/24/21 2334  Weight: 65.2 kg 64.5 kg     Intake/Output Summary (Last 24 hours) at 07/25/2021 1308 Last data filed at 07/24/2021 2334 Gross per 24 hour  Intake 240 ml  Output 450 ml  Net -210 ml   Net IO Since Admission: -210 mL [07/25/21 1308]  Physical Exam: Physical Exam Constitutional:      General: He is not in acute distress.    Appearance: He is well-developed and normal weight. He is not ill-appearing, toxic-appearing or diaphoretic.  HENT:     Head: Normocephalic and atraumatic.     Mouth/Throat:     Mouth: Mucous membranes are moist.  Cardiovascular:     Rate and Rhythm: Normal rate and regular rhythm.     Heart sounds: Normal heart sounds. No murmur heard.   No gallop.  Pulmonary:     Effort: Pulmonary effort is normal.     Breath sounds: Wheezing present.  Abdominal:     General: Abdomen is protuberant. A surgical scar is present. Bowel sounds are normal. There is distension.     Tenderness: There is generalized abdominal tenderness.  Neurological:     Mental Status: He is alert.    Patient Lines/Drains/Airways Status      Active Line/Drains/Airways     Name Placement date Placement time Site Days   Peripheral IV 07/23/21 20 G Anterior;Right Forearm 07/23/21  1530  Forearm  1   Incision (Closed) 06/30/19 Chest Left 06/30/19  1826  -- 755            Pertinent Labs: CBC Latest Ref Rng & Units 07/25/2021 07/24/2021 07/23/2021  WBC 4.0 - 10.5 K/uL 8.4 6.0 8.1  Hemoglobin 13.0 - 17.0 g/dL 13.4 12.9(L) 13.0  Hematocrit 39.0 - 52.0 % 36.5(L) 35.1(L) 35.2(L)  Platelets 150 - 400 K/uL 95(L) 77(L) 105(L)    CMP Latest Ref Rng & Units 07/25/2021 07/24/2021 07/23/2021  Glucose 70 - 99 mg/dL 105(H) 93 110(H)  BUN 6 - 20 mg/dL 9 7 6   Creatinine 0.61 - 1.24 mg/dL 0.51(L) 0.57(L) 0.51(L)  Sodium 135 - 145 mmol/L 133(L) 135 135  Potassium 3.5 - 5.1 mmol/L 4.6 4.2 3.9  Chloride 98 - 111 mmol/L 106 109 107  CO2 22 - 32 mmol/L 19(L) 20(L) 22  Calcium 8.9 - 10.3 mg/dL 8.4(L) 8.6(L) 8.7(L)  Total Protein 6.5 - 8.1 g/dL 6.7 7.0 7.6  Total Bilirubin 0.3 - 1.2 mg/dL 3.4(H) 3.6(H) 3.6(H)  Alkaline Phos 38 - 126 U/L 199(H) 167(H) 178(H)  AST 15 -  41 U/L 172(H) 183(H) 192(H)  ALT 0 - 44 U/L 50(H) 54(H) 58(H)    No results for input(s): GLUCAP in the last 72 hours.   Pertinent Imaging: MR LIVER W WO CONTRAST  Result Date: 07/24/2021 CLINICAL DATA:  A 51 year old male presents for evaluation of suspected hepatocellular carcinoma. EXAM: MRI ABDOMEN WITHOUT AND WITH CONTRAST TECHNIQUE: Multiplanar multisequence MR imaging of the abdomen was performed both before and after the administration of intravenous contrast. CONTRAST:  69mL GADAVIST GADOBUTROL 1 MMOL/ML IV SOLN COMPARISON:  Comparison is made with the CT evaluation of January of 2023. FINDINGS: Lower chest: Trace LEFT pleural effusion. Hepatobiliary: There is a large mass in the RIGHT hepatic lobe. Irregular infiltrative appearing enhancement tracks from the discrete mass into the adjacent liver and further into the IVC. This is well delineated on the CT of the abdomen and pelvis  that was acquired on January of 2023. The focal lesion as measured before measures 5.7 x 5.9 cm in approximate dimension on the MRI better delineated on the CT due to respiratory motion on the current exam. A cord of abnormal signal extends into the IVC, these findings are seen on image 9 of series 4. The lesion displays mosaic arterial phase enhancement and signs of washout though there is progressive enhancement that can be visualized within the lesion even on delayed imaging captured in the coronal plane. There is a capsule type appearance. This delayed enhancement can also be seen on the CT of July 23, 2021. Tumor thrombus extending into the IVC is visualized on image 33 of series 803 and on multiple others sequences as well as on the prior CT. No discrete additional lesion though the study is limited by some motion related artifact. Liver displays a cirrhotic morphology as noted on previous exams. The portal vein remains patent. Thrombus tracks into hepatic veins retrograde from the IVC due into middle and RIGHT hepatic veins. LEFT hepatic vein is patent. Thrombus in middle and RIGHT hepatic veins is likely bland in the setting of occlusion at the hepatic venous confluence secondary to over riding tumor thrombus that extends from the dominant lesion. The second lesion outlined on the prior CT is favored to represent direct extension of tumor from the dominant lesion though could also represent a satellite lesion but is not well evaluated on the current MRI No biliary duct dilation.  The gallbladder is decompressed. Pancreas: Pancreas with normal intrinsic T1 signal. No focal pancreatic lesion is visible, assessment limited. Spleen:  Spleen is surgically absent. Adrenals/Urinary Tract:  Adrenal glands and kidneys are normal. Stomach/Bowel: No acute gastrointestinal process to the extent evaluated on abdominal MRI with respiratory motion. Signs of esophageal and periesophageal varices. Vascular/Lymphatic:  Abdominal aorta is normal caliber. No adenopathy in the upper abdomen Other: Moderate volume ascites is diminished following paracentesis compared to previous imaging. Musculoskeletal: No suspicious bone lesions identified. IMPRESSION: Large RIGHT hepatic lobe mass greater than 5 cm with infiltrative tumor extending from the lesion across the liver into the inferior vena cava. Findings are compatible with hepatic neoplasm. 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 Satellite lesion versus direct tumoral extension with diffuse infiltrative process in the cephalad aspect of the liver again tracking into the IVC confluence. This is better seen on the previous CT. There is potentially a  second lesion as outlined above which may be lateral to the dominant lesion but is not well seen on the current study. In addition to tumor thrombus there is also suspected bland thrombus involving middle and RIGHT hepatic veins which are now occluded. Signs of cirrhosis and of portal hypertension as on the recent CT of the abdomen and pelvis. Diminished ascites following paracentesis. Electronically Signed   By: Zetta Bills M.D.   On: 07/24/2021 19:28    ASSESSMENT/PLAN:  Assessment: Principal Problem:   Decompensated hepatic cirrhosis (HCC) Active Problems:   Cirrhosis (HCC)   Antonio [name] is a 51 y.o. with pertinent PMH of cirrhosis, alcohol and polysubstance use disorder, hepatits B and C, spl, who presented with complaints of abdominal pain and tightness and admitted for cirrhosis on hospital day 1  Cirrhosis Alcohol use disorder H/o untreated Hepatitis B/C C/f Hepatocellular carcinoma His cirrhosis could be due to alcohol use, hepatitis C, or a combination of the two.  Hemodynamically stable. No  evidence of encephalopathy or bleeding at this time. MELD Na 19% (3-4% 90d mortality).  - Afebrile and normal WBC. Fluid analysis gram stain has been negative and shown no growth thus far. Will hold off on SBP prophylaxis at this time. - Diagnostic and therapeutic paracentesis yielded 2.6L yellow fluid. Fluid analysis now growth and TNC 36. Fluid albumin <1.5, unable to determine whether this fluid is transudative vs exudative. Cytology may help guide this further.  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 Macrovascular invasion was also noted. Treatment options appear to be limited. Oncology has recommended palliative consult.   - Pain likely due to ascites. Oxycodone 5 mg p.o. every 4 hours as needed for pain. 0.5mg  diladid q3hrs for severe pain.  -Continue Lasix 40 mg p.o. daily and spironolactone 100 mg p.o. daily -Propanolol 10 mg po BID -Lactulose 10 g po q2h -HIV, HCV Ab pending -Trend CBC, CMP  Alcohol use disorder Patient drinks 6-7 beers daily. Last drink was morning of 1/31.  Slight hand tremor on exam. No other neurologic findings at this time. Alert and oriented. No tachycardia currently.  - On CIWA protocol with ativan PRN.  -Thiamine and folic acid  Tobacco use - Reports he does not use any breathing medications.  - Audible wheeze on exam. Started on albuterol PRN. -Nicotine patch   Best Practice: Diet: Cardiac diet VTE: SCDs Start: 07/23/21 1838 Code: Full AB: none DISPO: Anticipated discharge to Home pending Medical stability.  Signature: Delene Ruffini, MD  Internal Medicine Resident, PGY-1 [facility]  Pager: [phone] 1:08 PM, 07/25/2021   Please contact the on call pager after 5 pm and on weekends at [phone].

## 2021-07-25 NOTE — Progress Notes (Signed)
Patient DOES NOT want any information given out to [name] regarding an update of his health situation. Antonio [name] has called asking for information about patient stating that she is his POA. Patient states that she is not.

## 2021-07-25 NOTE — Progress Notes (Signed)
Oncology Discharge Planning Admission Note  [facility] Address: [place], [place], [place] [address] Hours of Operation:  8am - 5pm, Monday - Friday  Clinic Contact Information:  [phone]  Oncology Care Team: Medical Oncologist:  Dr. Narda Rutherford  Dr. Lorenso Courier is aware of this hospital admission dated 07/24/21 and has assessed at the bedside. The cancer center will follow Antonio [name] inpatient care to assist with discharge planning as indicated by the oncologist.  We will arrange your follow up care closer to discharge.  Disclaimer:  This [place] note does not imply a formal consult request has been made by the admitting attending for this admission or there will be an inpatient consult completed by oncology.  Please request oncology consults as per standard process as indicated.

## 2021-07-25 NOTE — Consult Note (Signed)
[facility]  [phone] [phone]   MEDICAL ONCOLOGY - INITIAL CONSULTATION  Referral MD: Dr. Aldine Contes  Reason for Referral: Liver masses  HPI: [name] is a 51 year old male with a past medical history significant for cirrhosis, alcohol and polysubstance abuse, hepatitis B and [name], history of splenectomy about 17 years ago, and depression.  He presented to the emergency department with abdominal pain and tightness.  He has been experiencing abdominal pain since November 2022 which is progressively gotten worse.  The pain is diffuse and has been severe at times.  He also noticed that his abdomen was more swollen recently.  He has been able to eat and drink without any difficulty.  After he drinks alcohol, his abdominal pain worsens.  Labs on admission showed a platelet count of 105,000, creatinine 0.51, albumin 2.0, AST 192, ALT 58, alk phos 178, T bili 3.6, PT 18.6, INR 1.6.  The patient has a history of cirrhosis diagnosed at least 7 years ago.  Review of our EMR shows that he had a CT of the abdomen/pelvis with contrast performed on 06/26/2019 which showed a 3.3 cm mass in hepatic segment 4A with washout on the delayed phase concerning for hepatocellular carcinoma.  He also had an MRI of the abdomen with and without contrast performed on 07/06/2019 which again showed hepatic cirrhosis with a solitary lesion in segment 4A of the liver which has imaging characteristics most compatible with a large regenerative nodule and there were no definitive imaging features to strongly suggest [place], but correlation with AFP was recommended.  An AFP was drawn on 07/05/2019 and was normal at 8.3.  Follow-up imaging was recommended in 3 to 6 months.  However, the patient did not follow-up on imaging.    He had a CT of the abdomen/pelvis with contrast this admission performed on 07/23/2021 which showed cirrhosis with hepatic lesions with a dominant lesion in the region of  segment 4 and segment 8 has markedly enlarged in 2021.  There was evidence of tumor invasion into the hepatic veins with tumor thrombus in the IVC and findings are suggestive for multifocal HCC.  He also has ascites with esophageal varices and findings are compatible with portal hypertension.  He underwent an ultrasound-guided paracentesis on 07/24/2021 with 2.6 L of fluid removed.  Fluid has been sent for cytology.  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  There is a satellite lesion versus direct tumoral extension with diffuse infiltrative process in the cephalad aspect of the liver and potentially a second lesion which may be lateral to the dominant lesion but not well seen on the current study.  In addition to tumor thrombus, there is suspected bland thrombus involving middle and right hepatic veins which are now occluded.  An AFP was obtained on 07/24/2021 which was elevated at 59,629.  A CEA and CA 19.9 have been drawn and are pending.  I met with the patient and his wife in his hospital room today.  The palliative care NP was present during her visit.  The patient had just received pain medication just prior to my visit.  The patient has been having pain in his abdomen since at  least October 2022.  However, pain has becomes severe in the several days prior to admission.  Additionally, his wife noticed that his abdomen was becoming more and she thinks this occurred very rapidly.  His appetite has been diminished for the past week.  Unsure if he has lost weight.  Wife states that he has had some jaundice intermittently, particularly when he drinks a lot of alcohol.  He  currently denies headaches, dizziness, chest pain, shortness of breath.  He is not having any nausea or vomiting.  Bowels are moving.  He denies hematemesis, melena, hematochezia.  The patient is married.  He has a total of 7 children.  He currently smokes about a third of a pack of cigarettes per day.  He drinks at least 7 beers per day.  Denies family history of malignancy and liver disease.  Medical oncology was asked see the patient make recommendations regarding his abnormal imaging findings.  Past Medical History:  Diagnosis Date   Alcoholic ([place])    in recovery since 02/2018   Asthma    Complication of anesthesia    hard to wake up   Depression 09/17/2018   h/o suicidal ideation, previously homeless.   Hepatitis    hep b and [name]   History of ETOH abuse    Polysubstance overdose    Ruptured spleen    Septic arthritis of left ankle ([place]) 11/2016  :   Past Surgical History:  Procedure Laterality Date   INCISION AND DRAINAGE Left 11/27/2016   left ankle with hardware removal    IR PARACENTESIS  07/24/2021   NO PAST SURGERIES     spleen removed 5 yrs ago   ORIF ANKLE FRACTURE Left 10/06/2013   Procedure: LEFT ANKLE FRACTURE OPEN TREATMENT BILMALLEOLAR ANKLE INCLUDES INTERNAL FIXATION, LEFT ANKLE FRACTURE OPEN TREATMENT DISTAL TIBIOFIBULAR INCLUDES INTERNAL FIXATION ;  Surgeon: Renette Butters, MD;  Location: [facility];  Service: Orthopedics;  Laterality: Left;   ORIF ANKLE FRACTURE Right 09/21/2018   Procedure: Open reduction internal fixation right trimalleolar ankle fracture;  Surgeon: Wylene Simmer, MD;  Location: [facility];  Service: Orthopedics;  Laterality: Right;  80mn  ok per OR desk to follow in Room 5   spllenectomy    :   Current Facility-Administered Medications  Medication Dose Route Frequency Provider Last Rate Last Admin   albuterol (PROVENTIL) (2.5 MG/3ML) 0.083% nebulizer solution 2.5 mg  2.5 mg Nebulization Q4H PRN CRick Duff MD       folic acid (FOLVITE) tablet 1 mg  1 mg Oral Daily CRick Duff MD   1 mg at 07/25/21 1003   furosemide (LASIX) tablet 40 mg  40 mg Oral Daily CRick Duff MD   40 mg at 07/25/21 1003   HYDROmorphone (DILAUDID) injection 0.5 mg  0.5 mg Intravenous Q3H PRN CRick Duff MD   0.5 mg at 07/25/21 0829   LORazepam (ATIVAN) tablet 1-4 mg  1-4 mg Oral Q1H PRN CRick Duff MD       Or   LORazepam (ATIVAN) injection 1-4 mg  1-4 mg Intravenous Q1H PRN CRick Duff MD       multivitamin with minerals tablet 1 tablet  1 tablet Oral Daily CRick Duff MD   1 tablet at 07/25/21 1003   nicotine (NICODERM CQ - dosed in mg/24 hours) patch 21 mg  21 mg Transdermal QHS CRick Duff MD   21 mg at 07/24/21 2005   oxyCODONE (Oxy IR/ROXICODONE) immediate release tablet  5 mg  5 mg Oral Q4H PRN Rick Duff, MD   5 mg at 07/23/21 2313   propranolol (INDERAL) tablet 10 mg  10 mg Oral BID Rick Duff, MD   10 mg at 07/25/21 1003   spironolactone (ALDACTONE) tablet 100 mg  100 mg Oral Daily Rick Duff, MD   100 mg at 07/25/21 1003   thiamine tablet 100 mg  100 mg Oral Daily Rick Duff, MD   100 mg at 07/25/21 1004      Allergies  Allergen Reactions   Fish Allergy Itching and Swelling   Sulfa Antibiotics Swelling   Bee Venom Swelling   Haloperidol Other (See Comments)    EPS: DYSTONIA - SEVERE   Latex   :   Family History  Problem Relation Age of Onset   Hepatitis Mother    Alcohol abuse Brother   :   Social History   Socioeconomic History   Marital status: Single    Spouse name: Not on file   Number of children: Not on file   Years of education: Not on file   Highest education level: Not on file  Occupational History   Not on file  Tobacco Use   Smoking status: Former    Packs/day: 1.00    Years: 15.00    Pack years: 15.00    Types: Cigarettes   Smokeless tobacco: Never  Vaping Use   Vaping Use: Never used   Substance and Sexual Activity   Alcohol use: Not Currently    Comment: hx of abuse-in recovery   Drug use: Not Currently    Frequency: 28.0 times per week    Types: Hydrocodone, Heroin    Comment: hx of abuse-in recovery   Sexual activity: Not on file  Other Topics Concern   Not on file  Social History Narrative   Not on file   Social Determinants of Health   Financial Resource Strain: Not on file  Food Insecurity: Not on file  Transportation Needs: Not on file  Physical Activity: Not on file  Stress: Not on file  Social Connections: Not on file  Intimate Partner Violence: Not on file  :  Review of Systems: A comprehensive 14 point review of systems was negative except as noted in the HPI.  Exam: Patient Vitals for the past 24 hrs:  BP Temp Temp src Pulse Resp SpO2 Height Weight  07/25/21 0843 122/81 98.3 F (36.8 [name]) Oral 60 19 96 % -- --  07/25/21 0311 109/72 98.6 F (37 [name]) Oral 67 17 98 % -- --  07/24/21 2334 -- -- -- -- -- -- -- 64.5 kg  07/24/21 2332 129/87 98.7 F (37.1 [name]) -- 71 18 100 % -- --  07/24/21 1928 (!) 132/105 98.6 F (37 [name]) Oral 64 18 97 % -- --  07/24/21 1753 (!) 123/91 98.2 F (36.8 [name]) Oral 67 18 -- 5' 5"  (1.651 m) 65.2 kg  07/24/21 1518 -- -- -- -- -- -- 5' 5"  (1.651 m) --  07/24/21 1436 (!) 123/91 98.7 F (37.1 [name]) Oral 67 18 98 % -- --  07/24/21 1328 127/68 -- -- 70 -- -- -- --  07/24/21 1326 127/68 98.2 F (36.8 [name]) Oral 70 18 97 % -- --    General: Alert, cachectic Eyes: Scleral icterus present ENT:  There were no oropharyngeal lesions.    Lymphatics:  Negative cervical, supraclavicular or axillary adenopathy.   Respiratory: lungs were clear bilaterally without wheezing or crackles.   Cardiovascular:  Regular rate and rhythm, S1/S2, without murmur, rub or gallop.  There was no pedal edema.   GI: Positive bowel sounds, tenderness with palpation throughout the abdomen, ascites present  Skin exam was without echymosis, petichae.   Neuro exam  was nonfocal. Patient was alert and oriented.      Lab Results  Component Value Date   WBC 8.4 07/25/2021   HGB 13.4 07/25/2021   HCT 36.5 (L) 07/25/2021   PLT 95 (L) 07/25/2021   GLUCOSE 105 (H) 07/25/2021   CHOL 155 02/13/2016   TRIG 58 02/13/2016   HDL 32 (L) 02/13/2016   LDLCALC 111 (H) 02/13/2016   ALT 50 (H) 07/25/2021   AST 172 (H) 07/25/2021   NA 133 (L) 07/25/2021   K 4.6 07/25/2021   CL 106 07/25/2021   CREATININE 0.51 (L) 07/25/2021   BUN 9 07/25/2021   CO2 19 (L) 07/25/2021    CT Abdomen Pelvis W Contrast  Result Date: 07/23/2021 CLINICAL DATA:  Ascites.  Alcoholic liver disease. EXAM: CT ABDOMEN AND PELVIS WITH CONTRAST TECHNIQUE: Multidetector CT imaging of the abdomen and pelvis was performed using the standard protocol following bolus administration of intravenous contrast. RADIATION DOSE REDUCTION: This exam was performed according to the departmental dose-optimization program which includes automated exposure control, adjustment of the mA and/or kV according to patient size and/or use of iterative reconstruction technique. CONTRAST:  161m OMNIPAQUE IOHEXOL 300 MG/ML  SOLN COMPARISON:  Abdominal MRI 07/06/2019 and CT 02/12/2016 FINDINGS: Lower chest: Calcified granuloma in the left lower lobe on sequence 5 image 9 measures 5 mm. Hazy densities at the posterior lung bases are suggestive for atelectasis. No pleural effusions. Hepatobiliary: Liver is diffusely heterogeneous and nodular. Findings are compatible with cirrhosis. There is a dominant hepatic lesion situated between segment 4A and segment 8 that appears to have some peripheral enhancement. This lesion corresponds with the previously seen lesion from 2021. This lesion has markedly enlarged in size measuring 5.6 x 5.7 x 6.2 cm and previously this lesion measured up to 3.3 cm. In addition, there is a new lesion at the hepatic dome on sequence 2 image 8 that is better characterized on the sagittal images on sequence 7,  image 29 measuring up to 2.4 cm. There appears to be tumor extending from the lesions toward the IVC and extending along the hepatic veins. There is evidence for tumor thrombus in the IVC on sequence 3, image 11 that measures 3.0 x 2.4 x 2.7 cm. Difficult to exclude additional small hepatic lesions. Normal appearance of the gallbladder. No significant biliary dilatation. Pancreas: Unremarkable. No pancreatic ductal dilatation or surrounding inflammatory changes. Spleen: Splenectomy. Adrenals/Urinary Tract: Normal adrenal glands. Normal appearance of both kidneys without hydronephrosis. Normal appearance of the urinary bladder. Stomach/Bowel: Normal appendix. No evidence for bowel dilatation or focal bowel inflammation. Normal appearance of the stomach. Vascular/Lymphatic: Large filling defect in the upper IVC near the hepatic vein drainage compatible with tumor thrombus. Limited evaluation of the hepatic veins on this examination. Main portal venous system is patent on the delayed images. Evidence for esophageal varices. Normal caliber of the abdominal aorta with mild atherosclerotic disease. Main visceral arteries are patent. Mildly prominent inguinal lymph nodes bilaterally. No significant lymph node enlargement in the abdomen or pelvis. Reproductive: Prostate is unremarkable. Other: Moderate amount of ascites in the abdomen and pelvis. Stranding and prominent vessels along the omentum are likely related to the cirrhosis and ascites. Negative for free air. Musculoskeletal: 8 mm focal lucency in the  right iliac wing on sequence 3 image 59 appears chronic. There is also stable target shaped area of sclerosis in the right iliac bone image 59. Schmorl's nodes and subchondral sclerosis in lower lumbar spine. No suspicious bone lesions. IMPRESSION: 1. Cirrhosis with hepatic lesions. Dominant lesion in the region of segment 4 and segment 8 has markedly enlarged since 2021. There is evidence for tumor invasion into the  hepatic veins with tumor thrombus in the IVC as described. 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 2. Ascites with esophageal varices. Findings are compatible with portal hypertension. 3. Splenectomy. Electronically Signed   By: Markus Daft M.D.   On: 07/23/2021 16:23   MR LIVER W WO CONTRAST  Result Date: 07/24/2021 CLINICAL DATA:  A 51 year old male presents for evaluation of suspected hepatocellular carcinoma. EXAM: MRI ABDOMEN WITHOUT AND WITH CONTRAST TECHNIQUE: Multiplanar multisequence MR imaging of the abdomen was performed both before and after the administration of intravenous contrast. CONTRAST:  38m GADAVIST GADOBUTROL 1 MMOL/ML IV SOLN COMPARISON:  Comparison is made with the CT evaluation of January of 2023. FINDINGS: Lower chest: Trace LEFT pleural effusion. Hepatobiliary: There is a large mass in the RIGHT hepatic lobe. Irregular infiltrative appearing enhancement tracks from the discrete mass into the adjacent liver and further into the IVC. This is well delineated on the CT of the abdomen and pelvis that was acquired on January of 2023. The focal lesion as measured before measures 5.7 x 5.9 cm in approximate dimension on the MRI better delineated on the CT due to respiratory motion on the current exam. A cord of abnormal signal extends into the IVC, these findings are seen on image 9 of series 4. The lesion displays mosaic arterial phase enhancement and signs of washout though there is progressive enhancement that can be visualized within the lesion even on delayed imaging captured in the coronal plane. There is a capsule type appearance. This delayed enhancement can also be seen on the CT of July 23, 2021. Tumor thrombus extending into the IVC is visualized on image 33 of series 803 and on multiple others sequences as well as on the prior CT. No discrete additional lesion though the study is limited by some motion related artifact. Liver displays a cirrhotic  morphology as noted on previous exams. The portal vein remains patent. Thrombus tracks into hepatic veins retrograde from the IVC due into middle and RIGHT hepatic veins. LEFT hepatic vein is patent. Thrombus in middle and RIGHT hepatic veins is likely bland in the setting of occlusion at the hepatic venous confluence secondary to over riding tumor thrombus that extends from the dominant lesion. The second lesion outlined on the prior CT is favored to represent direct extension of tumor from the dominant lesion though could also represent a satellite lesion but is not well evaluated on the current MRI No biliary duct dilation.  The gallbladder is decompressed. Pancreas: Pancreas with normal intrinsic T1 signal. No focal pancreatic lesion is visible, assessment limited. Spleen:  Spleen is surgically absent. Adrenals/Urinary Tract:  Adrenal glands and kidneys are normal. Stomach/Bowel: No acute gastrointestinal process to the extent evaluated on abdominal MRI with respiratory motion. Signs of esophageal and periesophageal varices. Vascular/Lymphatic: Abdominal aorta is normal caliber. No adenopathy in the upper abdomen Other: Moderate volume ascites is diminished following paracentesis compared to previous imaging. Musculoskeletal: No suspicious bone lesions identified. IMPRESSION: Large RIGHT hepatic lobe mass greater than 5 cm with infiltrative tumor extending from the lesion across the liver into the inferior  vena cava. Findings are compatible with hepatic neoplasm. 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 Satellite lesion versus direct tumoral extension with diffuse infiltrative process in the cephalad aspect of the liver again tracking into the IVC confluence. This is better seen on the previous CT.  There is potentially a second lesion as outlined above which may be lateral to the dominant lesion but is not well seen on the current study. In addition to tumor thrombus there is also suspected bland thrombus involving middle and RIGHT hepatic veins which are now occluded. Signs of cirrhosis and of portal hypertension as on the recent CT of the abdomen and pelvis. Diminished ascites following paracentesis. Electronically Signed   By: Zetta Bills M.D.   On: 07/24/2021 19:28   IR Paracentesis  Result Date: 07/24/2021 INDICATION: Patient with history of alcohol abuse and hepatic disease with ascites. Request for diagnostic and therapeutic paracentesis. EXAM: ULTRASOUND GUIDED DIAGNOSTIC AND THERAPEUTIC RIGHT LOWER QUADRANT PARACENTESIS MEDICATIONS: 10 mL 1 % lidocaine COMPLICATIONS: None immediate. PROCEDURE: Informed written consent was obtained from the patient after a discussion of the risks, benefits and alternatives to treatment. A timeout was performed prior to the initiation of the procedure. Initial ultrasound scanning demonstrates a large amount of ascites within the right lower abdominal quadrant. The right lower abdomen was prepped and draped in the usual sterile fashion. 1% lidocaine was used for local anesthesia. Following this, a 19 gauge, 7-cm, Yueh catheter was introduced. An ultrasound image was saved for documentation purposes. The paracentesis was performed. The catheter was removed and a dressing was applied. The patient tolerated the procedure well without immediate post procedural complication. FINDINGS: A total of approximately 2.6 L of clear, yellow fluid was removed. Samples were sent to the laboratory as requested by the clinical team. IMPRESSION: Successful ultrasound-guided paracentesis yielding 2.6 liters of peritoneal fluid. Read by: Narda Rutherford, AGNP-BC Electronically Signed   By: Ruthann Cancer M.D.   On: 07/24/2021 11:35     CT Abdomen Pelvis W Contrast  Result Date:  07/23/2021 CLINICAL DATA:  Ascites.  Alcoholic liver disease. EXAM: CT ABDOMEN AND PELVIS WITH CONTRAST TECHNIQUE: Multidetector CT imaging of the abdomen and pelvis was performed using the standard protocol following bolus administration of intravenous contrast. RADIATION DOSE REDUCTION: This exam was performed according to the departmental dose-optimization program which includes automated exposure control, adjustment of the mA and/or kV according to patient size and/or use of iterative reconstruction technique. CONTRAST:  150m OMNIPAQUE IOHEXOL 300 MG/ML  SOLN COMPARISON:  Abdominal MRI 07/06/2019 and CT 02/12/2016 FINDINGS: Lower chest: Calcified granuloma in the left lower lobe on sequence 5 image 9 measures 5 mm. Hazy densities at the posterior lung bases are suggestive for atelectasis. No pleural effusions. Hepatobiliary: Liver is diffusely heterogeneous and nodular. Findings are compatible with cirrhosis. There is a dominant hepatic lesion situated between segment 4A and segment 8 that appears to have some peripheral enhancement. This lesion corresponds with the previously seen lesion from 2021. This lesion has markedly enlarged in size measuring 5.6 x 5.7 x 6.2 cm and previously this lesion measured up to 3.3 cm. In addition, there is a new lesion at the hepatic dome on sequence 2 image 8 that is better characterized on the sagittal images on sequence 7, image 29 measuring up to  2.4 cm. There appears to be tumor extending from the lesions toward the IVC and extending along the hepatic veins. There is evidence for tumor thrombus in the IVC on sequence 3, image 11 that measures 3.0 x 2.4 x 2.7 cm. Difficult to exclude additional small hepatic lesions. Normal appearance of the gallbladder. No significant biliary dilatation. Pancreas: Unremarkable. No pancreatic ductal dilatation or surrounding inflammatory changes. Spleen: Splenectomy. Adrenals/Urinary Tract: Normal adrenal glands. Normal appearance of both  kidneys without hydronephrosis. Normal appearance of the urinary bladder. Stomach/Bowel: Normal appendix. No evidence for bowel dilatation or focal bowel inflammation. Normal appearance of the stomach. Vascular/Lymphatic: Large filling defect in the upper IVC near the hepatic vein drainage compatible with tumor thrombus. Limited evaluation of the hepatic veins on this examination. Main portal venous system is patent on the delayed images. Evidence for esophageal varices. Normal caliber of the abdominal aorta with mild atherosclerotic disease. Main visceral arteries are patent. Mildly prominent inguinal lymph nodes bilaterally. No significant lymph node enlargement in the abdomen or pelvis. Reproductive: Prostate is unremarkable. Other: Moderate amount of ascites in the abdomen and pelvis. Stranding and prominent vessels along the omentum are likely related to the cirrhosis and ascites. Negative for free air. Musculoskeletal: 8 mm focal lucency in the right iliac wing on sequence 3 image 59 appears chronic. There is also stable target shaped area of sclerosis in the right iliac bone image 59. Schmorl's nodes and subchondral sclerosis in lower lumbar spine. No suspicious bone lesions. IMPRESSION: 1. Cirrhosis with hepatic lesions. Dominant lesion in the region of segment 4 and segment 8 has markedly enlarged since 2021. There is evidence for tumor invasion into the hepatic veins with tumor thrombus in the IVC as described. 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 2. Ascites with esophageal varices. Findings are compatible with portal hypertension. 3. Splenectomy. Electronically Signed   By: Markus Daft M.D.   On: 07/23/2021 16:23   MR LIVER W WO CONTRAST  Result Date: 07/24/2021 CLINICAL DATA:  A 51 year old male presents for evaluation of suspected hepatocellular carcinoma. EXAM: MRI ABDOMEN WITHOUT AND WITH CONTRAST TECHNIQUE: Multiplanar multisequence MR imaging of the abdomen was  performed both before and after the administration of intravenous contrast. CONTRAST:  89m GADAVIST GADOBUTROL 1 MMOL/ML IV SOLN COMPARISON:  Comparison is made with the CT evaluation of January of 2023. FINDINGS: Lower chest: Trace LEFT pleural effusion. Hepatobiliary: There is a large mass in the RIGHT hepatic lobe. Irregular infiltrative appearing enhancement tracks from the discrete mass into the adjacent liver and further into the IVC. This is well delineated on the CT of the abdomen and pelvis that was acquired on January of 2023. The focal lesion as measured before measures 5.7 x 5.9 cm in approximate dimension on the MRI better delineated on the CT due to respiratory motion on the current exam. A cord of abnormal signal extends into the IVC, these findings are seen on image 9 of series 4. The lesion displays mosaic arterial phase enhancement and signs of washout though there is progressive enhancement that can be visualized within the lesion even on delayed imaging captured in the coronal plane. There is a capsule type appearance. This delayed enhancement can also be seen on the CT of July 23, 2021. Tumor thrombus extending into the IVC is visualized on image 33 of series 803 and on multiple others sequences as well as on the prior CT. No discrete additional lesion though the study is limited by some motion related artifact. Liver  displays a cirrhotic morphology as noted on previous exams. The portal vein remains patent. Thrombus tracks into hepatic veins retrograde from the IVC due into middle and RIGHT hepatic veins. LEFT hepatic vein is patent. Thrombus in middle and RIGHT hepatic veins is likely bland in the setting of occlusion at the hepatic venous confluence secondary to over riding tumor thrombus that extends from the dominant lesion. The second lesion outlined on the prior CT is favored to represent direct extension of tumor from the dominant lesion though could also represent a satellite lesion  but is not well evaluated on the current MRI No biliary duct dilation.  The gallbladder is decompressed. Pancreas: Pancreas with normal intrinsic T1 signal. No focal pancreatic lesion is visible, assessment limited. Spleen:  Spleen is surgically absent. Adrenals/Urinary Tract:  Adrenal glands and kidneys are normal. Stomach/Bowel: No acute gastrointestinal process to the extent evaluated on abdominal MRI with respiratory motion. Signs of esophageal and periesophageal varices. Vascular/Lymphatic: Abdominal aorta is normal caliber. No adenopathy in the upper abdomen Other: Moderate volume ascites is diminished following paracentesis compared to previous imaging. Musculoskeletal: No suspicious bone lesions identified. IMPRESSION: Large RIGHT hepatic lobe mass greater than 5 cm with infiltrative tumor extending from the lesion across the liver into the inferior vena cava. Findings are compatible with hepatic neoplasm. 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 Satellite lesion versus direct tumoral extension with diffuse infiltrative process in the cephalad aspect of the liver again tracking into the IVC confluence. This is better seen on the previous CT. There is potentially a second lesion as outlined above which may be lateral to the dominant lesion but is not well seen on the current study. In addition to tumor thrombus there is also suspected bland thrombus involving middle and RIGHT hepatic veins which are now occluded. Signs of cirrhosis and of portal hypertension as on the recent CT of the abdomen and pelvis. Diminished ascites following paracentesis. Electronically Signed   By: Zetta Bills M.D.   On: 07/24/2021 19:28   IR Paracentesis  Result Date: 07/24/2021 INDICATION: Patient with history of  alcohol abuse and hepatic disease with ascites. Request for diagnostic and therapeutic paracentesis. EXAM: ULTRASOUND GUIDED DIAGNOSTIC AND THERAPEUTIC RIGHT LOWER QUADRANT PARACENTESIS MEDICATIONS: 10 mL 1 % lidocaine COMPLICATIONS: None immediate. PROCEDURE: Informed written consent was obtained from the patient after a discussion of the risks, benefits and alternatives to treatment. A timeout was performed prior to the initiation of the procedure. Initial ultrasound scanning demonstrates a large amount of ascites within the right lower abdominal quadrant. The right lower abdomen was prepped and draped in the usual sterile fashion. 1% lidocaine was used for local anesthesia. Following this, a 19 gauge, 7-cm, Yueh catheter was introduced. An ultrasound image was saved for documentation purposes. The paracentesis was performed. The catheter was removed and a dressing was applied. The patient tolerated the procedure well without immediate post procedural complication. FINDINGS: A total of approximately 2.6 L of clear, yellow fluid was removed. Samples were sent to the laboratory as requested by the clinical team. IMPRESSION: Successful ultrasound-guided paracentesis yielding 2.6 liters of peritoneal fluid. Read by: Narda Rutherford, AGNP-BC Electronically Signed   By: Ruthann Cancer M.D.   On: 07/24/2021 11:35    Assessment and Plan:  This is a 51 year old male with  a history of cirrhosis, alcohol and polysubstance abuse, hepatitis B and [name], and prior splenectomy who presented to the hospital with worsening abdominal pain and abdominal distention.  Imaging findings show multifocal hepatic masses in the setting of cirrhosis. Findings are LI-RADS category M. AFP was significantly elevated this admission.  CEA and CA 19.9 pending.  Review of prior imaging indicate that he has had at least a solitary lesion in his liver since at least January 2021.  # Multifocal Hepatic Masses in Setting of Cirrhosis # Cirrhosis 2/2  Hepatitis B, [name], and EtOH Abuse # Decompensated Cirrhosis --findings on MRI are LI-RADS-category M. This is non-definitive for hepatocellular carcinoma.  -- AFP performed on 07/24/2021 was elevated at 59,629.  CEA and CA 19.9 are pending.   -- Discussed proceeding with a liver biopsy with the patient and his wife to confirm the diagnosis.  However, given that he is a poor candidate for systemic chemotherapy, he is inclined not to proceed with the biopsy at this time.  He will continue ongoing discussions with his wife and palliative care regarding proceeding versus foregoing biopsy. --patient is Antonio [name] with Hepatitis B, [name] and ongoing ETOH abuse. He is a very poor candidate for systemic chemotherapy --patient is not a candidate for liver transplant as the largest mass is >5 cm and there is marcrovascular invasion.  -- Palliative care has been consulted and are initiating discussions with the patient.  We would recommend consideration of hospice.   Thank you for this referral.   Mikey Bussing, DNP, AGPCNP-BC, AOCNP

## 2021-07-26 ENCOUNTER — Other Ambulatory Visit (HOSPITAL_COMMUNITY): Payer: Self-pay

## 2021-07-26 ENCOUNTER — Inpatient Hospital Stay (HOSPITAL_COMMUNITY): Payer: Self-pay

## 2021-07-26 DIAGNOSIS — K7031 Alcoholic cirrhosis of liver with ascites: Principal | ICD-10-CM

## 2021-07-26 HISTORY — PX: IR PERC TUN PERIT CATH WO PORT S&I /IMAG: IMG2327

## 2021-07-26 LAB — COMPREHENSIVE METABOLIC PANEL
ALT: 52 U/L — ABNORMAL HIGH (ref 0–44)
AST: 170 U/L — ABNORMAL HIGH (ref 15–41)
Albumin: 1.7 g/dL — ABNORMAL LOW (ref 3.5–5.0)
Alkaline Phosphatase: 179 U/L — ABNORMAL HIGH (ref 38–126)
Anion gap: 5 (ref 5–15)
BUN: 13 mg/dL (ref 6–20)
CO2: 23 mmol/L (ref 22–32)
Calcium: 8.5 mg/dL — ABNORMAL LOW (ref 8.9–10.3)
Chloride: 105 mmol/L (ref 98–111)
Creatinine, Ser: 0.76 mg/dL (ref 0.61–1.24)
GFR, Estimated: >60 mL/min (ref >60)
Glucose, Bld: 112 mg/dL — ABNORMAL HIGH (ref 70–99)
Potassium: 4.7 mmol/L (ref 3.5–5.1)
Sodium: 133 mmol/L — ABNORMAL LOW (ref 135–145)
Total Bilirubin: 3.9 mg/dL — ABNORMAL HIGH (ref 0.3–1.2)
Total Protein: 7.8 g/dL (ref 6.5–8.1)

## 2021-07-26 LAB — CBC WITH DIFFERENTIAL/PLATELET
Abs Immature Granulocytes: 0.37 10*3/uL — ABNORMAL HIGH (ref 0.00–0.07)
Basophils Absolute: 0.1 10*3/uL (ref 0.0–0.1)
Basophils Relative: 1 %
Eosinophils Absolute: 0.2 10*3/uL (ref 0.0–0.5)
Eosinophils Relative: 2 %
HCT: 39.8 % (ref 39.0–52.0)
Hemoglobin: 14.5 g/dL (ref 13.0–17.0)
Immature Granulocytes: 4 %
Lymphocytes Relative: 28 %
Lymphs Abs: 2.6 10*3/uL (ref 0.7–4.0)
MCH: 31.9 pg (ref 26.0–34.0)
MCHC: 36.4 g/dL — ABNORMAL HIGH (ref 30.0–36.0)
MCV: 87.5 fL (ref 80.0–100.0)
Monocytes Absolute: 1.6 10*3/uL — ABNORMAL HIGH (ref 0.1–1.0)
Monocytes Relative: 17 %
Neutro Abs: 4.6 10*3/uL (ref 1.7–7.7)
Neutrophils Relative %: 48 %
Platelets: 105 10*3/uL — ABNORMAL LOW (ref 150–400)
RBC: 4.55 MIL/uL (ref 4.22–5.81)
RDW: 20 % — ABNORMAL HIGH (ref 11.5–15.5)
WBC: 9.4 10*3/uL (ref 4.0–10.5)
nRBC: 0 % (ref 0.0–0.2)

## 2021-07-26 LAB — HCV RNA QUANT
HCV Quantitative Log: 5.963 log10 IU/mL (ref >1.70)
HCV Quantitative: 918000 IU/mL (ref >50)

## 2021-07-26 LAB — CANCER ANTIGEN 19-9: CA 19-9: 333 U/mL — ABNORMAL HIGH (ref 0–35)

## 2021-07-26 LAB — CEA: CEA: 7.5 ng/mL — ABNORMAL HIGH (ref 0.0–4.7)

## 2021-07-26 LAB — CYTOLOGY - NON PAP

## 2021-07-26 MED ORDER — OXYCODONE HCL ER 10 MG PO T12A
10.0000 mg | EXTENDED_RELEASE_TABLET | Freq: Two times a day (BID) | ORAL | 0 refills | Status: DC
  Filled 2021-07-26: qty 14, 7d supply, fill #0

## 2021-07-26 MED ORDER — FENTANYL CITRATE (PF) 100 MCG/2ML IJ SOLN
INTRAMUSCULAR | Status: AC
  Filled 2021-07-26: qty 4

## 2021-07-26 MED ORDER — FENTANYL CITRATE (PF) 100 MCG/2ML IJ SOLN
INTRAMUSCULAR | Status: AC | PRN
  Administered 2021-07-26 (×2): 50 ug via INTRAVENOUS

## 2021-07-26 MED ORDER — THIAMINE HCL 100 MG PO TABS
100.0000 mg | ORAL_TABLET | Freq: Every day | ORAL | 0 refills | Status: DC
Start: 2021-07-27 — End: 2021-07-30
  Filled 2021-07-26 – 2021-07-29 (×2): qty 30, 30d supply, fill #0

## 2021-07-26 MED ORDER — SPIRONOLACTONE 100 MG PO TABS
100.0000 mg | ORAL_TABLET | Freq: Every day | ORAL | 1 refills | Status: DC
  Filled 2021-07-26 – 2021-07-29 (×2): qty 30, 30d supply, fill #0

## 2021-07-26 MED ORDER — CEFAZOLIN SODIUM-DEXTROSE 1-4 GM/50ML-% IV SOLN
INTRAVENOUS | Status: AC | PRN
  Administered 2021-07-26: 2 g via INTRAVENOUS

## 2021-07-26 MED ORDER — PROPRANOLOL HCL 10 MG PO TABS
10.0000 mg | ORAL_TABLET | Freq: Two times a day (BID) | ORAL | 0 refills | Status: DC
  Filled 2021-07-26 – 2021-07-29 (×2): qty 60, 30d supply, fill #0

## 2021-07-26 MED ORDER — MIDAZOLAM HCL 2 MG/2ML IJ SOLN
INTRAMUSCULAR | Status: AC
  Filled 2021-07-26: qty 4

## 2021-07-26 MED ORDER — LIDOCAINE HCL 1 % IJ SOLN
INTRAMUSCULAR | Status: AC
  Filled 2021-07-26: qty 20

## 2021-07-26 MED ORDER — SENNA 8.6 MG PO TABS
1.0000 | ORAL_TABLET | Freq: Every day | ORAL | 0 refills | Status: DC
  Filled 2021-07-26: qty 30, 30d supply, fill #0
  Filled 2021-07-29: qty 120, 120d supply, fill #0

## 2021-07-26 MED ORDER — FUROSEMIDE 40 MG PO TABS
40.0000 mg | ORAL_TABLET | Freq: Every day | ORAL | 1 refills | Status: DC
  Filled 2021-07-26 – 2021-07-29 (×2): qty 30, 30d supply, fill #0

## 2021-07-26 MED ORDER — OXYCODONE HCL 5 MG/5ML PO SOLN
10.0000 mg | ORAL | 0 refills | Status: DC | PRN
  Filled 2021-07-26 (×2): qty 60, 1d supply, fill #0

## 2021-07-26 MED ORDER — CEFAZOLIN SODIUM-DEXTROSE 2-4 GM/100ML-% IV SOLN
INTRAVENOUS | Status: AC
  Filled 2021-07-26: qty 100

## 2021-07-26 MED ORDER — CERTAVITE/ANTIOXIDANTS PO TABS
1.0000 | ORAL_TABLET | Freq: Every day | ORAL | 0 refills | Status: DC
  Filled 2021-07-26 – 2021-07-29 (×2): qty 30, 30d supply, fill #0

## 2021-07-26 MED ORDER — NICOTINE 21 MG/24HR TD PT24
21.0000 mg | MEDICATED_PATCH | Freq: Every day | TRANSDERMAL | 0 refills | Status: DC
  Filled 2021-07-26: qty 28, 28d supply, fill #0

## 2021-07-26 MED ORDER — MIDAZOLAM HCL 2 MG/2ML IJ SOLN
INTRAMUSCULAR | Status: AC | PRN
  Administered 2021-07-26 (×2): 1 mg via INTRAVENOUS

## 2021-07-26 MED ORDER — ALBUTEROL SULFATE (2.5 MG/3ML) 0.083% IN NEBU
2.5000 mg | INHALATION_SOLUTION | RESPIRATORY_TRACT | 12 refills | Status: DC | PRN
  Filled 2021-07-26 – 2021-07-29 (×2): qty 90, 5d supply, fill #0

## 2021-07-26 MED ORDER — LORAZEPAM 1 MG PO TABS
1.0000 mg | ORAL_TABLET | ORAL | 0 refills | Status: DC | PRN
  Filled 2021-07-26: qty 30, 3d supply, fill #0

## 2021-07-26 MED ORDER — FOLIC ACID 1 MG PO TABS
1.0000 mg | ORAL_TABLET | Freq: Every day | ORAL | 1 refills | Status: DC
  Filled 2021-07-26 – 2021-07-29 (×2): qty 30, 30d supply, fill #0

## 2021-07-26 NOTE — Progress Notes (Addendum)
HD#2 SUBJECTIVE:  Patient Summary: Antonio [name] is a 51 y.o. with a pertinent PMH of cirrhosis, alcohol and polysubstance use disorder, hepatits B and C, spl, who presented with complaints of abdominal pain and tightness and admitted for cirrhosis.   Overnight Events: no acute events overnight    Interm History: Patient seen and evaluated at bedside. Updated him on Liver MR findings and discussed with wife at bedside. Patient is tearful but appreciative.   OBJECTIVE:  Vital Signs: Vitals:   07/25/21 2000 07/26/21 0319 07/26/21 0820 07/26/21 1009  BP: 119/78 90/63 94/60  112/65  Pulse: (!) 59 68 65 69  Resp: 18 17 16 18   Temp: 98 F (36.7 C) 98.2 F (36.8 C) 98.2 F (36.8 C) 98.2 F (36.8 C)  TempSrc: Oral Oral Oral Oral  SpO2: 100% 99% 98% 98%  Weight:      Height:       Supplemental O2: Room Air SpO2: 98 %  Filed Weights   07/24/21 1753 07/24/21 2334  Weight: 65.2 kg 64.5 kg     Intake/Output Summary (Last 24 hours) at 07/26/2021 1311 Last data filed at 07/26/2021 0900 Gross per 24 hour  Intake 390 ml  Output --  Net 390 ml   Net IO Since Admission: 580 mL [07/26/21 1311]  Physical Exam: Physical Exam Constitutional:      General: He is not in acute distress.    Appearance: He is well-developed and normal weight. He is not ill-appearing, toxic-appearing or diaphoretic.  HENT:     Head: Normocephalic and atraumatic.     Mouth/Throat:     Mouth: Mucous membranes are moist.  Cardiovascular:     Rate and Rhythm: Normal rate and regular rhythm.     Heart sounds: Normal heart sounds. No murmur heard.   No gallop.  Pulmonary:     Effort: Pulmonary effort is normal.     Breath sounds: Wheezing present.  Abdominal:     General: Abdomen is protuberant. A surgical scar is present. Bowel sounds are normal. There is distension.     Tenderness: There is generalized abdominal tenderness.     Hernia: A hernia is present. Hernia is present in the ventral area.   Skin:    General: Skin is warm and dry.  Neurological:     General: No focal deficit present.     Mental Status: He is alert.  Psychiatric:        Mood and Affect: Mood normal.        Behavior: Behavior normal.    Patient Lines/Drains/Airways Status     Active Line/Drains/Airways     Name Placement date Placement time Site Days   Peripheral IV 07/23/21 20 G Anterior;Right Forearm 07/23/21  1530  Forearm  1   Incision (Closed) 06/30/19 Chest Left 06/30/19  1826  -- 755            Pertinent Labs: CBC Latest Ref Rng & Units 07/26/2021 07/25/2021 07/24/2021  WBC 4.0 - 10.5 K/uL 9.4 8.4 6.0  Hemoglobin 13.0 - 17.0 g/dL 14.5 13.4 12.9(L)  Hematocrit 39.0 - 52.0 % 39.8 36.5(L) 35.1(L)  Platelets 150 - 400 K/uL 105(L) 95(L) 77(L)    CMP Latest Ref Rng & Units 07/26/2021 07/25/2021 07/24/2021  Glucose 70 - 99 mg/dL 112(H) 105(H) 93  BUN 6 - 20 mg/dL 13 9 7   Creatinine 0.61 - 1.24 mg/dL 0.76 0.51(L) 0.57(L)  Sodium 135 - 145 mmol/L 133(L) 133(L) 135  Potassium 3.5 - 5.1 mmol/L 4.7  4.6 4.2  Chloride 98 - 111 mmol/L 105 106 109  CO2 22 - 32 mmol/L 23 19(L) 20(L)  Calcium 8.9 - 10.3 mg/dL 8.5(L) 8.4(L) 8.6(L)  Total Protein 6.5 - 8.1 g/dL 7.8 6.7 7.0  Total Bilirubin 0.3 - 1.2 mg/dL 3.9(H) 3.4(H) 3.6(H)  Alkaline Phos 38 - 126 U/L 179(H) 199(H) 167(H)  AST 15 - 41 U/L 170(H) 172(H) 183(H)  ALT 0 - 44 U/L 52(H) 50(H) 54(H)    No results for input(s): GLUCAP in the last 72 hours.   Pertinent Imaging: No results found.  ASSESSMENT/PLAN:  Assessment: Principal Problem:   Decompensated hepatic cirrhosis (HCC) Active Problems:   Cirrhosis (HCC)   Antonio [name] is a 51 y.o. with pertinent PMH of cirrhosis, alcohol and polysubstance use disorder, hepatits B and C, spl, who presented with complaints of abdominal pain and tightness and admitted for cirrhosis on hospital day 2  Cirrhosis Alcohol use disorder H/o untreated Hepatitis B/C C/f Hepatocellular carcinoma His cirrhosis  could be due to alcohol use, hepatitis C, or a combination of the two.  Hemodynamically stable. No evidence of encephalopathy or bleeding at this time. MELD Na 19% (3-4% 90d mortality).  - Afebrile and normal WBC. Fluid analysis gram stain has been negative and shown no growth thus far. Will hold off on SBP prophylaxis at this time. - Fluid studies have not shown any indication of infection. Fluid has reaccumulated in his abdomen. Palliative recommending pleurex cath be placed. IR has been consulted for placement of pleurex cath for home use.  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 Macrovascular invasion was also noted. Treatment options appear to be limited. Oncology and palliative have met with patient and recommended home hospice.   - Pain likely due to ascites. Symptom control per palliative recs. Attempting to get home meds to patient prior to weekend in the event that he is discharged over the weekend. -Continue Lasix 40 mg p.o. daily and spironolactone 100 mg p.o. daily -Propanolol 10 mg po BID -Lactulose 10 g po q2h   Alcohol use disorder Patient drinks 6-7 beers daily. Last drink was morning of 1/31.  Slight hand tremor on exam. No other neurologic findings at this time. Alert and oriented. No tachycardia currently.  - On CIWA protocol with ativan PRN.  -Thiamine and folic acid  Tobacco use - Reports he does not use any breathing medications.  - Audible wheeze on exam. Started on albuterol PRN. -Nicotine patch   Best Practice: Diet: Cardiac diet VTE: SCDs Start: 07/23/21 1838 Code: Full AB: none DISPO: Anticipated discharge to Home pending Medical stability.  Signature: Delene Ruffini, MD  Internal Medicine Resident, PGY-1 [facility]  Pager: [phone] 1:11 PM, 07/26/2021   Please contact the on call  pager after 5 pm and on weekends at [phone].

## 2021-07-26 NOTE — Progress Notes (Addendum)
Manufacturing engineer [facility]) Hospital Liaison Note  Referral received for hospice at home. [place] liaison spoke with patient's wife [name] to confirm interest. Interest confirmed.   Hospice eligibility confirmed.   Plan is for patient to discharge home via [place]  or private vehicle once medically ready.   No current DME in the home or DME needed. Admission nurse will assess and order if needed once the patient returns home.   Please send signed DNR and paperwork home with patient, along with prescriptions/medications for comfort at discharge. Please call with any questions or concerns.   Buck Mam [facility] Liaison [phone]

## 2021-07-26 NOTE — Progress Notes (Signed)
This chaplain responded to PMT consult for creating/updating the Pt. HCPOA. The Pt. is out of the room at the time of the visit. The Pt. Wife-Monica is waiting in the room for the Pt. to return.   The chaplain educated [place] on the likelihood that the Pt. OO:ILNZV will not be completed during this admission because of the lack of the Pt. presence and a weekend notary. The chaplain educated [place] on the role of surrogate healthcare decision maker as the Pt. spouse. The chaplain understands from [facility] the [place]. friend was documented as HCPOA from a previous admission. The chaplain understands from [place] the [place]. intent is to have [place] to serve as HCPOA.  The chaplain gave [name] an incomplete AD if needed after discharge.  Chaplain Sallyanne Kuster [phone]

## 2021-07-26 NOTE — Procedures (Signed)
Interventional Radiology Procedure Note  Procedure: US guided abdominal Pleurx placement  Indication: Recurrent ascites.  Findings: Please refer to procedural dictation for full description.  Complications: None  EBL: < 10 mL  Miachel Roux, MD [phone]

## 2021-07-26 NOTE — Plan of Care (Signed)
  Problem: Nutrition: Goal: Adequate nutrition will be maintained Outcome: Progressing   Problem: Pain Managment: Goal: General experience of comfort will improve Outcome: Progressing   

## 2021-07-26 NOTE — TOC Progression Note (Addendum)
Transition of Care [facility]) - Progression Note    Patient Details  Name: Rhythm Wigfall MRN: [id number] Date of Birth: Feb 07, 1971  Transition of Care [facility] CM/SW Contact  Jacalyn Lefevre Edson Snowball, RN Phone Number: 07/26/2021, 11:13 AM  Clinical Narrative:     Spoke to patient and wife [name] at bedside. Discussed home with hospice, both in agreement.   Confirmed face sheet information.   Referral made to Audrea Muscat with ,[facility].    Audrea Muscat will review referral. Await determination.   Shanita with [facility] working on coverage for medications and NEB machine   Patient will stay in the hospital through the weekend to monitor Pleurx catheter and pain control.   PleurX catheter paperwork completed and in patient chart for DR Katsadouros signature once signed they need to be faxed and mailed to [place].  Paper work signed faxed and mailed  Pleirx card given to patient's wife [name]   Asked bedside nurse and charge nurse to order box of PleurX containers for patient to take home at discharge.  Expected Discharge Plan: [place]    Expected Discharge Plan and Services Expected Discharge Plan: [place]   Discharge Planning Services: CM Consult Post Acute Care Choice: Hospice Living arrangements for the past 2 months: Single Family Home                 DME Arranged: N/A DME Agency: NA       HH Arranged: RN [place] Agency: Hospice and [place] Date [place]: 07/26/21 Time [place]: 1112 Representative spoke with at [place]: [place] Determinants of Health ([place]) Interventions    Readmission Risk Interventions Readmission Risk Prevention Plan 06/30/2019  Transportation Screening Complete  PCP or Specialist Appt within 3-5 Days Not Complete  Not Complete comments DC to [place] or [place] Not Complete  HRI or Home Care Consult comments DC to [place] Work Consult for Recovery Care  Planning/Counseling Not Complete  SW consult not completed comments DC to [place] Screening Not Applicable  Medication Review Press photographer) Complete  Some recent data might be hidden

## 2021-07-26 NOTE — H&P (Signed)
Chief Complaint: Patient was seen in consultation today for tunneled peritoneal catheter at the request of N. Dareen Piano, MD  Referring Physician(s): Orie Fisherman. MD  Supervising Physician: Mir, Sharen Heck  Patient Status: [facility] - In-pt  History of Present Illness: Antonio [name] is a 51 y.o. male with PMH significant for ETOH abuse, asthma, cirrhosis, depression, Hep B, Hep C, polysubstance abuse and ruptured spleen. Pt diagnosed during hosptial admission with hepatocellular carcinoma. Pt is not a candidate for treatment and is going home with hopice care. Dr. Lysbeth Galas requests tunneled peritoneal catheter.   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   Hepatitis    hep b and c   History of ETOH abuse    Polysubstance overdose    Ruptured spleen    Septic arthritis of left ankle ([place]) 11/2016    Past Surgical History:  Procedure Laterality Date   INCISION AND DRAINAGE Left 11/27/2016   left ankle with hardware removal    IR PARACENTESIS  07/24/2021   NO PAST SURGERIES     spleen removed 5 yrs ago   ORIF ANKLE FRACTURE Left 10/06/2013   Procedure: LEFT ANKLE FRACTURE OPEN TREATMENT BILMALLEOLAR ANKLE INCLUDES INTERNAL FIXATION, LEFT ANKLE FRACTURE OPEN TREATMENT DISTAL TIBIOFIBULAR INCLUDES INTERNAL FIXATION ;  Surgeon: Renette Butters, MD;  Location: [place];  Service: Orthopedics;  Laterality: Left;   ORIF ANKLE FRACTURE Right 09/21/2018   Procedure: Open reduction internal fixation right trimalleolar ankle fracture;  Surgeon: Wylene Simmer, MD;  Location: [place];  Service: Orthopedics;  Laterality: Right;  67min  ok per OR desk to follow in Room 5   spllenectomy      Allergies: Fish allergy, Sulfa antibiotics, Bee venom, Haloperidol, and  Latex  Medications: Prior to Admission medications   Medication Sig Start Date End Date Taking? Authorizing Provider  acetaminophen (TYLENOL) 325 MG tablet Take 1-2 tablets (325-650 mg total) by mouth every 4 (four) hours as needed for mild pain. 07/04/19  Yes Love, Ivan Anchors, PA-C  albuterol (PROVENTIL) (2.5 MG/3ML) 0.083% nebulizer solution Take 3 mLs (2.5 mg total) by nebulization every 4 (four) hours as needed for wheezing or shortness of breath. 07/26/21   Delene Ruffini, MD  folic acid (FOLVITE) 1 MG tablet Take 1 tablet (1 mg total) by mouth daily. 07/27/21   Delene Ruffini, MD  furosemide (LASIX) 40 MG tablet Take 1 tablet (40 mg total) by mouth daily. 07/27/21   Delene Ruffini, MD  LORazepam (ATIVAN) 1 MG tablet Take 1-4 tablets (1-4 mg total) by mouth every hour as needed (withdrawal symptoms:  anxiety, agitation, insomnia, diaphoresis, nausea, vomiting, tremors, tachycardia, or hypertension.). 07/26/21   Delene Ruffini, MD  Multiple Vitamin (MULTIVITAMIN WITH MINERALS) TABS tablet Take 1 tablet by mouth daily. 07/27/21 08/26/21  Delene Ruffini, MD  nicotine (NICODERM CQ - DOSED IN MG/24 HOURS) 21 mg/24hr patch Place 1 patch (21 mg total) onto the skin at bedtime. 07/26/21   Delene Ruffini, MD  oxyCODONE (OXYCONTIN) 10 mg 12 hr tablet Take 1 tablet (10 mg total) by mouth every 12 (twelve) hours. 07/26/21   Delene Ruffini, MD  oxyCODONE (ROXICODONE) 5 MG/5ML solution Take 10 mLs (10 mg total) by mouth every 2 (two) hours as needed for breakthrough pain (give before using IV  hydromorphone). 07/26/21   Delene Ruffini, MD  propranolol (INDERAL) 10 MG tablet Take 1 tablet (10 mg total) by mouth 2 (two) times daily. 07/26/21 08/25/21  Delene Ruffini, MD  senna (SENOKOT) 8.6 MG TABS tablet Take 1 tablet (8.6 mg total) by mouth at bedtime. 07/26/21   Delene Ruffini, MD  spironolactone (ALDACTONE) 100 MG tablet Take 1 tablet (100 mg total) by mouth daily. 07/27/21   Delene Ruffini, MD   thiamine 100 MG tablet Take 1 tablet (100 mg total) by mouth daily. 07/27/21 08/26/21  Delene Ruffini, MD     Family History  Problem Relation Age of Onset   Hepatitis Mother    Alcohol abuse Brother     Social History   Socioeconomic History   Marital status: Single    Spouse name: Not on file   Number of children: Not on file   Years of education: Not on file   Highest education level: Not on file  Occupational History   Not on file  Tobacco Use   Smoking status: Former    Packs/day: 1.00    Years: 15.00    Pack years: 15.00    Types: Cigarettes   Smokeless tobacco: Never  Vaping Use   Vaping Use: Never used  Substance and Sexual Activity   Alcohol use: Not Currently    Comment: hx of abuse-in recovery   Drug use: Not Currently    Frequency: 28.0 times per week    Types: Hydrocodone, Heroin    Comment: hx of abuse-in recovery   Sexual activity: Not on file  Other Topics Concern   Not on file  Social History Narrative   Not on file   Social Determinants of Health   Financial Resource Strain: Not on file  Food Insecurity: Not on file  Transportation Needs: Not on file  Physical Activity: Not on file  Stress: Not on file  Social Connections: Not on file    Review of Systems: A 12 point ROS discussed and pertinent positives are indicated in the HPI above.  All other systems are negative.  Review of Systems  Constitutional:  Negative for chills and fever.  Respiratory:  Negative for cough and shortness of breath.   Cardiovascular:  Negative for chest pain and leg swelling.  Gastrointestinal:  Positive for abdominal distention and abdominal pain. Negative for nausea and vomiting.   Vital Signs: BP 112/65 (BP Location: Left Arm)    Pulse 69    Temp 98.2 F (36.8 C) (Oral)    Resp 18    Ht 5\' 5"  (1.651 m)    Wt 142 lb 3.2 oz (64.5 kg)    SpO2 98%    BMI 23.66 kg/m   Physical Exam Constitutional:      Appearance: He is ill-appearing.  HENT:     Head:  Normocephalic and atraumatic.     Mouth/Throat:     Mouth: Mucous membranes are moist.     Pharynx: Oropharynx is clear.  Eyes:     General: Scleral icterus present.  Cardiovascular:     Rate and Rhythm: Normal rate and regular rhythm.     Pulses: Normal pulses.     Heart sounds: Normal heart sounds. No murmur heard.   No friction rub. No gallop.  Pulmonary:     Effort: Pulmonary effort is normal. No respiratory distress.  Abdominal:     General: Bowel sounds are normal. There is distension.     Palpations: Abdomen is soft.  Tenderness: There is abdominal tenderness. There is guarding.  Skin:    General: Skin is warm and dry.  Neurological:     Mental Status: He is alert and oriented to person, place, and time.  Psychiatric:        Mood and Affect: Mood normal.        Behavior: Behavior normal.        Thought Content: Thought content normal.        Judgment: Judgment normal.    Imaging: CT Abdomen Pelvis W Contrast  Result Date: 07/23/2021 CLINICAL DATA:  Ascites.  Alcoholic liver disease. EXAM: CT ABDOMEN AND PELVIS WITH CONTRAST TECHNIQUE: Multidetector CT imaging of the abdomen and pelvis was performed using the standard protocol following bolus administration of intravenous contrast. RADIATION DOSE REDUCTION: This exam was performed according to the departmental dose-optimization program which includes automated exposure control, adjustment of the mA and/or kV according to patient size and/or use of iterative reconstruction technique. CONTRAST:  147mL OMNIPAQUE IOHEXOL 300 MG/ML  SOLN COMPARISON:  Abdominal MRI 07/06/2019 and CT 02/12/2016 FINDINGS: Lower chest: Calcified granuloma in the left lower lobe on sequence 5 image 9 measures 5 mm. Hazy densities at the posterior lung bases are suggestive for atelectasis. No pleural effusions. Hepatobiliary: Liver is diffusely heterogeneous and nodular. Findings are compatible with cirrhosis. There is a dominant hepatic lesion situated  between segment 4A and segment 8 that appears to have some peripheral enhancement. This lesion corresponds with the previously seen lesion from 2021. This lesion has markedly enlarged in size measuring 5.6 x 5.7 x 6.2 cm and previously this lesion measured up to 3.3 cm. In addition, there is a new lesion at the hepatic dome on sequence 2 image 8 that is better characterized on the sagittal images on sequence 7, image 29 measuring up to 2.4 cm. There appears to be tumor extending from the lesions toward the IVC and extending along the hepatic veins. There is evidence for tumor thrombus in the IVC on sequence 3, image 11 that measures 3.0 x 2.4 x 2.7 cm. Difficult to exclude additional small hepatic lesions. Normal appearance of the gallbladder. No significant biliary dilatation. Pancreas: Unremarkable. No pancreatic ductal dilatation or surrounding inflammatory changes. Spleen: Splenectomy. Adrenals/Urinary Tract: Normal adrenal glands. Normal appearance of both kidneys without hydronephrosis. Normal appearance of the urinary bladder. Stomach/Bowel: Normal appendix. No evidence for bowel dilatation or focal bowel inflammation. Normal appearance of the stomach. Vascular/Lymphatic: Large filling defect in the upper IVC near the hepatic vein drainage compatible with tumor thrombus. Limited evaluation of the hepatic veins on this examination. Main portal venous system is patent on the delayed images. Evidence for esophageal varices. Normal caliber of the abdominal aorta with mild atherosclerotic disease. Main visceral arteries are patent. Mildly prominent inguinal lymph nodes bilaterally. No significant lymph node enlargement in the abdomen or pelvis. Reproductive: Prostate is unremarkable. Other: Moderate amount of ascites in the abdomen and pelvis. Stranding and prominent vessels along the omentum are likely related to the cirrhosis and ascites. Negative for free air. Musculoskeletal: 8 mm focal lucency in the right  iliac wing on sequence 3 image 59 appears chronic. There is also stable target shaped area of sclerosis in the right iliac bone image 59. Schmorl's nodes and subchondral sclerosis in lower lumbar spine. No suspicious bone lesions. IMPRESSION: 1. Cirrhosis with hepatic lesions. Dominant lesion in the region of segment 4 and segment 8 has markedly enlarged since 2021. There is evidence for tumor invasion into the hepatic  veins with tumor thrombus in the IVC as described. 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 2. Ascites with esophageal varices. Findings are compatible with portal hypertension. 3. Splenectomy. Electronically Signed   By: Markus Daft M.D.   On: 07/23/2021 16:23   MR LIVER W WO CONTRAST  Result Date: 07/24/2021 CLINICAL DATA:  A 51 year old male presents for evaluation of suspected hepatocellular carcinoma. EXAM: MRI ABDOMEN WITHOUT AND WITH CONTRAST TECHNIQUE: Multiplanar multisequence MR imaging of the abdomen was performed both before and after the administration of intravenous contrast. CONTRAST:  17mL GADAVIST GADOBUTROL 1 MMOL/ML IV SOLN COMPARISON:  Comparison is made with the CT evaluation of January of 2023. FINDINGS: Lower chest: Trace LEFT pleural effusion. Hepatobiliary: There is a large mass in the RIGHT hepatic lobe. Irregular infiltrative appearing enhancement tracks from the discrete mass into the adjacent liver and further into the IVC. This is well delineated on the CT of the abdomen and pelvis that was acquired on January of 2023. The focal lesion as measured before measures 5.7 x 5.9 cm in approximate dimension on the MRI better delineated on the CT due to respiratory motion on the current exam. A cord of abnormal signal extends into the IVC, these findings are seen on image 9 of series 4. The lesion displays mosaic arterial phase enhancement and signs of washout though there is progressive enhancement that can be visualized within the lesion even on delayed  imaging captured in the coronal plane. There is a capsule type appearance. This delayed enhancement can also be seen on the CT of July 23, 2021. Tumor thrombus extending into the IVC is visualized on image 33 of series 803 and on multiple others sequences as well as on the prior CT. No discrete additional lesion though the study is limited by some motion related artifact. Liver displays a cirrhotic morphology as noted on previous exams. The portal vein remains patent. Thrombus tracks into hepatic veins retrograde from the IVC due into middle and RIGHT hepatic veins. LEFT hepatic vein is patent. Thrombus in middle and RIGHT hepatic veins is likely bland in the setting of occlusion at the hepatic venous confluence secondary to over riding tumor thrombus that extends from the dominant lesion. The second lesion outlined on the prior CT is favored to represent direct extension of tumor from the dominant lesion though could also represent a satellite lesion but is not well evaluated on the current MRI No biliary duct dilation.  The gallbladder is decompressed. Pancreas: Pancreas with normal intrinsic T1 signal. No focal pancreatic lesion is visible, assessment limited. Spleen:  Spleen is surgically absent. Adrenals/Urinary Tract:  Adrenal glands and kidneys are normal. Stomach/Bowel: No acute gastrointestinal process to the extent evaluated on abdominal MRI with respiratory motion. Signs of esophageal and periesophageal varices. Vascular/Lymphatic: Abdominal aorta is normal caliber. No adenopathy in the upper abdomen Other: Moderate volume ascites is diminished following paracentesis compared to previous imaging. Musculoskeletal: No suspicious bone lesions identified. IMPRESSION: Large RIGHT hepatic lobe mass greater than 5 cm with infiltrative tumor extending from the lesion across the liver into the inferior vena cava. Findings are compatible with hepatic neoplasm. 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 Satellite lesion versus direct tumoral extension with diffuse infiltrative process in the cephalad aspect of the liver again tracking into the  IVC confluence. This is better seen on the previous CT. There is potentially a second lesion as outlined above which may be lateral to the dominant lesion but is not well seen on the current study. In addition to tumor thrombus there is also suspected bland thrombus involving middle and RIGHT hepatic veins which are now occluded. Signs of cirrhosis and of portal hypertension as on the recent CT of the abdomen and pelvis. Diminished ascites following paracentesis. Electronically Signed   By: Zetta Bills M.D.   On: 07/24/2021 19:28   IR Paracentesis  Result Date: 07/24/2021 INDICATION: Patient with history of alcohol abuse and hepatic disease with ascites. Request for diagnostic and therapeutic paracentesis. EXAM: ULTRASOUND GUIDED DIAGNOSTIC AND THERAPEUTIC RIGHT LOWER QUADRANT PARACENTESIS MEDICATIONS: 10 mL 1 % lidocaine COMPLICATIONS: None immediate. PROCEDURE: Informed written consent was obtained from the patient after a discussion of the risks, benefits and alternatives to treatment. A timeout was performed prior to the initiation of the procedure. Initial ultrasound scanning demonstrates a large amount of ascites within the right lower abdominal quadrant. The right lower abdomen was prepped and draped in the usual sterile fashion. 1% lidocaine was used for local anesthesia. Following this, a 19 gauge, 7-cm, Yueh catheter was introduced. An ultrasound image was saved for documentation purposes. The paracentesis was performed. The catheter was removed and a dressing was applied. The patient tolerated the procedure well without immediate post procedural  complication. FINDINGS: A total of approximately 2.6 L of clear, yellow fluid was removed. Samples were sent to the laboratory as requested by the clinical team. IMPRESSION: Successful ultrasound-guided paracentesis yielding 2.6 liters of peritoneal fluid. Read by: Narda Rutherford, AGNP-BC Electronically Signed   By: Ruthann Cancer M.D.   On: 07/24/2021 11:35    Labs:  CBC: Recent Labs    07/23/21 1111 07/24/21 0316 07/25/21 0141 07/26/21 0058  WBC 8.1 6.0 8.4 9.4  HGB 13.0 12.9* 13.4 14.5  HCT 35.2* 35.1* 36.5* 39.8  PLT 105* 77* 95* 105*    COAGS: Recent Labs    07/23/21 1530  INR 1.6*    BMP: Recent Labs    07/23/21 1111 07/24/21 0316 07/25/21 0141 07/26/21 0058  NA 135 135 133* 133*  K 3.9 4.2 4.6 4.7  CL 107 109 106 105  CO2 22 20* 19* 23  GLUCOSE 110* 93 105* 112*  BUN 6 7 9 13   CALCIUM 8.7* 8.6* 8.4* 8.5*  CREATININE 0.51* 0.57* 0.51* 0.76  GFRNONAA >60 >60 >60 >60    LIVER FUNCTION TESTS: Recent Labs    07/23/21 1111 07/24/21 0316 07/25/21 0141 07/26/21 0058  BILITOT 3.6* 3.6* 3.4* 3.9*  AST 192* 183* 172* 170*  ALT 58* 54* 50* 52*  ALKPHOS 178* 167* 199* 179*  PROT 7.6 7.0 6.7 7.8  ALBUMIN 2.0* 1.8* 1.6* 1.7*    TUMOR MARKERS: No results for input(s): AFPTM, CEA, CA199, CHROMGRNA in the last 8760 hours.  Assessment and Plan: History of ETOH abuse, asthma, cirrhosis, depression, Hep B, Hep C, polysubstance abuse and ruptured spleen. Pt diagnosed during hosptial admission with hepatocellular carcinoma. Pt is not a candidate for treatment and is going home with hopice care. Dr. Lysbeth Galas requests tunneled peritoneal catheter.   Pt resting in bed. He is A&O, calm and pleasant.  He is in no distress.  When first explaining procedure, pt states that he did not want to have drain placed. Pt admitting MD talked to patient about procedure. Pt then decided that it would be better for  him to have tunneled peritoneal catheter drain placed to manage his ascites.   Pt states that he is NPO per order.  No blood thinning medications on MAR.    Risks and benefits discussed with the patient including bleeding, infection, damage to adjacent structures, bowel perforation/fistula connection, and sepsis.  All of the patient's questions were answered, patient is agreeable to proceed. Consent signed and in chart.   Thank you for this interesting consult.  I greatly enjoyed meeting Yousif Edelson and look forward to participating in their care.  A copy of this report was sent to the requesting provider on this date.  Electronically Signed: Tyson Alias, NP 07/26/2021, 2:43 PM   I spent a total of 20 minutes in face to face in clinical consultation, greater than 50% of which was counseling/coordinating care for tunneled peritoneal catheter.

## 2021-07-26 NOTE — Progress Notes (Signed)
Spoke with case worker Sales promotion account executive), aware pt's paperwork needs to be completed. Tube paperwork to tube station #[id number].
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
## 2021-07-27 DIAGNOSIS — Z720 Tobacco use: Secondary | ICD-10-CM

## 2021-07-27 DIAGNOSIS — F1010 Alcohol abuse, uncomplicated: Secondary | ICD-10-CM

## 2021-07-27 MED ORDER — MORPHINE SULFATE ER 15 MG PO TBCR
30.0000 mg | EXTENDED_RELEASE_TABLET | Freq: Two times a day (BID) | ORAL | Status: DC
  Administered 2021-07-27 – 2021-07-31 (×8): 30 mg via ORAL
  Filled 2021-07-27 (×8): qty 2

## 2021-07-27 MED ORDER — LORAZEPAM 1 MG PO TABS
2.0000 mg | ORAL_TABLET | ORAL | Status: DC | PRN
  Administered 2021-07-29: 2 mg via ORAL
  Filled 2021-07-27 (×2): qty 2

## 2021-07-27 MED ORDER — SENNA 8.6 MG PO TABS
2.0000 | ORAL_TABLET | Freq: Every day | ORAL | Status: DC
  Administered 2021-07-27 – 2021-07-30 (×4): 17.2 mg via ORAL
  Filled 2021-07-27 (×4): qty 2

## 2021-07-27 MED ORDER — POLYETHYLENE GLYCOL 3350 17 G PO PACK
17.0000 g | PACK | Freq: Every day | ORAL | Status: DC
  Administered 2021-07-27 – 2021-07-31 (×5): 17 g via ORAL
  Filled 2021-07-27 (×5): qty 1

## 2021-07-27 MED ORDER — ENOXAPARIN SODIUM 30 MG/0.3ML IJ SOSY: 30.0000 mg | PREFILLED_SYRINGE | INTRAMUSCULAR | Status: DC

## 2021-07-27 MED ORDER — MORPHINE SULFATE 10 MG/5ML PO SOLN
10.0000 mg | ORAL | Status: DC | PRN
Start: 2021-07-27 — End: 2021-07-28
  Administered 2021-07-28 (×2): 10 mg via ORAL
  Filled 2021-07-27 (×2): qty 5

## 2021-07-27 NOTE — Progress Notes (Signed)
HD#3 SUBJECTIVE:  Patient Summary: Antonio [name] is a 51 y.o. with a pertinent PMH of cirrhosis, alcohol and polysubstance use disorder, hepatits B and C, spl, who presented with complaints of abdominal pain and tightness and admitted for cirrhosis.   Overnight Events: no acute events overnight    Interm History: Patient seen and evaluated at bedside. Patient is in some discomfort due to abdominal distention but is otherwise doing okay. He tolerated placement of pleurx catheter.   OBJECTIVE:  Vital Signs: Vitals:   07/26/21 2009 07/26/21 2143 07/27/21 0417 07/27/21 0728  BP: 105/74 111/68 113/73 (!) 130/98  Pulse: (!) 59 (!) 59 (!) 59 60  Resp: _0 Temp: 98.1 F (36.7 C) 98.3 F (36.8 C) 98.3 F (36.8 C) 98.4 F (36.9 C)  TempSrc: Oral Oral Oral Oral  SpO2: 97% 99% 98% 100%  Weight:      Height:       Supplemental O2: Room Air SpO2: 100 % O2 Flow Rate (L/min): 2 L/min  Filed Weights   07/24/21 1753 07/24/21 2334  Weight: 65.2 kg 64.5 kg     Intake/Output Summary (Last 24 hours) at 07/27/2021 1439 Last data filed at 07/27/2021 0900 Gross per 24 hour  Intake 480 ml  Output 300 ml  Net 180 ml    Net IO Since Admission: 760 mL [07/27/21 1439]  Physical Exam: Physical Exam Constitutional:      General: He is not in acute distress.    Appearance: He is well-developed and normal weight. He is not ill-appearing, toxic-appearing or diaphoretic.  HENT:     Head: Normocephalic and atraumatic.     Mouth/Throat:     Mouth: Mucous membranes are moist.  Cardiovascular:     Rate and Rhythm: Normal rate and regular rhythm.     Heart sounds: Normal heart sounds. No murmur heard.   No gallop.  Pulmonary:     Effort: Pulmonary effort is normal.     Breath sounds: No wheezing.  Abdominal:     General: Abdomen is protuberant. A surgical scar is present. Bowel sounds are normal. There is distension.     Tenderness: There is generalized abdominal tenderness.      Hernia: A hernia is present. Hernia is present in the ventral area.  Skin:    General: Skin is warm and dry.  Neurological:     General: No focal deficit present.     Mental Status: He is alert.  Psychiatric:        Mood and Affect: Mood normal.        Behavior: Behavior normal.    Patient Lines/Drains/Airways Status     Active Line/Drains/Airways     Name Placement date Placement time Site Days   Peripheral IV 07/23/21 20 G Anterior;Right Forearm 07/23/21  1530  Forearm  1   Incision (Closed) 06/30/19 Chest Left 06/30/19  1826  -- 755            Pertinent Labs: CBC Latest Ref Rng & Units 07/26/2021 07/25/2021 07/24/2021  WBC 4.0 - 10.5 K/uL 9.4 8.4 6.0  Hemoglobin 13.0 - 17.0 g/dL 14.5 13.4 12.9(L)  Hematocrit 39.0 - 52.0 % 39.8 36.5(L) 35.1(L)  Platelets 150 - 400 K/uL 105(L) 95(L) 77(L)    CMP Latest Ref Rng & Units 07/26/2021 07/25/2021 07/24/2021  Glucose 70 - 99 mg/dL 112(H) 105(H) 93  BUN 6 - 20 mg/dL _1 Creatinine 0.61 - 1.24 mg/dL 0.76 0.51(L) 0.57(L)  Sodium  135 - 145 mmol/L 133(L) 133(L) 135  Potassium 3.5 - 5.1 mmol/L 4.7 4.6 4.2  Chloride 98 - 111 mmol/L 105 106 109  CO2 22 - 32 mmol/L 23 19(L) 20(L)  Calcium 8.9 - 10.3 mg/dL 8.5(L) 8.4(L) 8.6(L)  Total Protein 6.5 - 8.1 g/dL 7.8 6.7 7.0  Total Bilirubin 0.3 - 1.2 mg/dL 3.9(H) 3.4(H) 3.6(H)  Alkaline Phos 38 - 126 U/L 179(H) 199(H) 167(H)  AST 15 - 41 U/L 170(H) 172(H) 183(H)  ALT 0 - 44 U/L 52(H) 50(H) 54(H)    No results for input(s): GLUCAP in the last 72 hours.   Pertinent Imaging: IR Perc Tun Perit Cath [facility]  Result Date: 07/26/2021 INDICATION: 51 year old gentleman with cirrhosis, HCC, and recurrent ascites presents to IR for abdominal PleurX placement prior to palliative care admission. EXAM: Ultrasound-guided abdominal PleurX catheter placement MEDICATIONS: Ancef 2 g IV ANESTHESIA/SEDATION: Fentanyl 100 mcg IV; Versed 2 mg IV Moderate Sedation Time:  12 minutes The patient was continuously  monitored during the procedure by the interventional radiology nurse under my direct supervision. COMPLICATIONS: None immediate. PROCEDURE: Informed written consent was obtained from the patient after a thorough discussion of the procedural risks, benefits and alternatives. All questions were addressed. Maximal Sterile Barrier Technique was utilized including caps, mask, sterile gowns, sterile gloves, sterile drape, hand hygiene and skin antiseptic. A timeout was performed prior to the initiation of the procedure. Ultrasound examination demonstrated mild amount of ascites in the right upper quadrant. Ultrasound image of the ascites was obtained and placed in permanent medical record. Overlying skin was prepped and draped in the usual sterile fashion. Following local lidocaine administration, the right upper quadrant was accessed with a sheathed needle. PleurX catheter was brought to the access site through a short subcutaneous tunnel. Peel-away sheath placed and PleurX catheter inserted through the peel-away sheath. Approximately 300 mL of straw-colored fluid was removed. The access site was closed with absorbable suture. The PleurX catheter was secured at the skin entry site with non-absorbable silk suture. Sterile dressing applied over the insertion site. IMPRESSION: Abdominal PleurX catheter placement as above. Electronically Signed   By: Miachel Roux M.D.   On: 07/26/2021 17:03    ASSESSMENT/PLAN:  Assessment: Principal Problem:   Decompensated hepatic cirrhosis (HCC) Active Problems:   Cirrhosis (HCC)   Antonio [name] is a 51 y.o. with pertinent PMH of cirrhosis, alcohol and polysubstance use disorder, hepatits B and C, spl, who presented with complaints of abdominal pain and tightness and admitted for cirrhosis on hospital day 3  Cirrhosis Alcohol use disorder H/o untreated Hepatitis B/C C/f Hepatocellular carcinoma His cirrhosis could be due to alcohol use, hepatitis C, or a combination of  the two.  Hemodynamically stable. No evidence of encephalopathy or bleeding at this time. MELD Na 19% (3-4% 90d mortality).  - Afebrile and normal WBC. Fluid analysis gram stain has been negative and shown no growth thus far. Will hold off on SBP prophylaxis at this time. - Fluid studies have not shown any indication of infection. Fluid has reaccumulated in his abdomen. Palliative recommending pleurex cath be placed. 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 Macrovascular invasion was also noted. Treatment options appear to be limited. Oncology and palliative have met with patient and recommended home hospice.   - Pain likely due to ascites. Symptom control per palliative recs. Patient getting teaching  for pleurx and can drain 500cc to 1L of fluid each day if he has abdominal pain or difficulty breathing.  -Continue Lasix 40 mg p.o. daily and spironolactone 100 mg p.o. daily -Propanolol 10 mg po BID -Lactulose 10 g po q2h   Alcohol use disorder Patient drinks 6-7 beers daily. Last drink was morning of 1/31.  CIWA low. - Continue CIWA protocol with ativan PRN.  -Thiamine and folic acid  Tobacco use - Reports he does not use any breathing medications.  - Audible wheeze on exam yesterday. Lungs clear to auscultation this AM. S/p albuterol PRN. - Continue albuterol prn.   Best Practice: Diet: Cardiac diet VTE: SCDs Start: 07/23/21 1838 Defer pharmacological VTE ppx given his transition to hospice and DNR DNI Code: DNR/DNI AB: none DISPO: Anticipated discharge to Home pending Medical stability.  Rick Duff, MD PGY-2 Internal Medicine  Pager [phone]   Please contact the on call pager after 5 pm and on weekends at [phone].
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
## 2021-07-27 NOTE — Progress Notes (Signed)
Peritoneal catheter drained 1043ml serious cloudy drainage. Also teaching done with wife and patient so they can complete at home.  Dressing changed as well.

## 2021-07-28 DIAGNOSIS — R1084 Generalized abdominal pain: Secondary | ICD-10-CM

## 2021-07-28 MED ORDER — MORPHINE SULFATE 10 MG/5ML PO SOLN
10.0000 mg | ORAL | Status: DC | PRN
  Administered 2021-07-28 – 2021-07-31 (×9): 10 mg via ORAL
  Filled 2021-07-28 (×9): qty 5

## 2021-07-28 MED ORDER — MORPHINE SULFATE 10 MG/5ML PO SOLN: 10.0000 mg | ORAL | Status: DC | PRN

## 2021-07-28 NOTE — Progress Notes (Signed)
HD#4 SUBJECTIVE:  Patient Summary: Antonio [name] is a 51 y.o. with a pertinent PMH of cirrhosis, alcohol and polysubstance use disorder, hepatits B and C, spl, who presented with complaints of abdominal pain and tightness and admitted for cirrhosis.   Overnight Events: no acute events overnight    Interm History: Patient reporting some abdominal pain in the lower abdomen. Required HM 0.5mg  x3 and HM 0.5mg  x2 this AM. He states his pain did improve yesterday after having 1 L drained. Palliative care in patients room when IM team there and stated that they would increase in oral medications.   OBJECTIVE:  Vital Signs: Vitals:   07/27/21 1607 07/27/21 2048 07/28/21 0601 07/28/21 0738  BP: 107/64 109/72  119/81  Pulse: (!) 59 (!) 57 (!) 59 (!) 59  Resp: 16 18 18 18   Temp: 97.8 F (36.6 C) 98 F (36.7 C) 98 F (36.7 C) 98.3 F (36.8 C)  TempSrc: Oral Oral Oral Oral  SpO2: 98% 100% 100% 100%  Weight:      Height:       Supplemental O2: Room Air SpO2: 100 % O2 Flow Rate (L/min): 2 L/min  Filed Weights   07/24/21 1753 07/24/21 2334  Weight: 65.2 kg 64.5 kg     Intake/Output Summary (Last 24 hours) at 07/28/2021 1252 Last data filed at 07/28/2021 1237 Gross per 24 hour  Intake 240 ml  Output 800 ml  Net -560 ml    Net IO Since Admission: 200 mL [07/28/21 1252]  Physical Exam:  Constitutional: Thin, mal nourished, and in no distress.  HENT:  Head: Normocephalic and atraumatic.  Eyes: EOM are normal. Jaundiced  Neck: Normal range of motion.  Cardiovascular: Normal rate, regular rhythm, intact distal pulses. No gallop and no friction rub.  No murmur heard. No lower extremity edema  Pulmonary: Non labored breathing on room air, no wheezing or rales  Abdominal: Soft. Normal bowel sounds. Mildly distended and mild TTP in lower abdomen  Musculoskeletal: Normal range of motion.        General: No tenderness or edema.  Neurological: Alert and oriented to person, place,  and time. Non focal  Skin: Skin is warm and dry.    Patient Lines/Drains/Airways Status     Active Line/Drains/Airways     Name Placement date Placement time Site Days   Peripheral IV 07/23/21 20 G Anterior;Right Forearm 07/23/21  1530  Forearm  1   Incision (Closed) 06/30/19 Chest Left 06/30/19  1826  -- 755            Pertinent Labs: CBC Latest Ref Rng & Units 07/26/2021 07/25/2021 07/24/2021  WBC 4.0 - 10.5 K/uL 9.4 8.4 6.0  Hemoglobin 13.0 - 17.0 g/dL 14.5 13.4 12.9(L)  Hematocrit 39.0 - 52.0 % 39.8 36.5(L) 35.1(L)  Platelets 150 - 400 K/uL 105(L) 95(L) 77(L)    CMP Latest Ref Rng & Units 07/26/2021 07/25/2021 07/24/2021  Glucose 70 - 99 mg/dL 112(H) 105(H) 93  BUN 6 - 20 mg/dL 13 9 7   Creatinine 0.61 - 1.24 mg/dL 0.76 0.51(L) 0.57(L)  Sodium 135 - 145 mmol/L 133(L) 133(L) 135  Potassium 3.5 - 5.1 mmol/L 4.7 4.6 4.2  Chloride 98 - 111 mmol/L 105 106 109  CO2 22 - 32 mmol/L 23 19(L) 20(L)  Calcium 8.9 - 10.3 mg/dL 8.5(L) 8.4(L) 8.6(L)  Total Protein 6.5 - 8.1 g/dL 7.8 6.7 7.0  Total Bilirubin 0.3 - 1.2 mg/dL 3.9(H) 3.4(H) 3.6(H)  Alkaline Phos 38 - 126 U/L 179(H)  199(H) 167(H)  AST 15 - 41 U/L 170(H) 172(H) 183(H)  ALT 0 - 44 U/L 52(H) 50(H) 54(H)    No results for input(s): GLUCAP in the last 72 hours.   Pertinent Imaging: No results found.  ASSESSMENT/PLAN:  Assessment: Principal Problem:   Decompensated hepatic cirrhosis (HCC) Active Problems:   Cirrhosis (HCC)   Antonio [name] is a 51 y.o. with pertinent PMH of cirrhosis, alcohol and polysubstance use disorder, hepatits B and C, spl, who presented with complaints of abdominal pain and tightness and admitted for cirrhosis on hospital day 4  Cirrhosis Alcohol use disorder H/o untreated Hepatitis B/C C/f Hepatocellular carcinoma Patient with likely [place], tissue bx unable to be obtained per IR and will not be pursued again due to patient's transition to DNR DNI and desire for home with hospice. He currently has  a pleurx in place for his abdominal pain and had 1L removed yesterday and 650 cc removed this AM. He continues to receive IV pain medications however palliative is onboard and helping [place] to increase in oral pain medications so he can safely go home. Currently he is on MS contin 30mg  BID. His oxycodone was discontinued.  -Plan for discharge home with hospice in the AM if pain well controlled on MS contin  Alcohol use disorder CIWA low  - CIWA protocol d/c'd -Thiamine and folic acid  Tobacco use - Reports he does not use any breathing medications.  - Lungs clear to auscultation this AM. S/p albuterol PRN. - Continue albuterol prn.   Best Practice: Diet: Low salt diet  VTE:  Defer pharmacological VTE ppx given his transition to hospice and DNR DNI Code: DNR/DNI AB: none DISPO: Anticipated discharge to Home pending Medical stability.  Rick Duff, MD PGY-2 Internal Medicine  Pager [phone]   Please contact the on call pager after 5 pm and on weekends at [phone].
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
## 2021-07-28 NOTE — Progress Notes (Signed)
Peritoneal catheter drained into pleurx drain. 650 mL of yellow fluid. Patient tolerated well. New dressing applied to catheter site.

## 2021-07-28 NOTE — Progress Notes (Signed)
Manufacturing engineer [facility]) Hospital Liaison Note   Referral received for hospice at home. [place] liaison spoke with patient's wife [name] to confirm interest. Hospice eligibility confirmed.    Plan is for patient to discharge home via [place] or private vehicle once medically ready.   No current DME in the home or DME needed at this time.    Please send signed DNR and paperwork home with patient, along with prescriptions/medications for comfort at discharge.   Please call with any questions or concerns.   Thank you, Clementeen Hoof, BSN, [facility] [phone]

## 2021-07-29 ENCOUNTER — Other Ambulatory Visit (HOSPITAL_COMMUNITY): Payer: Self-pay

## 2021-07-29 ENCOUNTER — Inpatient Hospital Stay (HOSPITAL_COMMUNITY): Payer: Self-pay

## 2021-07-29 LAB — CULTURE, BODY FLUID W GRAM STAIN -BOTTLE: Culture: NO GROWTH

## 2021-07-29 MED ORDER — MILK AND MOLASSES ENEMA
1.0000 | Freq: Once | RECTAL | Status: AC
  Administered 2021-07-29: 240 mL via RECTAL
  Filled 2021-07-29: qty 240

## 2021-07-29 MED ORDER — BISACODYL 10 MG RE SUPP: 10.0000 mg | Freq: Every day | RECTAL | Status: DC | PRN
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
## 2021-07-29 NOTE — Progress Notes (Signed)
This chaplain responded to RN-Rachel page for following up on the creating/updating of the Pt. Advance Directive. The Pt. wife-Monica is at the bedside. The chaplain observed the Pt. struggling to stay awake during the chaplain's visit.  The chaplain understands the Pt. prefers to talk to "Marcie Bal" the Pt. existing HCPOA before making any changes to his HCPOA. The chaplain understands the Pt. is questioning "which person will make medical decisions for him that best matches his own voice." The chaplain understands [name] is willing to "accept the Pt. choice." However, [name] adds it is "difficult" to contact someone else when medical decisions need to be made.   The chaplain will revisit on Tuesday afternoon.  Chaplain Sallyanne Kuster [phone]

## 2021-07-29 NOTE — Progress Notes (Addendum)
Manufacturing engineer [facility]) Hospital Liaison: RN note    Hospice eligibility confirmed.   Confirmed with TOC that patient needs a nebulizer. No other DME at this time. [place] [facility] equipment manager has been notified and will contact DME provider to arrange delivery to the home. Home address has been verified and is correct in the chart. [name] is the family member to contact to arrange time of delivery.     A Please do not hesitate to call with questions.   Thank you,   Farrel Gordon, RN, [place] [facility] Liaison   [phone]

## 2021-07-29 NOTE — Discharge Summary (Addendum)
Name: Antonio [name] MRN: [id number] DOB: [birth year] 51 y.o. PCP: No primary care provider on file.  Date of Admission: 07/23/2021 10:51 AM Date of Discharge: 07/30/21 Attending Physician: Dr. Dareen Piano  Discharge Diagnosis: Principal Problem:   Decompensated hepatic cirrhosis ([place]) Active Problems:   Cirrhosis ([place])    Discharge Medications: Allergies as of 07/31/2021       Reactions   Fish Allergy Itching, Swelling   Sulfa Antibiotics Swelling   Bee Venom Swelling   Haloperidol Other (See Comments)   EPS: DYSTONIA - SEVERE   Latex         Medication List     TAKE these medications    acetaminophen 325 MG tablet Commonly known as: TYLENOL Take 1-2 tablets (325-650 mg total) by mouth every 4 (four) hours as needed for mild pain.   albuterol (2.5 MG/3ML) 0.083% nebulizer solution Commonly known as: PROVENTIL use 1 vial (2.5 mg total) by nebulization every 4 (four) hours as needed for wheezing or shortness of breath.   bisacodyl 10 MG suppository Commonly known as: DULCOLAX Place 1 suppository (10 mg total) rectally daily as needed for moderate constipation.   CertaVite/Antioxidants Tabs Tome 1 tableta por va oral diariamente. (Take 1 tablet by mouth daily.)   furosemide 40 MG tablet Commonly known as: LASIX Tome 1 tableta (40 mg en total) por va oral diariamente. (Take 1 tablet (40 mg total) by mouth daily.)   LORazepam 1 MG tablet Commonly known as: ATIVAN Take 1-4 tablets (1-4 mg total) by mouth every hour as needed (withdrawal symptoms:  anxiety, agitation, insomnia, diaphoresis, nausea, vomiting, tremors, tachycardia, or hypertension.).   LORazepam 2 MG tablet Commonly known as: ATIVAN Take 1 tablet (2 mg total) by mouth every 4 (four) hours as needed for anxiety or sleep.   morphine 30 MG 12 hr tablet Commonly known as: MS CONTIN Take 1 tablet (30 mg total) by mouth every 12 (twelve) hours.   morphine 20 MG/ML concentrated solution Commonly  known as: ROXANOL Take 0.5 mLs (10 mg total) by mouth every 2 (two) hours as needed for severe pain.   polyethylene glycol powder 17 GM/SCOOP powder Commonly known as: GLYCOLAX/MIRALAX Mix 17 g by mouth daily in 8 oz of water.   propranolol 10 MG tablet Commonly known as: INDERAL Take 1 tablet (10 mg total) by mouth 2 (two) times daily.   senna 8.6 MG Tabs tablet Commonly known as: SENOKOT Take 2 tablets (17.2 mg total) by mouth at bedtime.   spironolactone 100 MG tablet Commonly known as: ALDACTONE Tome 1 tableta (100 mg en total) por va oral diariamente. (Take 1 tablet (100 mg total) by mouth daily.)   thiamine 100 MG tablet Tome 1 tableta (100 mg en total) por va oral diariamente. (Take 1 tablet (100 mg total) by mouth daily.)        Disposition and follow-up:   Mr.Jovi Otero-Vazquez was discharged from [facility] in Stable condition.  At the hospital follow up visit please address:  1.  Follow-up:  a. none    2.  Labs / imaging needed at time of follow-up: none  3.  Pending labs/ test needing follow-up: none  4.  Medication Changes  Started: MS contin 30mg  q12hr, oxycodone solution 20mg  q2hr, ativan 2mg    Follow-up Appointments:  Follow-up Information     [facility] Follow up.   Specialty: Hospice and Palliative Medicine Contact information: [place] [phone]  Hospital Course by problem list:   # Hepatocellular carcinoma - Patient presented with complaints of abdominal swelling and pain. He was evaluated with abdominal CT and found to have increased size of liver lesion with macrovascular invasion into IVC. Oncology was asked to evaluate the patient. He was not felt to be a candidate for chemotherapy, was not a candidate for lesion resection given significant size, and not a candidate for transplant given macrovascular invasion. Palliative care was consulted, and  patient opted for hospice care. Ascites had been drained early on during hospitalization but was noted to have reaccumulated quickly. Palliative recommended pleurex catheter placement for drainage of ascites at home. IR was consulted for placement of pleurex catheter. Patient noted poor drainage and pain, he was evaluated with CT abd and noted to have no fluid for drainage. Patient and family provided with education regarding drain care.    Discharge Subjective: Patient seen and evaluated at bedside. Pain is controlled. He is able to ambulate, passing stool, appetite good.   Discharge Exam:   BP 129/90 (BP Location: Right Arm)    Pulse (!) 59    Temp 98.1 F (36.7 C) (Oral)    Resp 17    Ht 5\' 5"  (1.651 m)    Wt 64.5 kg    SpO2 98%    BMI 23.66 kg/m  Constitutional: Thin, mal nourished, and in no distress.  HENT:  Head: Normocephalic and atraumatic.  Eyes: EOM are normal. Jaundiced  Neck: Normal range of motion.  Cardiovascular: Normal rate, regular rhythm, intact distal pulses. No gallop and no friction rub.  No murmur heard. No lower extremity edema  Pulmonary: Non labored breathing on room air, no wheezing or rales  Abdominal: Soft. Normal bowel sounds. Pleurex catheter in place right side abdomen, no edema or erythema.  Musculoskeletal: Normal range of motion.        General: No tenderness or edema.  Neurological: Alert and oriented to person, place, and time. Non focal  Skin: Skin is warm and dry.   Pertinent Labs, Studies, and Procedures:  CBC Latest Ref Rng & Units 07/26/2021 07/25/2021 07/24/2021  WBC 4.0 - 10.5 K/uL 9.4 8.4 6.0  Hemoglobin 13.0 - 17.0 g/dL 14.5 13.4 12.9(L)  Hematocrit 39.0 - 52.0 % 39.8 36.5(L) 35.1(L)  Platelets 150 - 400 K/uL 105(L) 95(L) 77(L)    CMP Latest Ref Rng & Units 07/26/2021 07/25/2021 07/24/2021  Glucose 70 - 99 mg/dL 112(H) 105(H) 93  BUN 6 - 20 mg/dL 13 9 7   Creatinine 0.61 - 1.24 mg/dL 0.76 0.51(L) 0.57(L)  Sodium 135 - 145 mmol/L 133(L) 133(L) 135   Potassium 3.5 - 5.1 mmol/L 4.7 4.6 4.2  Chloride 98 - 111 mmol/L 105 106 109  CO2 22 - 32 mmol/L 23 19(L) 20(L)  Calcium 8.9 - 10.3 mg/dL 8.5(L) 8.4(L) 8.6(L)  Total Protein 6.5 - 8.1 g/dL 7.8 6.7 7.0  Total Bilirubin 0.3 - 1.2 mg/dL 3.9(H) 3.4(H) 3.6(H)  Alkaline Phos 38 - 126 U/L 179(H) 199(H) 167(H)  AST 15 - 41 U/L 170(H) 172(H) 183(H)  ALT 0 - 44 U/L 52(H) 50(H) 54(H)    CT Abdomen Pelvis W Contrast  Result Date: 07/23/2021 CLINICAL DATA:  Ascites.  Alcoholic liver disease. EXAM: CT ABDOMEN AND PELVIS WITH CONTRAST TECHNIQUE: Multidetector CT imaging of the abdomen and pelvis was performed using the standard protocol following bolus administration of intravenous contrast. RADIATION DOSE REDUCTION: This exam was performed according to the departmental dose-optimization program which includes automated exposure control, adjustment  of the mA and/or kV according to patient size and/or use of iterative reconstruction technique. CONTRAST:  144mL OMNIPAQUE IOHEXOL 300 MG/ML  SOLN COMPARISON:  Abdominal MRI 07/06/2019 and CT 02/12/2016 FINDINGS: Lower chest: Calcified granuloma in the left lower lobe on sequence 5 image 9 measures 5 mm. Hazy densities at the posterior lung bases are suggestive for atelectasis. No pleural effusions. Hepatobiliary: Liver is diffusely heterogeneous and nodular. Findings are compatible with cirrhosis. There is a dominant hepatic lesion situated between segment 4A and segment 8 that appears to have some peripheral enhancement. This lesion corresponds with the previously seen lesion from 2021. This lesion has markedly enlarged in size measuring 5.6 x 5.7 x 6.2 cm and previously this lesion measured up to 3.3 cm. In addition, there is a new lesion at the hepatic dome on sequence 2 image 8 that is better characterized on the sagittal images on sequence 7, image 29 measuring up to 2.4 cm. There appears to be tumor extending from the lesions toward the IVC and extending along the  hepatic veins. There is evidence for tumor thrombus in the IVC on sequence 3, image 11 that measures 3.0 x 2.4 x 2.7 cm. Difficult to exclude additional small hepatic lesions. Normal appearance of the gallbladder. No significant biliary dilatation. Pancreas: Unremarkable. No pancreatic ductal dilatation or surrounding inflammatory changes. Spleen: Splenectomy. Adrenals/Urinary Tract: Normal adrenal glands. Normal appearance of both kidneys without hydronephrosis. Normal appearance of the urinary bladder. Stomach/Bowel: Normal appendix. No evidence for bowel dilatation or focal bowel inflammation. Normal appearance of the stomach. Vascular/Lymphatic: Large filling defect in the upper IVC near the hepatic vein drainage compatible with tumor thrombus. Limited evaluation of the hepatic veins on this examination. Main portal venous system is patent on the delayed images. Evidence for esophageal varices. Normal caliber of the abdominal aorta with mild atherosclerotic disease. Main visceral arteries are patent. Mildly prominent inguinal lymph nodes bilaterally. No significant lymph node enlargement in the abdomen or pelvis. Reproductive: Prostate is unremarkable. Other: Moderate amount of ascites in the abdomen and pelvis. Stranding and prominent vessels along the omentum are likely related to the cirrhosis and ascites. Negative for free air. Musculoskeletal: 8 mm focal lucency in the right iliac wing on sequence 3 image 59 appears chronic. There is also stable target shaped area of sclerosis in the right iliac bone image 59. Schmorl's nodes and subchondral sclerosis in lower lumbar spine. No suspicious bone lesions. IMPRESSION: 1. Cirrhosis with hepatic lesions. Dominant lesion in the region of segment 4 and segment 8 has markedly enlarged since 2021. There is evidence for tumor invasion into the hepatic veins with tumor thrombus in the IVC as described. 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 2.  Ascites with esophageal varices. Findings are compatible with portal hypertension. 3. Splenectomy. Electronically Signed   By: Markus Daft M.D.   On: 07/23/2021 16:23   MR LIVER W WO CONTRAST  Result Date: 07/24/2021 CLINICAL DATA:  A 51 year old male presents for evaluation of suspected hepatocellular carcinoma. EXAM: MRI ABDOMEN WITHOUT AND WITH CONTRAST TECHNIQUE: Multiplanar multisequence MR imaging of the abdomen was performed both before and after the administration of intravenous contrast. CONTRAST:  17mL GADAVIST GADOBUTROL 1 MMOL/ML IV SOLN COMPARISON:  Comparison is made with the CT evaluation of January of 2023. FINDINGS: Lower chest: Trace LEFT pleural effusion. Hepatobiliary: There is a large mass in the RIGHT hepatic lobe. Irregular infiltrative appearing enhancement tracks from the discrete mass into the adjacent liver and further into the IVC.  This is well delineated on the CT of the abdomen and pelvis that was acquired on January of 2023. The focal lesion as measured before measures 5.7 x 5.9 cm in approximate dimension on the MRI better delineated on the CT due to respiratory motion on the current exam. A cord of abnormal signal extends into the IVC, these findings are seen on image 9 of series 4. The lesion displays mosaic arterial phase enhancement and signs of washout though there is progressive enhancement that can be visualized within the lesion even on delayed imaging captured in the coronal plane. There is a capsule type appearance. This delayed enhancement can also be seen on the CT of July 23, 2021. Tumor thrombus extending into the IVC is visualized on image 33 of series 803 and on multiple others sequences as well as on the prior CT. No discrete additional lesion though the study is limited by some motion related artifact. Liver displays a cirrhotic morphology as noted on previous exams. The portal vein remains patent. Thrombus tracks into hepatic veins retrograde from the IVC due  into middle and RIGHT hepatic veins. LEFT hepatic vein is patent. Thrombus in middle and RIGHT hepatic veins is likely bland in the setting of occlusion at the hepatic venous confluence secondary to over riding tumor thrombus that extends from the dominant lesion. The second lesion outlined on the prior CT is favored to represent direct extension of tumor from the dominant lesion though could also represent a satellite lesion but is not well evaluated on the current MRI No biliary duct dilation.  The gallbladder is decompressed. Pancreas: Pancreas with normal intrinsic T1 signal. No focal pancreatic lesion is visible, assessment limited. Spleen:  Spleen is surgically absent. Adrenals/Urinary Tract:  Adrenal glands and kidneys are normal. Stomach/Bowel: No acute gastrointestinal process to the extent evaluated on abdominal MRI with respiratory motion. Signs of esophageal and periesophageal varices. Vascular/Lymphatic: Abdominal aorta is normal caliber. No adenopathy in the upper abdomen Other: Moderate volume ascites is diminished following paracentesis compared to previous imaging. Musculoskeletal: No suspicious bone lesions identified. IMPRESSION: Large RIGHT hepatic lobe mass greater than 5 cm with infiltrative tumor extending from the lesion across the liver into the inferior vena cava. Findings are compatible with hepatic neoplasm. 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 Satellite lesion versus direct tumoral extension with diffuse infiltrative process in the cephalad aspect of the liver again tracking into the IVC confluence. This is better seen on the previous CT. There is potentially a second lesion as outlined above which may be lateral to the dominant lesion but is not well seen on the current  study. In addition to tumor thrombus there is also suspected bland thrombus involving middle and RIGHT hepatic veins which are now occluded. Signs of cirrhosis and of portal hypertension as on the recent CT of the abdomen and pelvis. Diminished ascites following paracentesis. Electronically Signed   By: Zetta Bills M.D.   On: 07/24/2021 19:28   IR Paracentesis  Result Date: 07/24/2021 INDICATION: Patient with history of alcohol abuse and hepatic disease with ascites. Request for diagnostic and therapeutic paracentesis. EXAM: ULTRASOUND GUIDED DIAGNOSTIC AND THERAPEUTIC RIGHT LOWER QUADRANT PARACENTESIS MEDICATIONS: 10 mL 1 % lidocaine COMPLICATIONS: None immediate. PROCEDURE: Informed written consent was obtained from the patient after a discussion of the  risks, benefits and alternatives to treatment. A timeout was performed prior to the initiation of the procedure. Initial ultrasound scanning demonstrates a large amount of ascites within the right lower abdominal quadrant. The right lower abdomen was prepped and draped in the usual sterile fashion. 1% lidocaine was used for local anesthesia. Following this, a 19 gauge, 7-cm, Yueh catheter was introduced. An ultrasound image was saved for documentation purposes. The paracentesis was performed. The catheter was removed and a dressing was applied. The patient tolerated the procedure well without immediate post procedural complication. FINDINGS: A total of approximately 2.6 L of clear, yellow fluid was removed. Samples were sent to the laboratory as requested by the clinical team. IMPRESSION: Successful ultrasound-guided paracentesis yielding 2.6 liters of peritoneal fluid. Read by: Narda Rutherford, AGNP-BC Electronically Signed   By: Ruthann Cancer M.D.   On: 07/24/2021 11:35     Discharge Instructions: Discharge Instructions     Ambulatory Pleural Drainage Schedule   Complete by: As directed    Drain, if patient his having abdominal pain, daily, up to max of  1L   Call MD for:  persistant nausea and vomiting   Complete by: As directed    Call MD for:  severe uncontrolled pain   Complete by: As directed    Diet - low sodium heart healthy   Complete by: As directed    Diet - low sodium heart healthy   Complete by: As directed    Increase activity slowly   Complete by: As directed    Increase activity slowly   Complete by: As directed    No wound care   Complete by: As directed    No wound care   Complete by: As directed      We have provided you with MS Contin 30mg  to take every 12 hours as needed for pain. We also provided you with oxycodone solution 20mg  to take every 2 hours as needed for breakthrough pain. A prescription for ativan has also been sent for anxiety. Please pick these medications up from your pharmacy. The hospice nurses will help you manage your drain.   Signed: Delene Ruffini, MD 07/31/2021, 2:22 PM   Pager: [phone]

## 2021-07-29 NOTE — Progress Notes (Signed)
Pt willing to try the drain again. Pt complained of severe pain after approximately 10 seconds of draining. Restarted drain again but drain has stopped suctioning. Able to get 45ml of yellow fluid. Will get another pleurex and will try again later this shift.

## 2021-07-29 NOTE — Progress Notes (Addendum)
HD#5 SUBJECTIVE:  Patient Summary: Antonio [name] is a 51 y.o. with a pertinent PMH of cirrhosis, alcohol and polysubstance use disorder, hepatits B and C, spl, who presented with complaints of abdominal pain and tightness and admitted for cirrhosis.   Overnight Events: no acute events overnight    Interm History: Patient reporting drain not working. Per day nurse, patient received Ativan and morphine earlier I the day and became drowsy which persisted throughout the afternoon.    OBJECTIVE:  Vital Signs: Vitals:   07/28/21 2137 07/28/21 2140 07/29/21 0721 07/29/21 1545  BP: 111/75 111/75 124/85 118/71  Pulse: 62 (!) 59 66 (!) 58  Resp:   16 16  Temp:  98.5 F (36.9 C) 98.6 F (37 C) (!) 97.5 F (36.4 C)  TempSrc:  Oral Oral Oral  SpO2: 99% 98% 100% 97%  Weight:      Height:       Supplemental O2: Room Air SpO2: 97 % O2 Flow Rate (L/min): 2 L/min  Filed Weights   07/24/21 1753 07/24/21 2334  Weight: 65.2 kg 64.5 kg    No intake or output data in the 24 hours ending 07/29/21 1738  Net IO Since Admission: 200 mL [07/29/21 1738]  Physical Exam:  Constitutional: Thin, mal nourished, and in no distress.  HENT:  Head: Normocephalic and atraumatic.  Eyes: EOM are normal. Jaundiced  Neck: Normal range of motion.  Cardiovascular: Normal rate, regular rhythm, intact distal pulses. No gallop and no friction rub.  No murmur heard. No lower extremity edema  Pulmonary: Non labored breathing on room air, no wheezing or rales  Abdominal: Soft. Normal bowel sounds. Mildly distended and mild TTP in lower abdomen  Musculoskeletal: Normal range of motion.        General: No tenderness or edema.  Neurological: Alert and oriented to person, place, and time. Non focal  Skin: Skin is warm and dry.    Patient Lines/Drains/Airways Status     Active Line/Drains/Airways     Name Placement date Placement time Site Days   Peripheral IV 07/23/21 20 G Anterior;Right Forearm  07/23/21  1530  Forearm  1   Incision (Closed) 06/30/19 Chest Left 06/30/19  1826  -- 755            Pertinent Labs: CBC Latest Ref Rng & Units 07/26/2021 07/25/2021 07/24/2021  WBC 4.0 - 10.5 K/uL 9.4 8.4 6.0  Hemoglobin 13.0 - 17.0 g/dL 14.5 13.4 12.9(L)  Hematocrit 39.0 - 52.0 % 39.8 36.5(L) 35.1(L)  Platelets 150 - 400 K/uL 105(L) 95(L) 77(L)    CMP Latest Ref Rng & Units 07/26/2021 07/25/2021 07/24/2021  Glucose 70 - 99 mg/dL 112(H) 105(H) 93  BUN 6 - 20 mg/dL 13 9 7   Creatinine 0.61 - 1.24 mg/dL 0.76 0.51(L) 0.57(L)  Sodium 135 - 145 mmol/L 133(L) 133(L) 135  Potassium 3.5 - 5.1 mmol/L 4.7 4.6 4.2  Chloride 98 - 111 mmol/L 105 106 109  CO2 22 - 32 mmol/L 23 19(L) 20(L)  Calcium 8.9 - 10.3 mg/dL 8.5(L) 8.4(L) 8.6(L)  Total Protein 6.5 - 8.1 g/dL 7.8 6.7 7.0  Total Bilirubin 0.3 - 1.2 mg/dL 3.9(H) 3.4(H) 3.6(H)  Alkaline Phos 38 - 126 U/L 179(H) 199(H) 167(H)  AST 15 - 41 U/L 170(H) 172(H) 183(H)  ALT 0 - 44 U/L 52(H) 50(H) 54(H)    No results for input(s): GLUCAP in the last 72 hours.   Pertinent Imaging: US Abdomen Limited  Result Date: 07/29/2021 CLINICAL DATA:  Ascites  EXAM: LIMITED ABDOMEN ULTRASOUND FOR ASCITES TECHNIQUE: Limited ultrasound survey for ascites was performed in all four abdominal quadrants. COMPARISON:  MRI 07/24/2021 FINDINGS: All 4 quadrants were imaged. Small amounts of fluid are observed in the right upper quadrant, right lower quadrant, left lower quadrant, with the overall amount of ascites being small at this time. IMPRESSION: 1. Currently small amount of ascites observed. Electronically Signed   By: Van Clines M.D.   On: 07/29/2021 15:56    ASSESSMENT/PLAN:  Assessment: Principal Problem:   Decompensated hepatic cirrhosis ([facility]) Active Problems:   Cirrhosis ([facility])   Antonio [name] is a 51 y.o. with pertinent PMH of cirrhosis, alcohol and polysubstance use disorder, hepatits B and C, spl, who presented with complaints of abdominal pain and  tightness and admitted for cirrhosis on hospital day 5  Cirrhosis Alcohol use disorder H/o untreated Hepatitis B/C C/f Hepatocellular carcinoma Pleurex catheter in place to aid in ascitic fluid removal. Per nurse, drain was causing significant pain while it was draining, no longer draining fluid. IR was asked to evaluate and ordered abd US showing small amount of ascitic fluid. Will obtain CT abd to determine if drain needs repositioning. Await further recs for drain per IR. Patient is otherwise stable for discharge.   - Patient's ex-girlfriend Madlyn Frankel is his POA and will be helping to manage his care upon discharge. She was instructed how to care for drain earlier today.  - Of note, patient became very drowsy after receiving morphine and ativan. Will discuss with palliative if medication adjustments are necessary. Further recommendations per palliative care.  Alcohol use disorder CIWA low  - CIWA protocol d/c'd -Thiamine and folic acid  Tobacco use - Reports he does not use any breathing medications.  - Lungs clear to auscultation this AM. S/p albuterol PRN. - Continue albuterol prn.   Best Practice: Diet: Low salt diet  VTE:  Defer pharmacological VTE ppx given his transition to hospice and DNR DNI Code: DNR/DNI AB: none DISPO: Anticipated discharge to Home pending Medical stability.  Rick Duff, MD PGY-2 Internal Medicine  Pager [phone]   Please contact the on call pager after 5 pm and on weekends at [phone].

## 2021-07-29 NOTE — Progress Notes (Signed)
Enema given to patient at 1800- effective.  1900- Dayshift RN attempted to drain pleur-x this evening with nightshift RN. Started draining yellow, clear fluid from pleur-x, but stopped due to the severe pain this caused to patient.

## 2021-07-29 NOTE — TOC Progression Note (Addendum)
Transition of Care [facility]) - Progression Note    Patient Details  Name: Antonio [name] MRN: [id number] Date of Birth: 1971/04/17  Transition of Care [facility]) CM/SW Contact  Antonio Lefevre Edson Snowball, RN Phone Number: 07/29/2021, 8:21 AM  Clinical Narrative:     Followinf up with Antonio [name] with [facility].   On Friday NCM completed and faxed and mailed paperwork for Antonio [name] to Antonio [name]. Provided patient's wife with card.   Antonio [name] with Antonio [name] was working on getting cost of pain medications ($85.00) cover through [employer].   Antonio [name] will cover other medications.   Also, need to confirm that Antonio [name] can drain PleurX catheter every second day. NCM will ask [name] nurse to teach wife how to care for drain also.   On Friday NCM asked nurse and charge nurse to order box for 10 [name] from Antonio [name] for patient to take home with him.   NCM also, asked Antonio [name] if hospice will cover cost of PleurX [name] .  0930 In progression this morning nurse reported she will send patient home with box of 10 Pleurx [name], patient already has been taught PleurX care and nurse will teach wife when she arrives. Confirmed with Antonio [name] with Antonio [name] they will have pain medications sent to their pharmacy and cover cost. Other medications will be filled through [place] through [facility]. Antonio [name] will order NEB machine   Spoke to patient at bedside and wife at bedside. Explained above. They would like scripts for Ativan oxycodone, MS ox Contin ( any pain medications) sent to [facility] on [address]. TOC pharmacy will cover other medications through [facility]. Vitamins nicoderm are not covered by [name] or [facility]. NCM asked TOC pharmacy to call patient and wife with cost of medications not covered. NCM secure chatted MD's and [name] pharmacy.  [place] came to do POA paperwork with patient. Currently his ex girlfriend Antonio [name]  is POA. Patient wants Antonio [name] to stay POA. His wife  Antonio [name] said if Antonio [name] remains POA then patient needs to discharge to Antonio [name]'s address and not hers [facility]). Per Antonio [name] , her and Antonio [name] are legally married but were not living together prior to admission . Antonio [name] and [place] "get along". We called Antonio [name] and placed her on speaker phone Antonio [name] and [place] and [place]  were present). Antonio [name] agreed to stay POA and for Antonio [name]  to DC to her address. Antonio [name] also agreed to come to the [name] and be taught drain care. Antonio [name]'s address is [address], [place], [place] [address] home [phone], cell [phone]. [place] agreed to go to Antonio [name] also. NEB machine will need to be sent there. NCM secure chatted nurse, Antonio [name] with Antonio [name] and MD's. Antonio [name] will no longer be assisting patient with drain   Expected Discharge Plan: [place]    Expected Discharge Plan and Services Expected Discharge Plan: [place]   Discharge Planning Services: CM Consult Post Acute Care Choice: Hospice Living arrangements for the past 2 months: Single Family Home                 DME Arranged: N/A DME Agency: NA       HH Arranged: RN [place] Agency: Hospice and [place] Date [place]: 07/26/21 Time [place]: 1112 Representative spoke with at [place]: Antonio [name]   Social Determinants of Health (SDOH) Interventions    Readmission Risk Interventions Readmission Risk Prevention Plan 06/30/2019  Transportation Screening  Complete  PCP or Specialist Appt within 3-5 Days Not Complete  Not Complete comments DC to CIR  HRI or [place] Not Complete  HRI or Home Care Consult comments DC to CIR  Social Work Consult for Recovery Care Planning/Counseling Not Complete  SW consult not completed comments DC to [place] Screening Not Applicable  Medication Review (RN Care Manager) Complete  Some recent data might be hidden

## 2021-07-30 ENCOUNTER — Other Ambulatory Visit (HOSPITAL_COMMUNITY): Payer: Self-pay

## 2021-07-30 ENCOUNTER — Inpatient Hospital Stay (HOSPITAL_COMMUNITY): Payer: Self-pay

## 2021-07-30 DIAGNOSIS — K7030 Alcoholic cirrhosis of liver without ascites: Secondary | ICD-10-CM

## 2021-07-30 MED ORDER — LORAZEPAM 1 MG PO TABS: 1.0000 mg | ORAL_TABLET | ORAL | 0 refills | Status: DC | PRN

## 2021-07-30 MED ORDER — POLYETHYLENE GLYCOL 3350 17 GM/SCOOP PO POWD
17.0000 g | Freq: Every day | ORAL | 0 refills | Status: AC
  Filled 2021-07-30: qty 238, 14d supply, fill #0

## 2021-07-30 MED ORDER — CERTAVITE/ANTIOXIDANTS PO TABS
1.0000 | ORAL_TABLET | Freq: Every day | ORAL | 0 refills | Status: AC
  Filled 2021-07-30: qty 30, 30d supply, fill #0

## 2021-07-30 MED ORDER — FUROSEMIDE 40 MG PO TABS
40.0000 mg | ORAL_TABLET | Freq: Every day | ORAL | 1 refills | Status: AC
Start: 2021-07-30 — End: ?
  Filled 2021-07-30: qty 30, 30d supply, fill #0

## 2021-07-30 MED ORDER — LORAZEPAM 2 MG PO TABS: 2.0000 mg | ORAL_TABLET | ORAL | 0 refills | Status: DC | PRN

## 2021-07-30 MED ORDER — MORPHINE SULFATE 10 MG/5ML PO SOLN: 10.0000 mg | ORAL | 0 refills | Status: DC | PRN

## 2021-07-30 MED ORDER — MORPHINE SULFATE ER 30 MG PO TBCR
30.0000 mg | EXTENDED_RELEASE_TABLET | Freq: Two times a day (BID) | ORAL | 0 refills | Status: DC
  Filled 2021-07-30: qty 60, 30d supply, fill #0

## 2021-07-30 MED ORDER — LORAZEPAM 2 MG PO TABS
2.0000 mg | ORAL_TABLET | ORAL | 0 refills | Status: DC | PRN
  Filled 2021-07-30: qty 30, 5d supply, fill #0

## 2021-07-30 MED ORDER — SENNA 8.6 MG PO TABS
2.0000 | ORAL_TABLET | Freq: Every day | ORAL | 0 refills | Status: DC
  Filled 2021-07-30: qty 120, 60d supply, fill #0

## 2021-07-30 MED ORDER — ALBUTEROL SULFATE (2.5 MG/3ML) 0.083% IN NEBU
2.5000 mg | INHALATION_SOLUTION | RESPIRATORY_TRACT | 12 refills | Status: AC | PRN
Start: 2021-07-30 — End: ?
  Filled 2021-07-30: qty 90, 5d supply, fill #0

## 2021-07-30 MED ORDER — SPIRONOLACTONE 100 MG PO TABS
100.0000 mg | ORAL_TABLET | Freq: Every day | ORAL | 1 refills | Status: AC
Start: 2021-07-30 — End: ?
  Filled 2021-07-30: qty 30, 30d supply, fill #0

## 2021-07-30 MED ORDER — MORPHINE SULFATE ER 30 MG PO TBCR
30.0000 mg | EXTENDED_RELEASE_TABLET | Freq: Two times a day (BID) | ORAL | 0 refills | Status: DC
Start: 2021-07-30 — End: 2021-07-31

## 2021-07-30 MED ORDER — THIAMINE HCL 100 MG PO TABS
100.0000 mg | ORAL_TABLET | Freq: Every day | ORAL | 0 refills | Status: AC
  Filled 2021-07-30: qty 30, 30d supply, fill #0

## 2021-07-30 MED ORDER — BISACODYL 10 MG RE SUPP
10.0000 mg | Freq: Every day | RECTAL | 0 refills | Status: AC | PRN
  Filled 2021-07-30: qty 2, 2d supply, fill #0

## 2021-07-30 MED ORDER — SENNA 8.6 MG PO TABS
2.0000 | ORAL_TABLET | Freq: Every day | ORAL | 0 refills | Status: AC
  Filled 2021-07-30: qty 120, 60d supply, fill #0

## 2021-07-30 MED ORDER — POLYETHYLENE GLYCOL 3350 17 GM/SCOOP PO POWD
17.0000 g | Freq: Every day | ORAL | 0 refills | Status: DC
  Filled 2021-07-30: qty 238, 14d supply, fill #0

## 2021-07-30 MED ORDER — PROPRANOLOL HCL 10 MG PO TABS
10.0000 mg | ORAL_TABLET | Freq: Two times a day (BID) | ORAL | 0 refills | Status: AC
  Filled 2021-07-30: qty 60, 30d supply, fill #0

## 2021-07-30 MED ORDER — MORPHINE SULFATE 10 MG/5ML PO SOLN
10.0000 mg | ORAL | 0 refills | Status: DC | PRN
  Filled 2021-07-30: qty 500, 9d supply, fill #0

## 2021-07-30 MED ORDER — BISACODYL 10 MG RE SUPP
10.0000 mg | Freq: Every day | RECTAL | 0 refills | Status: DC | PRN
  Filled 2021-07-30: qty 12, 12d supply, fill #0

## 2021-07-30 NOTE — Discharge Instructions (Addendum)
Dear Antonio [name],  Thank you for trusting [place] with your care. We treated you in the hospital for abdominal pain and swelling.   We have provided you with MS Contin 30mg  to take every 12 hours as needed for pain. We also provided you with oxycodone solution 10mg  to take every 2 hours as needed for breakthrough pain. A prescription for ativan has also been sent for anxiety. Please pick these medications up from your pharmacy. The hospice nurses will help you manage your drain. Please only drain it when your abdomen becomes distended and painful.

## 2021-07-30 NOTE — Progress Notes (Addendum)
HD#6 SUBJECTIVE:  Patient Summary: Antonio [name] is a 51 y.o. with a pertinent PMH of cirrhosis, alcohol and polysubstance use disorder, hepatits B and C, spl, who presented with complaints of abdominal pain and tightness and admitted for cirrhosis.   Overnight Events: no acute events overnight    Interm History: Patient seen and evaluated at bedside. Reporting pain controlled. Passing stool. Appetite maintained.   OBJECTIVE:  Vital Signs: Vitals:   07/30/21 0341 07/30/21 0900 07/30/21 1000 07/30/21 1618  BP: 110/80 139/89  119/69  Pulse: 60 (!) 57 (!) 55 (!) 57  Resp:  18  16  Temp: 98.5 F (36.9 C) 98 F (36.7 C)  98.2 F (36.8 C)  TempSrc: Oral Oral  Oral  SpO2: 99% 100%  98%  Weight:      Height:       Supplemental O2: Room Air SpO2: 98 % O2 Flow Rate (L/min): 2 L/min  Filed Weights   07/24/21 1753 07/24/21 2334  Weight: 65.2 kg 64.5 kg     Intake/Output Summary (Last 24 hours) at 07/30/2021 1756 Last data filed at 07/30/2021 1400 Gross per 24 hour  Intake 718 ml  Output 750 ml  Net -32 ml    Net IO Since Admission: 168 mL [07/30/21 1756]  Physical Exam:  Constitutional: Thin, mal nourished, and in no distress.  HENT:  Head: Normocephalic and atraumatic.  Eyes: EOM are normal. Jaundiced  Neck: Normal range of motion.  Cardiovascular: Normal rate, regular rhythm, intact distal pulses. No gallop and no friction rub.  No murmur heard. No lower extremity edema  Pulmonary: Non labored breathing on room air, no wheezing or rales  Abdominal: Soft. Normal bowel sounds. Mildly distended and mild TTP in lower abdomen  Musculoskeletal: Normal range of motion.        General: No tenderness or edema.  Neurological: Alert and oriented to person, place, and time. Non focal  Skin: Skin is warm and dry.    Patient Lines/Drains/Airways Status     Active Line/Drains/Airways     Name Placement date Placement time Site Days   Peripheral IV 07/23/21 20 G  Anterior;Right Forearm 07/23/21  1530  Forearm  1   Incision (Closed) 06/30/19 Chest Left 06/30/19  1826  -- 755            Pertinent Labs: CBC Latest Ref Rng & Units 07/26/2021 07/25/2021 07/24/2021  WBC 4.0 - 10.5 K/uL 9.4 8.4 6.0  Hemoglobin 13.0 - 17.0 g/dL 14.5 13.4 12.9(L)  Hematocrit 39.0 - 52.0 % 39.8 36.5(L) 35.1(L)  Platelets 150 - 400 K/uL 105(L) 95(L) 77(L)    CMP Latest Ref Rng & Units 07/26/2021 07/25/2021 07/24/2021  Glucose 70 - 99 mg/dL 112(H) 105(H) 93  BUN 6 - 20 mg/dL 13 9 7   Creatinine 0.61 - 1.24 mg/dL 0.76 0.51(L) 0.57(L)  Sodium 135 - 145 mmol/L 133(L) 133(L) 135  Potassium 3.5 - 5.1 mmol/L 4.7 4.6 4.2  Chloride 98 - 111 mmol/L 105 106 109  CO2 22 - 32 mmol/L 23 19(L) 20(L)  Calcium 8.9 - 10.3 mg/dL 8.5(L) 8.4(L) 8.6(L)  Total Protein 6.5 - 8.1 g/dL 7.8 6.7 7.0  Total Bilirubin 0.3 - 1.2 mg/dL 3.9(H) 3.4(H) 3.6(H)  Alkaline Phos 38 - 126 U/L 179(H) 199(H) 167(H)  AST 15 - 41 U/L 170(H) 172(H) 183(H)  ALT 0 - 44 U/L 52(H) 50(H) 54(H)    No results for input(s): GLUCAP in the last 72 hours.   Pertinent Imaging: CT ABDOMEN PELVIS  WO CONTRAST  Result Date: 07/30/2021 CLINICAL DATA:  Pleurex cath not draining, eval for placement issue vs problem with drain EXAM: CT ABDOMEN AND PELVIS WITHOUT CONTRAST TECHNIQUE: Multidetector CT imaging of the abdomen and pelvis was performed following the standard protocol without IV contrast. RADIATION DOSE REDUCTION: This exam was performed according to the departmental dose-optimization program which includes automated exposure control, adjustment of the mA and/or kV according to patient size and/or use of iterative reconstruction technique. COMPARISON:  CT AP, most recently 07/23/2021. IR ultrasound, 07/26/2021. FINDINGS: Suboptimal evaluation, secondary to motion degradation and a lack of intravenous contrast. Lower chest: No acute abnormality. LEFT lower lobe calcified granuloma. Hepatobiliary: Coarse heterogeneous density of liver.  Known, dominant RIGHT hepatic lobe mass is best appreciated and described on recent contrasted CT. No no gallstones, gallbladder wall thickening, or biliary dilatation. Pancreas: No pancreatic ductal dilatation or surrounding inflammatory changes. Spleen: Splenectomy with scattered clips at the surgical bed. Adrenals/Urinary Tract: Adrenal glands are unremarkable. Kidneys are normal, without renal calculi, focal lesion, or hydronephrosis. Bladder is unremarkable. Stomach/Bowel: Distal esophageal varices, better appreciated on recent contrasted CT comparison stomach is within normal limits. Appendix appears normal. No evidence of bowel wall thickening, distention, or inflammatory changes. Vascular/Lymphatic: No enlarged abdominal or pelvic lymph nodes. Reproductive: Prostate is unremarkable. Other: *No abdominal wall hernia.  Mild body wall edema. *Tunneled peritoneal drainage catheter originating from the RIGHT abdomen, appropriately positioned with tip coiled within the dependent pelvis. *No abdominopelvic ascites. Musculoskeletal: Chronic LEFT lower posterior rib deformity, incompletely imaged. Degenerative changes of imaged spine, greatest at L3-L5. No acute osseous findings. IMPRESSION: 1. Well-positioned tunneled peritoneal drainage catheter, with tip coiled within the dependent pelvis. 2. Drainage of previously demonstrated intra-abdominal ascites, with no residual on this evaluation. Electronically Signed   By: Michaelle Birks M.D.   On: 07/30/2021 11:05    ASSESSMENT/PLAN:  Assessment: Principal Problem:   Decompensated hepatic cirrhosis (HCC) Active Problems:   Cirrhosis (HCC)   Antonio [name] is a 51 y.o. with pertinent PMH of cirrhosis, alcohol and polysubstance use disorder, hepatits B and C, spl, who presented with complaints of abdominal pain and tightness and admitted for cirrhosis on hospital day 6  Cirrhosis Alcohol use disorder H/o untreated Hepatitis B/C C/f Hepatocellular  carcinoma Pleurex catheter in place to aid in ascitic fluid removal. Per nurse, drain was causing significant pain while it was draining, no longer draining fluid. IR was asked to evaluate and ordered abd US showing small amount of ascitic fluid. CT abd showed drain in proper place, no fluid to be drained. Patient is stable for discharge, unfortunately, his pain medication cannot be filled at his preferred pharmacy. Unable to contact TOC. Discharge delayed until tomorrow until medications can be organized for patient to obtain as outpatient. Anticipate discharge tomorrow AM.   - Patient's ex-girlfriend Antonio [name] is his POA and will be helping to manage his care upon discharge. She was instructed how to care for drain earlier today.    Alcohol use disorder CIWA low  - CIWA protocol d/c'd -Thiamine and folic acid  Tobacco use - Reports he does not use any breathing medications.  - Lungs clear to auscultation this AM. S/p albuterol PRN. - Continue albuterol prn.   Best Practice: Diet: Low salt diet  VTE:  Defer pharmacological VTE ppx given his transition to hospice and DNR DNI Code: DNR/DNI AB: none DISPO: Anticipated discharge to Home pending Medical stability.  Delene Ruffini, PGY1 Internal Medicine  Pager [phone]   Please contact  the on call pager after 5 pm and on weekends at [phone].

## 2021-07-30 NOTE — Progress Notes (Signed)
Manufacturing engineer [facility]) Hospital Liaison: RN note     Hospice eligibility confirmed. Awaiting discharge to proceed with setting up hospice.    Nebulizer delivered to home on 2/6 and confirmed with Janetta per TOC.    A Please do not hesitate to call with questions.   Thank you,   Farrel Gordon, RN, [place] Hospital Liaison   [phone]
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
## 2021-07-31 MED ORDER — LORAZEPAM 2 MG PO TABS: 2.0000 mg | ORAL_TABLET | ORAL | 0 refills | Status: AC | PRN

## 2021-07-31 MED ORDER — LORAZEPAM 1 MG PO TABS: 1.0000 mg | ORAL_TABLET | ORAL | 0 refills | Status: AC | PRN

## 2021-07-31 MED ORDER — MORPHINE SULFATE (CONCENTRATE) 20 MG/ML PO SOLN: 10.0000 mg | ORAL | 0 refills | Status: AC | PRN

## 2021-07-31 MED ORDER — MORPHINE SULFATE ER 30 MG PO TBCR: 30.0000 mg | EXTENDED_RELEASE_TABLET | Freq: Two times a day (BID) | ORAL | 0 refills | Status: AC

## 2021-07-31 NOTE — Progress Notes (Signed)
[place] [address] [facility]  ([facility])  Update on this Home with Hospice Referral:  An [facility] RN is scheduled to admit this patient to our Hospice services today (07/31/21) at 5:00 pm .  Spoke to [facility] and Dr. Eulas Post about plan and they will coordinate discharge orders/prescriptions to pharmacy ([facility] [address]) if patient continues to be medically stable for discharge.   Reached out to Patient POA Janett to make aware of pending discharge and her current plan is to arrive at hospital at 3:00pm to complete discharge paperwork/education and then plans to transport patient to [place] [address] to meet [place] this afternoon.   Please call with any hospice related concerns,  Gar Ponto, RN [facility] HLT [phone]

## 2021-07-31 NOTE — Progress Notes (Signed)
Seen by Dr. Dareen Piano, no need to drain pleurx catheter at this time.  Patient is aware.

## 2021-07-31 NOTE — Progress Notes (Signed)
Discharge instructions given to patient and patient's girlfriend Marcie Bal.  Medications delivered at bedside reviewed.  Dressing supplies at bedside for patient to take home.  Both verbalized understanding to instructions.  Marcie Bal called [facility] to check on prescriptions, medications are ready for pick up.  Encouraged to call the doctor for questions.  Needs addressed.  Discharged home with hospice.

## 2021-07-31 NOTE — Plan of Care (Signed)
  Problem: Education: Goal: Knowledge of General Education information will improve Description Including pain rating scale, medication(s)/side effects and non-pharmacologic comfort measures Outcome: Progressing   Problem: Health Behavior/Discharge Planning: Goal: Ability to manage health-related needs will improve Outcome: Progressing   

## 2021-10-21 DEATH — deceased
